# Patient Record
Sex: Male | Born: 1950 | Race: White | Hispanic: No | Marital: Married | Smoking: Former smoker
Health system: Southern US, Community
[De-identification: ages and names within clinical notes are randomized; demographics above are authoritative.]

## PROBLEM LIST (undated history)

## (undated) DIAGNOSIS — G5603 Carpal tunnel syndrome, bilateral upper limbs: Secondary | ICD-10-CM

## (undated) DIAGNOSIS — I10 Essential (primary) hypertension: Secondary | ICD-10-CM

## (undated) DIAGNOSIS — I219 Acute myocardial infarction, unspecified: Secondary | ICD-10-CM

## (undated) DIAGNOSIS — R413 Other amnesia: Secondary | ICD-10-CM

## (undated) DIAGNOSIS — R414 Neurologic neglect syndrome: Secondary | ICD-10-CM

## (undated) DIAGNOSIS — F0390 Unspecified dementia without behavioral disturbance: Secondary | ICD-10-CM

## (undated) DIAGNOSIS — M4802 Spinal stenosis, cervical region: Secondary | ICD-10-CM

## (undated) DIAGNOSIS — G20A1 Parkinson's disease without dyskinesia, without mention of fluctuations: Secondary | ICD-10-CM

## (undated) DIAGNOSIS — E785 Hyperlipidemia, unspecified: Secondary | ICD-10-CM

## (undated) HISTORY — DX: Acute myocardial infarction, unspecified: I21.9

## (undated) HISTORY — DX: Other amnesia: R41.3

## (undated) HISTORY — PX: ABDOMINAL SURGERY: SHX537

## (undated) HISTORY — DX: Hyperlipidemia, unspecified: E78.5

## (undated) HISTORY — PX: CARPAL TUNNEL RELEASE: SHX101

## (undated) HISTORY — DX: Essential (primary) hypertension: I10

---

## 1998-10-19 ENCOUNTER — Encounter: Payer: Self-pay | Admitting: *Deleted

## 1998-10-19 ENCOUNTER — Observation Stay (HOSPITAL_COMMUNITY): Admission: EM | Admit: 1998-10-19 | Discharge: 1998-10-20 | Payer: Self-pay | Admitting: *Deleted

## 2000-11-21 ENCOUNTER — Encounter: Payer: Self-pay | Admitting: Emergency Medicine

## 2000-11-21 ENCOUNTER — Emergency Department (HOSPITAL_COMMUNITY): Admission: EM | Admit: 2000-11-21 | Discharge: 2000-11-21 | Payer: Self-pay

## 2003-10-28 HISTORY — PX: CARDIAC CATHETERIZATION: SHX172

## 2004-07-24 ENCOUNTER — Inpatient Hospital Stay (HOSPITAL_COMMUNITY): Admission: EM | Admit: 2004-07-24 | Discharge: 2004-07-28 | Payer: Self-pay | Admitting: Emergency Medicine

## 2004-08-12 ENCOUNTER — Encounter (HOSPITAL_COMMUNITY): Admission: RE | Admit: 2004-08-12 | Discharge: 2004-11-10 | Payer: Self-pay | Admitting: Cardiology

## 2004-08-13 ENCOUNTER — Observation Stay (HOSPITAL_COMMUNITY): Admission: EM | Admit: 2004-08-13 | Discharge: 2004-08-13 | Payer: Self-pay | Admitting: Emergency Medicine

## 2004-08-26 ENCOUNTER — Ambulatory Visit (HOSPITAL_COMMUNITY): Admission: RE | Admit: 2004-08-26 | Discharge: 2004-08-26 | Payer: Self-pay | Admitting: Cardiology

## 2004-11-11 ENCOUNTER — Encounter (HOSPITAL_COMMUNITY): Admission: RE | Admit: 2004-11-11 | Discharge: 2005-02-09 | Payer: Self-pay | Admitting: Cardiology

## 2005-12-09 ENCOUNTER — Ambulatory Visit: Payer: Self-pay | Admitting: Gastroenterology

## 2006-01-09 ENCOUNTER — Ambulatory Visit: Payer: Self-pay | Admitting: Gastroenterology

## 2006-01-19 ENCOUNTER — Ambulatory Visit (HOSPITAL_COMMUNITY): Admission: RE | Admit: 2006-01-19 | Discharge: 2006-01-19 | Payer: Self-pay | Admitting: Gastroenterology

## 2006-01-19 ENCOUNTER — Ambulatory Visit: Payer: Self-pay | Admitting: Gastroenterology

## 2006-02-03 ENCOUNTER — Ambulatory Visit (HOSPITAL_COMMUNITY): Admission: RE | Admit: 2006-02-03 | Discharge: 2006-02-03 | Payer: Self-pay | Admitting: Gastroenterology

## 2008-12-12 ENCOUNTER — Ambulatory Visit: Payer: Self-pay | Admitting: Specialist

## 2011-09-29 ENCOUNTER — Other Ambulatory Visit: Payer: Self-pay | Admitting: Cardiology

## 2011-11-19 ENCOUNTER — Other Ambulatory Visit: Payer: Self-pay | Admitting: Cardiology

## 2012-05-24 ENCOUNTER — Other Ambulatory Visit: Payer: Self-pay | Admitting: Cardiology

## 2013-02-21 ENCOUNTER — Ambulatory Visit: Payer: Self-pay | Admitting: Family Medicine

## 2013-03-25 ENCOUNTER — Other Ambulatory Visit: Payer: Self-pay | Admitting: Specialist

## 2013-03-25 LAB — BODY FLUID CELL COUNT WITH DIFFERENTIAL
Basophil: 0 %
Eosinophil: 0 %
Lymphocytes: 21 %
Neutrophils: 6 %
Nucleated Cell Count: 101 /mm3
Other Cells BF: 0 %
Other Mononuclear Cells: 73 %

## 2013-03-25 LAB — SYNOVIAL FLUID, CRYSTAL: Crystals, Joint Fluid: NONE SEEN

## 2013-03-29 LAB — BODY FLUID CULTURE

## 2013-05-02 ENCOUNTER — Ambulatory Visit: Payer: Self-pay | Admitting: Orthopedic Surgery

## 2013-11-30 ENCOUNTER — Ambulatory Visit: Payer: Self-pay | Admitting: Orthopedic Surgery

## 2013-11-30 LAB — BASIC METABOLIC PANEL
Anion Gap: 3 — ABNORMAL LOW (ref 7–16)
BUN: 13 mg/dL (ref 7–18)
Calcium, Total: 9.4 mg/dL (ref 8.5–10.1)
Chloride: 103 mmol/L (ref 98–107)
Co2: 30 mmol/L (ref 21–32)
Creatinine: 0.85 mg/dL (ref 0.60–1.30)
EGFR (African American): >60
EGFR (Non-African Amer.): >60
Glucose: 98 mg/dL (ref 65–99)
Osmolality: 272 (ref 275–301)
Potassium: 3.7 mmol/L (ref 3.5–5.1)
Sodium: 136 mmol/L (ref 136–145)

## 2013-11-30 LAB — CBC
HCT: 42.4 % (ref 40.0–52.0)
HGB: 14.5 g/dL (ref 13.0–18.0)
MCH: 31 pg (ref 26.0–34.0)
MCHC: 34.3 g/dL (ref 32.0–36.0)
MCV: 90 fL (ref 80–100)
Platelet: 171 10*3/uL (ref 150–440)
RBC: 4.69 10*6/uL (ref 4.40–5.90)
RDW: 13.2 % (ref 11.5–14.5)
WBC: 4.1 10*3/uL (ref 3.8–10.6)

## 2013-11-30 LAB — PROTIME-INR
INR: 0.9
Prothrombin Time: 11.6 secs (ref 11.5–14.7)

## 2013-11-30 LAB — APTT: Activated PTT: 26.7 secs (ref 23.6–35.9)

## 2013-12-07 ENCOUNTER — Ambulatory Visit: Payer: Self-pay | Admitting: Orthopedic Surgery

## 2014-09-06 ENCOUNTER — Ambulatory Visit (INDEPENDENT_AMBULATORY_CARE_PROVIDER_SITE_OTHER): Payer: BC Managed Care – PPO | Admitting: Family Medicine

## 2014-09-06 ENCOUNTER — Encounter: Payer: Self-pay | Admitting: Family Medicine

## 2014-09-06 VITALS — BP 150/78 | HR 78 | Temp 98.0°F | Resp 16 | Ht 67.0 in | Wt 182.0 lb

## 2014-09-06 DIAGNOSIS — N3000 Acute cystitis without hematuria: Secondary | ICD-10-CM

## 2014-09-06 DIAGNOSIS — R8290 Unspecified abnormal findings in urine: Secondary | ICD-10-CM

## 2014-09-06 DIAGNOSIS — I10 Essential (primary) hypertension: Secondary | ICD-10-CM | POA: Insufficient documentation

## 2014-09-06 DIAGNOSIS — Z23 Encounter for immunization: Secondary | ICD-10-CM

## 2014-09-06 LAB — URINALYSIS, MICROSCOPIC ONLY
Casts: NONE SEEN
Crystals: NONE SEEN
Squamous Epithelial / HPF: NONE SEEN

## 2014-09-06 LAB — URINALYSIS, ROUTINE W REFLEX MICROSCOPIC
Bilirubin Urine: NEGATIVE
Glucose, UA: NEGATIVE mg/dL
Ketones, ur: NEGATIVE mg/dL
Nitrite: NEGATIVE
Protein, ur: NEGATIVE mg/dL
Specific Gravity, Urine: 1.01 (ref 1.005–1.030)
Urobilinogen, UA: 0.2 mg/dL (ref 0.0–1.0)
pH: 5.5 (ref 5.0–8.0)

## 2014-09-06 MED ORDER — CIPROFLOXACIN HCL 500 MG PO TABS: 500.0000 mg | ORAL_TABLET | Freq: Two times a day (BID) | ORAL | Status: DC

## 2014-09-06 NOTE — Addendum Note (Signed)
Addended by: Sheral Flow on: 09/06/2014 04:24 PM   Modules accepted: Orders

## 2014-09-06 NOTE — Progress Notes (Signed)
Patient ID: Jay Rivas, male   DOB: September 14, 1951, 63 y.o.   MRN: [id number]   Subjective:    Patient ID: Jay Rivas, male    DOB: May 25, 1951, 63 y.o.   MRN: [id number]  Patient presents for Possible UTI  Patient here with foul-smelling urine and changing color for the past 3 months on and off. He is not [place] office in quite a few years. He is followed by cardiology Dr. Nevada Crane one he. He denies any urinary pressure but he does have BPH states that he has had his prostate screening by urology. He does not take any medications for this. He has not seen any gross blood. He has no history of kidney stones. No abdominal pain or fever associated. He did try some over-the-counter bladder tablets.   Review Of Systems:  GEN- denies fatigue, fever, weight loss,weakness, recent illness HEENT- denies eye drainage, change in vision, nasal discharge, CVS- denies chest pain, palpitations RESP- denies SOB, cough, wheeze ABD- denies N/V, change in stools, abd pain GU- denies dysuria, hematuria, dribbling, incontinence MSK- denies joint pain, muscle aches, injury Neuro- denies headache, dizziness, syncope, seizure activity       Objective:    BP 150/78 mmHg  Pulse 78  Temp(Src) 98 F (36.7 C) (Oral)  Resp 16  Ht 5\' 7"  (1.702 m)  Wt 182 lb (82.555 kg)  BMI 28.50 kg/m2 GEN- NAD, alert and oriented x3, repeat BP 152/82 HEENT- PERRL, EOMI, non injected sclera, pink conjunctiva, MMM, oropharynx clear CVS- RRR, no murmur RESP-CTAB ABD-NABS,soft,NT,ND , no CVA tenderness         Assessment & Plan:      Problem List Items Addressed This Visit    None    Visit Diagnoses    Acute cystitis without hematuria    -  Primary    possible acute cystitis however with BPH could be component of prostatitis. Send urine for culture, start Cipro, small blood noted microscopically    Relevant Orders       Urine culture    Foul smelling urine        Relevant Orders       Urinalysis, Routine w reflex  microscopic (Completed)       Note: This dictation was prepared with Dragon dictation along with smaller phrase technology. Any transcriptional errors that result from this process are unintentional.

## 2014-09-06 NOTE — Assessment & Plan Note (Signed)
Elevated BP today, seen at cardiologist office recently BP was normal, he will monitor at home Obtain records from his cardiologist and last set of labs, I will not change meds today

## 2014-09-06 NOTE — Patient Instructions (Signed)
Take antibiotics as prescribed We will call with culture results  Release of records from Dr. Terrence Dupont  F/U as needed

## 2014-09-08 LAB — URINE CULTURE: Colony Count: >=100000

## 2015-02-17 NOTE — Op Note (Signed)
PATIENT NAME:  Jay Rivas, LANGILLE MR#:  [id number] DATE OF BIRTH:  1951/06/16  DATE OF PROCEDURE:  12/07/2013  PREOPERATIVE DIAGNOSIS: Right carpal tunnel syndrome.   POSTOPERATIVE DIAGNOSIS: Right carpal tunnel syndrome.   PROCEDURE: Right open carpal tunnel release.   ANESTHESIA: General.   SURGEON: [place] Park, M.D.   ESTIMATED BLOOD LOSS: Minimal.   COMPLICATIONS: None.   TOURNIQUET TIME: 39 minutes.   INDICATIONS FOR PROCEDURE: The patient is a 64 year old male who has had significant pain, numbness in the thumb, index and middle fingers, along with weakness of the right hand. He was confirmed to have carpal tunnel syndrome by nerve conduction study. He failed conservative management, including splinting, nonsteroidal anti-inflammatories and corticosteroid injection. The patient wished to proceed with surgery. He understood the risks included infection, bleeding, wound healing problems, nerve injury, recurrence of carpal tunnel syndrome, wrist stiffness, pillar pain and the need for further surgery. Medical risks he understood include myocardial infarction, stroke, pneumonia, respiratory failure and death. The patient understood that the reason to proceed with surgery was to avoid further deterioration of the condition of the median nerve. He understood that I could not guarantee full return of sensation and strength to the right hand.   PROCEDURE NOTE: The patient was met in the preoperative area. I marked the right wrist with the word yes according to the hospital's right site protocol. This was after verbally confirming with the patient that this was the correct side of surgery. The patient's wife was by the bedside. I answered all of his questions and updated his history and physical. The patient was then brought to the operating room where he was placed supine on the operative table. He underwent general anesthesia. His right arm was then prepped and draped in sterile fashion. A timeout  was performed to verify the patient's name, date of birth, medical record number, correct site of surgery and correct procedure to be performed. It was also used to confirm that the patient had received antibiotics and all appropriate instruments, implants and radiographic studies were available in the room. Once all in attendance were in agreement, the case began.   The patient had his right arm Esmarched. A tourniquet had been applied to his right upper arm. This was inflated to 250 mmHg. The patient then had a towel roll placed under his right wrist. A lead hand was used to keep his fingers into extension. Kaplan's cardinal line along with a line extended from the web space to the middle and ring fingers were drawn on this patient's palm. This allowed for surgical incision planning. His palmar crease was incorporated into the incision made. Once the incision was drawn out, a #15 blade was used to make the skin incision. The subcutaneous tissues were then carefully dissected using a small Metzenbaum scissors and an Adson pick-up. The distal edge of the transverse carpal ligament was then identified along with the palmar fascia. A Freer elevator was placed underneath the transverse carpal ligament running distally to proximally. A micro Beaver blade was then used to carefully incise the transverse carpal ligament, taking care to stay on top of the Soil scientist. A small self-retaining retractor had been placed in the incision to hold open the skin edges for better visualization of the transverse carpal ligament. Once the transverse carpal ligament was incised, both the proximal and distal edges of the ligament were easily visualized and were completely released. The median nerve was inspected and found have a slight indentation from where the transverse  carpal ligament had been compressing it. Otherwise, the nerve was in good condition. Care was taken to watch for the recurrent motor branch of the median nerve  during release of the transverse carpal ligament. The recurrent motor branch was not seen to be exiting through the transverse carpal ligament during the case. The wound was then copiously irrigated. The skin was approximated using 4-0 nylon in an interrupted fashion. A dry sterile dressing was applied. Fluffs were placed between all fingers. A volar splint was then placed on the palmar side of the wrist and the entire bandage was covered with a bias cut bandage. The patient was then awakened and brought to the PACU in stable condition. I was scrubbed and present for the entire case, and all sharp and instrument counts were correct at the conclusion of the case. I spoke with the patient's wife in the postop consultation room to let her know the case had gone without complication and the patient was stable in the recovery room. The patient had the ability to flex and extend all 5 digits. His fingers were well perfused, and he had intact sensation in all 5 digits to light touch. He could flex his FPL, FDS and FDP in all digits.    ____________________________ Timoteo Gaul, MD klk:gb D: 12/07/2013 22:20:33 ET T: 12/07/2013 22:59:44 ET JOB#: [id number]  cc: Timoteo Gaul, MD, <Dictator> Timoteo Gaul MD ELECTRONICALLY SIGNED 12/08/2013 10:44

## 2015-12-19 ENCOUNTER — Encounter: Payer: Self-pay | Admitting: Gastroenterology

## 2016-08-29 ENCOUNTER — Encounter: Payer: Self-pay | Admitting: Family Medicine

## 2016-08-29 ENCOUNTER — Other Ambulatory Visit: Payer: Self-pay | Admitting: Family Medicine

## 2016-08-29 ENCOUNTER — Ambulatory Visit (INDEPENDENT_AMBULATORY_CARE_PROVIDER_SITE_OTHER): Payer: BLUE CROSS/BLUE SHIELD | Admitting: Family Medicine

## 2016-08-29 ENCOUNTER — Ambulatory Visit (HOSPITAL_COMMUNITY)
Admission: RE | Admit: 2016-08-29 | Discharge: 2016-08-29 | Disposition: A | Discharge status: Home / Self Care | Payer: BLUE CROSS/BLUE SHIELD | Source: Ambulatory Visit | Attending: Family Medicine | Admitting: Family Medicine

## 2016-08-29 VITALS — BP 128/74 | HR 72 | Temp 98.0°F | Resp 14 | Ht 67.0 in | Wt 178.0 lb

## 2016-08-29 DIAGNOSIS — N5082 Scrotal pain: Secondary | ICD-10-CM | POA: Diagnosis not present

## 2016-08-29 DIAGNOSIS — N5089 Other specified disorders of the male genital organs: Secondary | ICD-10-CM | POA: Diagnosis not present

## 2016-08-29 DIAGNOSIS — N432 Other hydrocele: Secondary | ICD-10-CM | POA: Diagnosis not present

## 2016-08-29 DIAGNOSIS — K4090 Unilateral inguinal hernia, without obstruction or gangrene, not specified as recurrent: Secondary | ICD-10-CM | POA: Diagnosis not present

## 2016-08-29 DIAGNOSIS — N451 Epididymitis: Secondary | ICD-10-CM

## 2016-08-29 DIAGNOSIS — N390 Urinary tract infection, site not specified: Secondary | ICD-10-CM

## 2016-08-29 DIAGNOSIS — N433 Hydrocele, unspecified: Secondary | ICD-10-CM | POA: Diagnosis not present

## 2016-08-29 LAB — URINALYSIS, ROUTINE W REFLEX MICROSCOPIC
Bilirubin Urine: NEGATIVE
Glucose, UA: NEGATIVE
Nitrite: NEGATIVE
Specific Gravity, Urine: 1.02 (ref 1.001–1.035)
pH: 5.5 (ref 5.0–8.0)

## 2016-08-29 LAB — URINALYSIS, MICROSCOPIC ONLY
Crystals: NONE SEEN [HPF]
Yeast: NONE SEEN [HPF]

## 2016-08-29 MED ORDER — CIPROFLOXACIN HCL 500 MG PO TABS: 500.0000 mg | ORAL_TABLET | Freq: Two times a day (BID) | ORAL | 0 refills | Status: DC

## 2016-08-29 MED ORDER — LEVOFLOXACIN 500 MG PO TABS: 500.0000 mg | ORAL_TABLET | Freq: Every day | ORAL | 0 refills | Status: DC

## 2016-08-29 NOTE — Patient Instructions (Signed)
Take antibiotics as prescribed Ultrasound to be done  F/U pending results

## 2016-08-29 NOTE — Progress Notes (Signed)
   Subjective:    Patient ID: Jay Rivas, male    DOB: 09-Jan-1951, 65 y.o.   MRN: [id number]  Patient presents for Testicular Edema (x1 week- R testicle swelling- reports that he has been taking OTC pain reliver- had illness last week- states that he does have some mild painin inguinal area on R side) and Dysuria (x1 week- reports slight pain with urination) Patient here with testicular swelling that started 4 days ago. He states that he did lift a heavy motor up into her truck but he does not really pain at that time. He has had some discomfort right into the right groin region. The past few days he then started having some burning with urination denies any discharge from the penis. He took over-the-counter pain relievers which is helped the pain in his right scrotal area. Denies any hematuria  He does occasional strain with BM   No previous scrotal swelling   Review Of Systems: per above   GEN- denies fatigue, fever, weight loss,weakness, recent illness HEENT- denies eye drainage, change in vision, nasal discharge, CVS- denies chest pain, palpitations RESP- denies SOB, cough, wheeze ABD- denies N/V, change in stools, abd pain GU-+dysuria, denies hematuria, dribbling, incontinence MSK- denies joint pain, muscle aches, injury Neuro- denies headache, dizziness, syncope, seizure activity       Objective:    BP 128/74 (BP Location: Left Arm, Patient Position: Sitting, Cuff Size: Normal)   Pulse 72   Temp 98 F (36.7 C) (Oral)   Resp 14   Ht 5\' 7"  (1.702 m)   Wt 178 lb (80.7 kg)   SpO2 98%   BMI 27.88 kg/m  GEN- NAD, alert and oriented x3 HEENT- PERRL, EOMI, non injected sclera, pink conjunctiva, MMM, oropharynx clear Neck- Supple, no thyromegaly CVS- RRR, no murmur RESP-CTAB ABD-NABS,soft,NT,ND, no CVA tenderness GU- no penile discharge, large swelling right scrotum, testes descended bilat, swelling into right mons pubis , TTP  Pulses- Radial  2+        Assessment &  Plan:      Problem List Items Addressed This Visit    None    Visit Diagnoses    Urinary tract infection without hematuria, site unspecified    -  Primary   Relevant Orders   Urinalysis, Routine w reflex microscopic (not at [facility] (Completed)   Urine culture   Scrotal swelling       DD include infection/ torison, inguinal hernia with swelling in mons pubis and inguinal canal region. Obtain STAT Ultrasound, plan to start Cipro.    [place] reviewed epidydymitis/orchitis and hydrocele seen.   Take antibiotisc, culture sent, schedule with urology for the large hydrocele    Relevant Orders   US Scrotum (Completed)   Right inguinal hernia       Relevant Orders   US Scrotum (Completed)    Epidydymitis - seen on [place]    Note: This dictation was prepared with Dragon dictation along with smaller phrase technology. Any transcriptional errors that result from this process are unintentional.

## 2016-09-01 LAB — URINE CULTURE

## 2016-09-02 ENCOUNTER — Telehealth: Payer: Self-pay

## 2016-09-02 NOTE — Telephone Encounter (Signed)
PT called to confirm referral was put in. Explained to pt referral was put in 11-3

## 2016-09-03 ENCOUNTER — Telehealth: Payer: Self-pay | Admitting: Family Medicine

## 2016-09-03 NOTE — Telephone Encounter (Signed)
Patient calling to check the status of his urology referral  [phone]

## 2016-09-04 NOTE — Telephone Encounter (Signed)
I did call [facility].  They do have referral and will be contacting patient to make referral.  LMTCB

## 2017-08-28 DIAGNOSIS — I2510 Atherosclerotic heart disease of native coronary artery without angina pectoris: Secondary | ICD-10-CM | POA: Diagnosis not present

## 2017-08-28 DIAGNOSIS — I252 Old myocardial infarction: Secondary | ICD-10-CM | POA: Diagnosis not present

## 2017-08-28 DIAGNOSIS — I10 Essential (primary) hypertension: Secondary | ICD-10-CM | POA: Diagnosis not present

## 2017-08-28 DIAGNOSIS — E785 Hyperlipidemia, unspecified: Secondary | ICD-10-CM | POA: Diagnosis not present

## 2017-08-28 DIAGNOSIS — R7309 Other abnormal glucose: Secondary | ICD-10-CM | POA: Diagnosis not present

## 2017-09-29 ENCOUNTER — Encounter: Payer: Self-pay | Admitting: Family Medicine

## 2017-09-29 ENCOUNTER — Ambulatory Visit (INDEPENDENT_AMBULATORY_CARE_PROVIDER_SITE_OTHER): Payer: Medicare Other | Admitting: Family Medicine

## 2017-09-29 VITALS — BP 130/86 | HR 78 | Temp 97.9°F | Resp 12 | Ht 67.0 in | Wt 178.0 lb

## 2017-09-29 DIAGNOSIS — R4789 Other speech disturbances: Secondary | ICD-10-CM | POA: Diagnosis not present

## 2017-09-29 DIAGNOSIS — R2689 Other abnormalities of gait and mobility: Secondary | ICD-10-CM

## 2017-09-29 DIAGNOSIS — R413 Other amnesia: Secondary | ICD-10-CM

## 2017-09-29 NOTE — Progress Notes (Signed)
Subjective:    Patient ID: Jay Rivas, male    DOB: Mar 14, 1951, 66 y.o.   MRN: [id number]  HPI Patient is a very pleasant 66 year old Caucasian male with a past medical history of coronary artery disease status post myocardial infarction in the remote past as well as hypertension who presents today with his wife for memory problems.  There seems to have been a sudden change approximately 4 months ago.  Around that time, the patient and the patient's wife noticed word finding difficulties while speaking.  He will frequently forget or be unable to complete his sentences.  He will know what he wants to say but have a difficult time saying the word.  He also reports a difficulty naming objects and forgetting the name of common items.  Around the same time, the patient has noticed generalized memory problems.  He frequently forgets items loses his keys, loses wallet.  He is still able to manage his activities of daily living and independent activities of daily living.  He denies any episodes of forgetting to turn off the stove or forgetting to pay a bill..  However there have been also a other neurologic symptom.  He is experience myoclonic jerks in his right upper extremities.  He is lost the dexterity in his right hand.  He has been diagnosed with carpal tunnel syndrome and is even had surgery for this however he has not regained the strength in his right hand and reports numbness and tingling in all of his fingertips on the right hand along with weakness in the right arm.  On neurologic exam today, cranial nerves II through XII are grossly intact with muscle strength 5/5 equal and symmetric in all extremities except for the right arm.  In the right arm, he has lost the fine motor control in his right hand and a sensation in his right hand.  He also has some mild weakness with elbow flexion and extension.  On cerebellar testing, the patient has a difficult time with finger to nose testing.  He also has a  difficult time with heel to toe gait testing.  Mini-Mental status exam was performed today in the office.  Patient incorrectly states that the year is 2009.  He is able to correctly name the month but not the day of the week nor the date.  He is able to score 5 out of 5 in location.  After 3 chances, the patient is able to recall 3 items.  5 minutes later, he is only able to recall 2 out of the 3 items.  He is unable to perform serial sevens and does not even attempt to perform serial sevens.  He is also unable to spell world in reverse.  On clock drawing, the patient is unable to draw all a clock face with the correct time.  Furthermore he has a noticeable essential tremor with a very misshapen clock face due to the tremor.  He is also unable to look at a clock that I have drawn for him and tell me the correct time.  Patient has 1/10 grade education and worked in Architect.  This is a noticeable decline in his baseline mental status. Past Medical History:  Diagnosis Date  . Hypertension   . Myocardial infarction [facility])    Dr. Terrence Dupont   No past surgical history on file. Current Outpatient Medications on File Prior to Visit  Medication Sig Dispense Refill  . aspirin EC 81 MG tablet Take 81 mg by  mouth daily.    Marland Kitchen atorvastatin (LIPITOR) 10 MG tablet   1  . losartan (COZAAR) 25 MG tablet Take 25 mg by mouth daily.    . metoprolol succinate (TOPROL-XL) 25 MG 24 hr tablet   1   No current facility-administered medications on file prior to visit.    No Known Allergies Social History   Socioeconomic History  . Marital status: Married    Spouse name: Not on file  . Number of children: Not on file  . Years of education: Not on file  . Highest education level: Not on file  Social Needs  . Financial resource strain: Not on file  . Food insecurity - worry: Not on file  . Food insecurity - inability: Not on file  . Transportation needs - medical: Not on file  . Transportation needs - non-medical:  Not on file  Occupational History  . Not on file  Tobacco Use  . Smoking status: Former Research scientist (life sciences)  . Smokeless tobacco: Former Systems developer  . Tobacco comment: quit 1990  Substance and Sexual Activity  . Alcohol use: Yes    Alcohol/week: 14.4 oz    Types: 24 Cans of beer per week  . Drug use: No  . Sexual activity: Yes  Other Topics Concern  . Not on file  Social History Narrative  . Not on file   No family history on file.     Review of Systems  All other systems reviewed and are negative.      Objective:   Physical Exam  Constitutional: He is oriented to person, place, and time. He appears well-developed and well-nourished. No distress.  HENT:  Head: Normocephalic and atraumatic.  Right Ear: External ear normal.  Left Ear: External ear normal.  Nose: Nose normal.  Mouth/Throat: Oropharynx is clear and moist. No oropharyngeal exudate.  Eyes: Conjunctivae and EOM are normal. Pupils are equal, round, and reactive to light. Right eye exhibits no discharge. Left eye exhibits no discharge. No scleral icterus.  Neck: Normal range of motion. Neck supple. No JVD present. No thyromegaly present.  Cardiovascular: Normal rate, regular rhythm, normal heart sounds and intact distal pulses. Exam reveals no gallop and no friction rub.  No murmur heard. Pulmonary/Chest: Effort normal and breath sounds normal. No respiratory distress. He has no wheezes. He has no rales. He exhibits no tenderness.  Abdominal: Soft. Bowel sounds are normal. He exhibits no distension and no mass. There is no tenderness. There is no rebound and no guarding.  Lymphadenopathy:    He has no cervical adenopathy.  Neurological: He is alert and oriented to person, place, and time. He has normal reflexes. He displays normal reflexes. No cranial nerve deficit. He exhibits abnormal muscle tone. Coordination abnormal.  Skin: He is not diaphoretic.  Psychiatric: He has a normal mood and affect. His behavior is normal. Judgment  and thought content normal.  Vitals reviewed.         Assessment & Plan:  Memory loss - Plan: Vitamin B12, TSH, COMPLETE METABOLIC PANEL WITH GFR, CBC with Differential/Platelet, MR Brain W Wo Contrast  Word finding difficulty - Plan: MR Brain W Wo Contrast  Impairment of balance - Plan: MR Brain W Wo Contrast  Patient has noticed a noticeable decline in his mental status over the last 4 months.  Given the word finding difficulties and speech difficulties, I am concerned about possible ischemic stroke in the left hemisphere.  I am also concerned about possible ischemic stroke or underlying cerebral  pathology in the left hemisphere due to the loss of dexterity in his right hand, the weakness in his right arm, along with myoclonic jerks in his right arm.  Therefore I am not comfortable simply calling this dementia/Alzheimer's dementia.  I believe a more thorough diagnostic workup needs to take place to rule out underlying vascular issues in the brain particularly given his previous history of myocardial infarction.  Therefore I will schedule the patient for an MRI of the brain.  I will also check a vitamin B12 level, TSH, CMP, and a CBC.  I will await the results of his lab studies along with MRI prior to determining the next course of treatment between medication such as Aricept and/or Namenda versus focusing on secondary prevention of future CVA.

## 2017-09-30 LAB — CBC WITH DIFFERENTIAL/PLATELET
Basophils Absolute: 30 cells/uL (ref 0–200)
Basophils Relative: 0.6 %
Eosinophils Absolute: 280 cells/uL (ref 15–500)
Eosinophils Relative: 5.6 %
HCT: 38.1 % — ABNORMAL LOW (ref 38.5–50.0)
Hemoglobin: 13.6 g/dL (ref 13.2–17.1)
Lymphs Abs: 795 cells/uL — ABNORMAL LOW (ref 850–3900)
MCH: 31.1 pg (ref 27.0–33.0)
MCHC: 35.7 g/dL (ref 32.0–36.0)
MCV: 87.2 fL (ref 80.0–100.0)
MPV: 9.4 fL (ref 7.5–12.5)
Monocytes Relative: 7.6 %
Neutro Abs: 3515 cells/uL (ref 1500–7800)
Neutrophils Relative %: 70.3 %
Platelets: 189 10*3/uL (ref 140–400)
RBC: 4.37 10*6/uL (ref 4.20–5.80)
RDW: 12.8 % (ref 11.0–15.0)
Total Lymphocyte: 15.9 %
WBC mixed population: 380 cells/uL (ref 200–950)
WBC: 5 10*3/uL (ref 3.8–10.8)

## 2017-09-30 LAB — COMPLETE METABOLIC PANEL WITH GFR
AG Ratio: 2 (calc) (ref 1.0–2.5)
ALT: 24 U/L (ref 9–46)
AST: 23 U/L (ref 10–35)
Albumin: 4.6 g/dL (ref 3.6–5.1)
Alkaline phosphatase (APISO): 61 U/L (ref 40–115)
BUN: 11 mg/dL (ref 7–25)
CO2: 28 mmol/L (ref 20–32)
Calcium: 9.8 mg/dL (ref 8.6–10.3)
Chloride: 101 mmol/L (ref 98–110)
Creat: 0.92 mg/dL (ref 0.70–1.25)
GFR, Est African American: 101 mL/min/{1.73_m2} (ref >=60)
GFR, Est Non African American: 87 mL/min/{1.73_m2} (ref >=60)
Globulin: 2.3 g/dL (calc) (ref 1.9–3.7)
Glucose, Bld: 93 mg/dL (ref 65–99)
Potassium: 3.9 mmol/L (ref 3.5–5.3)
Sodium: 140 mmol/L (ref 135–146)
Total Bilirubin: 0.8 mg/dL (ref 0.2–1.2)
Total Protein: 6.9 g/dL (ref 6.1–8.1)

## 2017-09-30 LAB — VITAMIN B12: Vitamin B-12: 342 pg/mL (ref 200–1100)

## 2017-09-30 LAB — TSH: TSH: 1.92 mIU/L (ref 0.40–4.50)

## 2017-10-08 ENCOUNTER — Ambulatory Visit
Admission: RE | Admit: 2017-10-08 | Discharge: 2017-10-08 | Disposition: A | Discharge status: Home / Self Care | Payer: Medicare Other | Source: Ambulatory Visit | Attending: Family Medicine | Admitting: Family Medicine

## 2017-10-08 DIAGNOSIS — R2689 Other abnormalities of gait and mobility: Secondary | ICD-10-CM

## 2017-10-08 DIAGNOSIS — R413 Other amnesia: Secondary | ICD-10-CM

## 2017-10-08 DIAGNOSIS — R410 Disorientation, unspecified: Secondary | ICD-10-CM | POA: Diagnosis not present

## 2017-10-08 DIAGNOSIS — R4789 Other speech disturbances: Secondary | ICD-10-CM

## 2017-10-08 MED ORDER — GADOBENATE DIMEGLUMINE 529 MG/ML IV SOLN
16.0000 mL | Freq: Once | INTRAVENOUS | Status: AC | PRN
  Administered 2017-10-08: 16 mL via INTRAVENOUS

## 2017-10-14 DIAGNOSIS — M65331 Trigger finger, right middle finger: Secondary | ICD-10-CM | POA: Diagnosis not present

## 2017-10-14 DIAGNOSIS — M542 Cervicalgia: Secondary | ICD-10-CM | POA: Diagnosis not present

## 2017-10-14 DIAGNOSIS — M65351 Trigger finger, right little finger: Secondary | ICD-10-CM | POA: Diagnosis not present

## 2017-10-14 DIAGNOSIS — G5603 Carpal tunnel syndrome, bilateral upper limbs: Secondary | ICD-10-CM | POA: Diagnosis not present

## 2017-10-14 DIAGNOSIS — M65341 Trigger finger, right ring finger: Secondary | ICD-10-CM | POA: Insufficient documentation

## 2017-10-22 ENCOUNTER — Encounter: Payer: Self-pay | Admitting: Family Medicine

## 2017-10-22 ENCOUNTER — Ambulatory Visit (INDEPENDENT_AMBULATORY_CARE_PROVIDER_SITE_OTHER): Payer: Medicare Other | Admitting: Family Medicine

## 2017-10-22 VITALS — BP 144/90 | HR 70 | Temp 98.1°F | Resp 14 | Ht 67.0 in | Wt 174.0 lb

## 2017-10-22 DIAGNOSIS — F0390 Unspecified dementia without behavioral disturbance: Secondary | ICD-10-CM | POA: Diagnosis not present

## 2017-10-22 MED ORDER — DONEPEZIL HCL 5 MG PO TABS: 5.0000 mg | ORAL_TABLET | Freq: Every day | ORAL | 5 refills | Status: DC

## 2017-10-22 NOTE — Progress Notes (Signed)
Subjective:    Patient ID: Jay Rivas, male    DOB: 1950/11/14, 66 y.o.   MRN: [id number]  HPI  09/29/17 Patient is a very pleasant 66 year old Caucasian male with a past medical history of coronary artery disease status post myocardial infarction in the remote past as well as hypertension who presents today with his wife for memory problems.  There seems to have been a sudden change approximately 4 months ago.  Around that time, the patient and the patient's wife noticed word finding difficulties while speaking.  He will frequently forget or be unable to complete his sentences.  He will know what he wants to say but have a difficult time saying the word.  He also reports a difficulty naming objects and forgetting the name of common items.  Around the same time, the patient has noticed generalized memory problems.  He frequently forgets items loses his keys, loses wallet.  He is still able to manage his activities of daily living and independent activities of daily living.  He denies any episodes of forgetting to turn off the stove or forgetting to pay a bill..  However there have been also a other neurologic symptom.  He is experience myoclonic jerks in his right upper extremities.  He is lost the dexterity in his right hand.  He has been diagnosed with carpal tunnel syndrome and is even had surgery for this however he has not regained the strength in his right hand and reports numbness and tingling in all of his fingertips on the right hand along with weakness in the right arm.  On neurologic exam today, cranial nerves II through XII are grossly intact with muscle strength 5/5 equal and symmetric in all extremities except for the right arm.  In the right arm, he has lost the fine motor control in his right hand and a sensation in his right hand.  He also has some mild weakness with elbow flexion and extension.  On cerebellar testing, the patient has a difficult time with finger to nose testing.  He also  has a difficult time with heel to toe gait testing.  Mini-Mental status exam was performed today in the office.  Patient incorrectly states that the year is 2009.  He is able to correctly name the month but not the day of the week nor the date.  He is able to score 5 out of 5 in location.  After 3 chances, the patient is able to recall 3 items.  5 minutes later, he is only able to recall 2 out of the 3 items.  He is unable to perform serial sevens and does not even attempt to perform serial sevens.  He is also unable to spell world in reverse.  On clock drawing, the patient is unable to draw all a clock face with the correct time.  Furthermore he has a noticeable essential tremor with a very misshapen clock face due to the tremor.  He is also unable to look at a clock that I have drawn for him and tell me the correct time.  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  This is a noticeable decline in his baseline mental status.  At that time, my plan was: Patient has noticed a noticeable decline in his mental status over the last 4 months.  Given the word finding difficulties and speech difficulties, I am concerned about possible ischemic stroke in the left hemisphere.  I am also concerned about possible ischemic  stroke or underlying cerebral pathology in the left hemisphere due to the loss of dexterity in his right hand, the weakness in his right arm, along with myoclonic jerks in his right arm.  Therefore I am not comfortable simply calling this dementia/Alzheimer's dementia.  I believe a more thorough diagnostic workup needs to take place to rule out underlying vascular issues in the brain particularly given his previous history of myocardial infarction.  Therefore I will schedule the patient for an MRI of the brain.  I will also check a vitamin B12 level, TSH, CMP, and a CBC.  I will await the results of his lab studies along with MRI prior to determining the next course of treatment between  medication such as Aricept and/or Namenda versus focusing on secondary prevention of future CVA.  10/22/17 Lab work showed a normal TSH. It showed a normal vitamin B12 level. MRI revealed moderate generalized atrophy of the brain but no evidence of stroke, hemorrhage, or brain tumor. Workup to date has been concerning for dementia, most likely Alzheimer's type. They're here today to discuss further Past Medical History:  Diagnosis Date  . Hypertension   . Myocardial infarction [facility])    Dr. Terrence Dupont   No past surgical history on file. Current Outpatient Medications on File Prior to Visit  Medication Sig Dispense Refill  . aspirin EC 81 MG tablet Take 81 mg by mouth daily.    Marland Kitchen atorvastatin (LIPITOR) 10 MG tablet   1  . losartan (COZAAR) 25 MG tablet Take 25 mg by mouth daily.    . metoprolol succinate (TOPROL-XL) 25 MG 24 hr tablet   1   No current facility-administered medications on file prior to visit.    No Known Allergies Social History   Socioeconomic History  . Marital status: Married    Spouse name: Not on file  . Number of children: Not on file  . Years of education: Not on file  . Highest education level: Not on file  Social Needs  . Financial resource strain: Not on file  . Food insecurity - worry: Not on file  . Food insecurity - inability: Not on file  . Transportation needs - medical: Not on file  . Transportation needs - non-medical: Not on file  Occupational History  . Not on file  Tobacco Use  . Smoking status: Former Research scientist (life sciences)  . Smokeless tobacco: Former Systems developer  . Tobacco comment: quit 1990  Substance and Sexual Activity  . Alcohol use: Yes    Alcohol/week: 14.4 oz    Types: 24 Cans of beer per week  . Drug use: No  . Sexual activity: Yes  Other Topics Concern  . Not on file  Social History Narrative  . Not on file   No family history on file.     Review of Systems  All other systems reviewed and are negative.      Objective:   Physical Exam    Constitutional: He is oriented to person, place, and time. He appears well-developed and well-nourished. No distress.  HENT:  Head: Normocephalic and atraumatic.  Right Ear: External ear normal.  Left Ear: External ear normal.  Nose: Nose normal.  Mouth/Throat: Oropharynx is clear and moist. No oropharyngeal exudate.  Eyes: Conjunctivae and EOM are normal. Pupils are equal, round, and reactive to light. Right eye exhibits no discharge. Left eye exhibits no discharge. No scleral icterus.  Neck: Normal range of motion. Neck supple. No JVD present. No thyromegaly present.  Cardiovascular: Normal  rate, regular rhythm, normal heart sounds and intact distal pulses. Exam reveals no gallop and no friction rub.  No murmur heard. Pulmonary/Chest: Effort normal and breath sounds normal. No respiratory distress. He has no wheezes. He has no rales. He exhibits no tenderness.  Abdominal: Soft. Bowel sounds are normal. He exhibits no distension and no mass. There is no tenderness. There is no rebound and no guarding.  Lymphadenopathy:    He has no cervical adenopathy.  Neurological: He is alert and oriented to person, place, and time. He has normal reflexes. No cranial nerve deficit. He exhibits abnormal muscle tone. Coordination abnormal.  Skin: He is not diaphoretic.  Psychiatric: He has a normal mood and affect. His behavior is normal. Judgment and thought content normal.  Vitals reviewed.         Assessment & Plan:  Dementia without behavioral disturbance, unspecified dementia type - Plan: donepezil (ARICEPT) 5 MG tablet, Ambulatory referral to Neurology  I have recommended Aricept 5 mg a day and recheck in one month. I suspect that he has early signs of Alzheimer's dementia. Patient's family would like to follow-up with neurology for a second opinion which I will happily arrange. I would recommend increasing the dose of Aricept as tolerated and memory loss worsens, adding Namenda to prevent  further deterioration as long as possible. Spent more than 20 minutes with the family today discussing this. I will gladly consult neurology for a second opinion per their request

## 2017-10-30 DIAGNOSIS — G5623 Lesion of ulnar nerve, bilateral upper limbs: Secondary | ICD-10-CM | POA: Diagnosis not present

## 2017-10-30 DIAGNOSIS — M65331 Trigger finger, right middle finger: Secondary | ICD-10-CM | POA: Diagnosis not present

## 2017-10-30 DIAGNOSIS — M5412 Radiculopathy, cervical region: Secondary | ICD-10-CM | POA: Diagnosis not present

## 2017-10-30 DIAGNOSIS — G8929 Other chronic pain: Secondary | ICD-10-CM | POA: Diagnosis not present

## 2017-10-30 DIAGNOSIS — M65351 Trigger finger, right little finger: Secondary | ICD-10-CM | POA: Diagnosis not present

## 2017-10-30 DIAGNOSIS — G5603 Carpal tunnel syndrome, bilateral upper limbs: Secondary | ICD-10-CM | POA: Diagnosis not present

## 2017-10-30 DIAGNOSIS — M65341 Trigger finger, right ring finger: Secondary | ICD-10-CM | POA: Diagnosis not present

## 2017-11-18 DIAGNOSIS — R7309 Other abnormal glucose: Secondary | ICD-10-CM | POA: Diagnosis not present

## 2017-11-18 DIAGNOSIS — I10 Essential (primary) hypertension: Secondary | ICD-10-CM | POA: Diagnosis not present

## 2017-11-18 DIAGNOSIS — I252 Old myocardial infarction: Secondary | ICD-10-CM | POA: Diagnosis not present

## 2017-11-18 DIAGNOSIS — I2510 Atherosclerotic heart disease of native coronary artery without angina pectoris: Secondary | ICD-10-CM | POA: Diagnosis not present

## 2017-11-18 DIAGNOSIS — E785 Hyperlipidemia, unspecified: Secondary | ICD-10-CM | POA: Diagnosis not present

## 2017-11-20 ENCOUNTER — Encounter: Payer: Self-pay | Admitting: Family Medicine

## 2017-11-20 ENCOUNTER — Ambulatory Visit (INDEPENDENT_AMBULATORY_CARE_PROVIDER_SITE_OTHER): Payer: Medicare Other | Admitting: Family Medicine

## 2017-11-20 VITALS — BP 124/68 | HR 60 | Temp 98.3°F | Resp 14 | Wt 178.0 lb

## 2017-11-20 DIAGNOSIS — G5603 Carpal tunnel syndrome, bilateral upper limbs: Secondary | ICD-10-CM | POA: Diagnosis not present

## 2017-11-20 DIAGNOSIS — R4789 Other speech disturbances: Secondary | ICD-10-CM | POA: Diagnosis not present

## 2017-11-20 DIAGNOSIS — F0390 Unspecified dementia without behavioral disturbance: Secondary | ICD-10-CM | POA: Diagnosis not present

## 2017-11-20 DIAGNOSIS — M542 Cervicalgia: Secondary | ICD-10-CM | POA: Diagnosis not present

## 2017-11-20 MED ORDER — DONEPEZIL HCL 10 MG PO TABS: 10.0000 mg | ORAL_TABLET | Freq: Every day | ORAL | 5 refills | Status: DC

## 2017-11-20 NOTE — Progress Notes (Signed)
Subjective:    Patient ID: Jay Rivas, male    DOB: 1950/11/14, 67 y.o.   MRN: [id number]  HPI  09/29/17 Patient is a very pleasant 67 year old Caucasian male with a past medical history of coronary artery disease status post myocardial infarction in the remote past as well as hypertension who presents today with his wife for memory problems.  There seems to have been a sudden change approximately 4 months ago.  Around that time, the patient and the patient's wife noticed word finding difficulties while speaking.  He will frequently forget or be unable to complete his sentences.  He will know what he wants to say but have a difficult time saying the word.  He also reports a difficulty naming objects and forgetting the name of common items.  Around the same time, the patient has noticed generalized memory problems.  He frequently forgets items loses his keys, loses wallet.  He is still able to manage his activities of daily living and independent activities of daily living.  He denies any episodes of forgetting to turn off the stove or forgetting to pay a bill..  However there have been also a other neurologic symptom.  He is experience myoclonic jerks in his right upper extremities.  He is lost the dexterity in his right hand.  He has been diagnosed with carpal tunnel syndrome and is even had surgery for this however he has not regained the strength in his right hand and reports numbness and tingling in all of his fingertips on the right hand along with weakness in the right arm.  On neurologic exam today, cranial nerves II through XII are grossly intact with muscle strength 5/5 equal and symmetric in all extremities except for the right arm.  In the right arm, he has lost the fine motor control in his right hand and a sensation in his right hand.  He also has some mild weakness with elbow flexion and extension.  On cerebellar testing, the patient has a difficult time with finger to nose testing.  He also  has a difficult time with heel to toe gait testing.  Mini-Mental status exam was performed today in the office.  Patient incorrectly states that the year is 2009.  He is able to correctly name the month but not the day of the week nor the date.  He is able to score 5 out of 5 in location.  After 3 chances, the patient is able to recall 3 items.  5 minutes later, he is only able to recall 2 out of the 3 items.  He is unable to perform serial sevens and does not even attempt to perform serial sevens.  He is also unable to spell world in reverse.  On clock drawing, the patient is unable to draw all a clock face with the correct time.  Furthermore he has a noticeable essential tremor with a very misshapen clock face due to the tremor.  He is also unable to look at a clock that I have drawn for him and tell me the correct time.  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  This is a noticeable decline in his baseline mental status.  At that time, my plan was: Patient has noticed a noticeable decline in his mental status over the last 4 months.  Given the word finding difficulties and speech difficulties, I am concerned about possible ischemic stroke in the left hemisphere.  I am also concerned about possible ischemic  stroke or underlying cerebral pathology in the left hemisphere due to the loss of dexterity in his right hand, the weakness in his right arm, along with myoclonic jerks in his right arm.  Therefore I am not comfortable simply calling this dementia/Alzheimer's dementia.  I believe a more thorough diagnostic workup needs to take place to rule out underlying vascular issues in the brain particularly given his previous history of myocardial infarction.  Therefore I will schedule the patient for an MRI of the brain.  I will also check a vitamin B12 level, TSH, CMP, and a CBC.  I will await the results of his lab studies along with MRI prior to determining the next course of treatment between  medication such as Aricept and/or Namenda versus focusing on secondary prevention of future CVA.  10/22/17 Lab work showed a normal TSH. It showed a normal vitamin B12 level. MRI revealed moderate generalized atrophy of the brain but no evidence of stroke, hemorrhage, or brain tumor. Workup to date has been concerning for dementia, most likely Alzheimer's type. They're here today to discuss further.  At that time, my plan was: I have recommended Aricept 5 mg a day and recheck in one month. I suspect that he has early signs of Alzheimer's dementia. Patient's family would like to follow-up with neurology for a second opinion which I will happily arrange. I would recommend increasing the dose of Aricept as tolerated and memory loss worsens, adding Namenda to prevent further deterioration as long as possible. Spent more than 20 minutes with the family today discussing this. I will gladly consult neurology for a second opinion per their request  11/20/17 Patient is currently on Aricept 5 mg a day. Since starting the medication, he has had more vivid dreams. However he denies any insomnia. He denies any hallucination. He denies any nausea or vomiting or diarrhea. He denies any trouble sleeping. He seems to be tolerating the medication well. Obviously it is too soon to detect any change in behavior that he is comfortable taking the medication. They have an appointment scheduled to see a neurologist next week. They're here today to discuss uptitrating Aricept   Past Medical History:  Diagnosis Date  . Hypertension   . Myocardial infarction [facility])    Dr. Terrence Dupont   No past surgical history on file. Current Outpatient Medications on File Prior to Visit  Medication Sig Dispense Refill  . aspirin EC 81 MG tablet Take 81 mg by mouth daily.    Marland Kitchen atorvastatin (LIPITOR) 10 MG tablet   1  . donepezil (ARICEPT) 5 MG tablet Take 1 tablet (5 mg total) by mouth at bedtime. 30 tablet 5  . losartan (COZAAR) 25 MG tablet  Take 25 mg by mouth daily.    . metoprolol succinate (TOPROL-XL) 25 MG 24 hr tablet   1   No current facility-administered medications on file prior to visit.    No Known Allergies Social History   Socioeconomic History  . Marital status: Married    Spouse name: Not on file  . Number of children: Not on file  . Years of education: Not on file  . Highest education level: Not on file  Social Needs  . Financial resource strain: Not on file  . Food insecurity - worry: Not on file  . Food insecurity - inability: Not on file  . Transportation needs - medical: Not on file  . Transportation needs - non-medical: Not on file  Occupational History  . Not on file  Tobacco Use  . Smoking status: Former Research scientist (life sciences)  . Smokeless tobacco: Former Systems developer  . Tobacco comment: quit 1990  Substance and Sexual Activity  . Alcohol use: Yes    Alcohol/week: 14.4 oz    Types: 24 Cans of beer per week  . Drug use: No  . Sexual activity: Yes  Other Topics Concern  . Not on file  Social History Narrative  . Not on file   No family history on file.     Review of Systems  All other systems reviewed and are negative.      Objective:   Physical Exam  Constitutional: He is oriented to person, place, and time. He appears well-developed and well-nourished. No distress.  HENT:  Head: Normocephalic and atraumatic.  Right Ear: External ear normal.  Left Ear: External ear normal.  Nose: Nose normal.  Mouth/Throat: Oropharynx is clear and moist. No oropharyngeal exudate.  Eyes: Conjunctivae and EOM are normal. Pupils are equal, round, and reactive to light. Right eye exhibits no discharge. Left eye exhibits no discharge. No scleral icterus.  Neck: Normal range of motion. Neck supple. No JVD present. No thyromegaly present.  Cardiovascular: Normal rate, regular rhythm, normal heart sounds and intact distal pulses. Exam reveals no gallop and no friction rub.  No murmur heard. Pulmonary/Chest: Effort normal  and breath sounds normal. No respiratory distress. He has no wheezes. He has no rales. He exhibits no tenderness.  Abdominal: Soft. Bowel sounds are normal. He exhibits no distension and no mass. There is no tenderness. There is no rebound and no guarding.  Lymphadenopathy:    He has no cervical adenopathy.  Neurological: He is alert and oriented to person, place, and time. He has normal reflexes. No cranial nerve deficit.  Skin: He is not diaphoretic.  Psychiatric: He has a normal mood and affect. His behavior is normal. Judgment and thought content normal.  Vitals reviewed.         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 MRI shows no evidence of previous strokes to support vascular dementia. Physical exam does not support Parkinson's disease. Therefore I believe the patient has early signs of Alzheimer's disease. Honestly, it is difficult to see any abnormalities just simply talking to the patient. However both the patient and the wife states that they can tell a change over the last few years particularly this last year. Biggest concerns continue to be word finding difficulties and short term memory problems.  Will add Aricept 10 mg a day and reassess in 6 months.  I will not add Namenda as long as the patient is stable. If the family and patient noticed deterioration, consider adding Namenda at that point

## 2017-11-24 ENCOUNTER — Encounter: Payer: Self-pay | Admitting: Neurology

## 2017-11-24 ENCOUNTER — Ambulatory Visit (INDEPENDENT_AMBULATORY_CARE_PROVIDER_SITE_OTHER): Payer: Medicare Other | Admitting: Neurology

## 2017-11-24 DIAGNOSIS — R413 Other amnesia: Secondary | ICD-10-CM | POA: Diagnosis not present

## 2017-11-24 HISTORY — DX: Other amnesia: R41.3

## 2017-11-24 NOTE — Progress Notes (Signed)
Reason for visit: Memory disturbance  Referring physician: Dr. Doreene Nest is a 67 y.o. male  History of present illness:  Mr. Macchia is a 67 year old right-handed white male with a history of onset of memory disturbance about 6 months ago that has gradually progressed since that time.  He has noted some problems with word finding, difficulty with spelling, he has had some generalized short-term memory issues with difficulty remembering recent events.  He also indicates that he has had right carpal tunnel syndrome surgery in the past, he has had recurring numbness in the right hand and forearm.  He has some achy sensation in the hand and forearm as well.  He was seen by Dr. Fredna Dow, and he has been referred to a surgeon for a possibility of a cervical radiculopathy.  He has had EMG and nerve conduction study done recently.  The patient denies any neck pain, he denies issues controlling the bowels or the bladder.  He has not had any balance issues or falls.  The patient will occasionally have some twitches or jerks involving the right upper extremity.  He has undergone MRI of the brain that does show some mild cortical atrophy, no evidence of significant cerebrovascular disease has been noted.  The patient has been placed on Aricept, he is tolerating the medication well.  He has had blood work that included a thyroid profile and a vitamin B12 level.  He is sent to this office for further evaluation.  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  He is able to keep up with medications and appointments, he does not do the finances.  Past Medical History:  Diagnosis Date  . Hypertension   . Myocardial infarction [facility])    Dr. Terrence Dupont    No past surgical history on file.  No family history on file.  Social history:  reports that he has quit smoking. He has quit using smokeless tobacco. He reports that he drinks about 14.4 oz of alcohol  per week. He reports that he does not use drugs.  Medications:  Prior to Admission medications   Medication Sig Start Date End Date Taking? Authorizing Provider  aspirin EC 81 MG tablet Take 81 mg by mouth daily.   Yes [provider]  atorvastatin (LIPITOR) 10 MG tablet  09/01/14  Yes [provider]  donepezil (ARICEPT) 10 MG tablet Take 1 tablet (10 mg total) by mouth at bedtime. 11/20/17  Yes Susy Frizzle, MD  losartan (COZAAR) 50 MG tablet Take 50 mg by mouth daily.   Yes [provider]      Allergies  Allergen Reactions  . Penicillin G Anaphylaxis    ROS:  Out of a complete 14 system review of symptoms, the patient complains only of the following symptoms, and all other reviewed systems are negative.  Ringing in the ears Easy bruising, easy bleeding Joint pain, muscle cramps, aching muscles Allergies Memory loss, confusion, slurred speech Not enough sleep Sleepiness  Blood pressure (!) 142/85, pulse (!) 59, height 5\' 7"  (1.702 m), weight 175 lb 8 oz (79.6 kg).  Physical Exam  General: The patient is alert and cooperative at the time of the examination.  Eyes: Pupils are equal, round, and reactive to light. Discs are flat bilaterally.  Neck: The neck is supple, no carotid bruits are noted.  Respiratory: The respiratory examination is clear.  Cardiovascular: The cardiovascular examination reveals a regular rate  and rhythm, no obvious murmurs or rubs are noted.  Skin: Extremities are without significant edema.  Neurologic Exam  Mental status: The patient is alert and oriented x 3 at the time of the examination. The Mini-Mental status examination done today shows a total score of 18/30..  Cranial nerves: Facial symmetry is present. There is good sensation of the face to pinprick and soft touch bilaterally. The strength of the facial muscles and the muscles to head turning and shoulder shrug are normal bilaterally. Speech is well  enunciated, no aphasia or dysarthria is noted. Extraocular movements are full. Visual fields are full. The tongue is midline, and the patient has symmetric elevation of the soft palate. No obvious hearing deficits are noted.  Motor: The motor testing reveals 5 over 5 strength of all 4 extremities. Good symmetric motor tone is noted throughout.  Sensory: Sensory testing is intact to pinprick, soft touch, vibration sensation, and position sense on all 4 extremities. No evidence of extinction is noted.  Coordination: Cerebellar testing reveals good finger-nose-finger and heel-to-shin bilaterally.  Occasional twitchiness of the right hand and arm is noted.  Gait and station: Gait is normal.  With walking however, the patient appears to hold the right arm in flexion slightly, decreased arm swing is seen on the right.  Tandem gait is normal. Romberg is negative. No drift is seen.  Reflexes: Deep tendon reflexes are symmetric and normal bilaterally. Toes are downgoing bilaterally.   Assessment/Plan:  1.  Progressive memory disturbance  2.  Right arm numbness and discomfort  The clinical examination does reveal some mild subtle changes in function of the right upper extremity.  The patient does hold the right hand and arm in an unusual position with walking, decreased arm swing is seen.  The possibility of a developing syndrome such as Lewy body dementia or corticobasilar degeneration needs to be considered.  The patient is on Aricept at this time, we will follow-up in about 6 months, we will look for any evolution of new motor findings.   Jill Alexanders MD 11/24/2017 12:03 PM  [facility] [address] [place], [place] [address]  Phone [phone] Fax [phone]

## 2017-12-16 DIAGNOSIS — M5412 Radiculopathy, cervical region: Secondary | ICD-10-CM | POA: Diagnosis not present

## 2017-12-19 ENCOUNTER — Other Ambulatory Visit: Payer: Self-pay | Admitting: Family Medicine

## 2017-12-19 DIAGNOSIS — M5412 Radiculopathy, cervical region: Secondary | ICD-10-CM

## 2017-12-25 ENCOUNTER — Ambulatory Visit
Admission: RE | Admit: 2017-12-25 | Discharge: 2017-12-25 | Disposition: A | Discharge status: Home / Self Care | Payer: Medicare Other | Source: Ambulatory Visit | Attending: Family Medicine | Admitting: Family Medicine

## 2017-12-25 DIAGNOSIS — M4802 Spinal stenosis, cervical region: Secondary | ICD-10-CM | POA: Diagnosis not present

## 2017-12-25 DIAGNOSIS — M5412 Radiculopathy, cervical region: Secondary | ICD-10-CM

## 2017-12-30 DIAGNOSIS — M4802 Spinal stenosis, cervical region: Secondary | ICD-10-CM | POA: Diagnosis not present

## 2018-01-01 DIAGNOSIS — M65331 Trigger finger, right middle finger: Secondary | ICD-10-CM | POA: Diagnosis not present

## 2018-01-01 DIAGNOSIS — M65341 Trigger finger, right ring finger: Secondary | ICD-10-CM | POA: Diagnosis not present

## 2018-01-01 DIAGNOSIS — G5603 Carpal tunnel syndrome, bilateral upper limbs: Secondary | ICD-10-CM | POA: Diagnosis not present

## 2018-01-01 DIAGNOSIS — M65351 Trigger finger, right little finger: Secondary | ICD-10-CM | POA: Diagnosis not present

## 2018-01-06 ENCOUNTER — Ambulatory Visit: Payer: Medicare Other | Attending: Family Medicine | Admitting: Physical Therapy

## 2018-01-06 ENCOUNTER — Encounter: Payer: Self-pay | Admitting: Physical Therapy

## 2018-01-06 ENCOUNTER — Other Ambulatory Visit: Payer: Self-pay

## 2018-01-06 DIAGNOSIS — R293 Abnormal posture: Secondary | ICD-10-CM | POA: Insufficient documentation

## 2018-01-06 DIAGNOSIS — M79601 Pain in right arm: Secondary | ICD-10-CM | POA: Diagnosis not present

## 2018-01-06 DIAGNOSIS — M542 Cervicalgia: Secondary | ICD-10-CM | POA: Diagnosis not present

## 2018-01-07 ENCOUNTER — Encounter: Payer: Self-pay | Admitting: Physical Therapy

## 2018-01-07 NOTE — Therapy (Signed)
[place], [place], [address] Phone: [phone]   Fax:  [phone]  Physical Therapy Evaluation  Patient Details  Name: Jay Rivas MRN: [id number] Date of Birth: 11/07/1950 Referring Provider: Stanton Kidney NP    Encounter Date: 01/06/2018  PT End of Session - 01/06/18 1619    Visit Number  1    Number of Visits  12    Date for PT Re-Evaluation  02/17/18    Authorization Type  Medicare triad     PT Start Time  0300    PT Stop Time  0346    PT Time Calculation (min)  46 min    Activity Tolerance  Patient tolerated treatment well    Behavior During Therapy  [facility] for tasks assessed/performed       Past Medical History:  Diagnosis Date  . Hypertension   . Memory disorder 11/24/2017  . Myocardial infarction [facility])    Dr. Terrence Dupont    History reviewed. No pertinent surgical history.  There were no vitals filed for this visit.   Subjective Assessment - 01/06/18 1504    Subjective  About 3 months ago the patient began having mid arm pain n the right. The pain increases with activity. He had carpel tunnel pain prior to the onset of the arm pain. 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 He aso feels like her is having some jerking of the arm.     Limitations  Standing;Walking    Diagnostic tests  MRI: Severeforaminal stenosis     Currently in Pain?  Yes    Pain Score  4     Pain Location  Arm    Pain Orientation  Mid    Pain Descriptors / Indicators  Aching;Numbness    Pain Type  Chronic pain    Pain Radiating Towards  into the middle of the arm     Pain Onset  More than a month ago    Pain Frequency  Constant         [facility] PT Assessment - 01/07/18 0001      Assessment   Medical Diagnosis  Right arm pain     Referring Provider  Stanton Kidney NP     Onset Date/Surgical Date  -- > 3 months prior     Hand Dominance  Right    Next MD Visit  Nothing Scheduled     Prior  Therapy  None       Precautions   Precautions  None      Restrictions   Weight Bearing Restrictions  No      Balance Screen   Has the patient fallen in the past 6 months  No    Has the patient had a decrease in activity level because of a fear of falling?   No    Is the patient reluctant to leave their home because of a fear of falling?   No      Home Film/video editor residence      Prior Function   Level of Independence  Independent    Vocation  Part time employment    Vocation Requirements  Buys and sells antiques    Leisure  Has palyed golf but isnt any more       Cognition   Overall Cognitive Status  Within Functional Limits for tasks assessed    Attention  Focused    Focused Attention  Appears  intact    Memory  Appears intact    Awareness  Appears intact    Problem Solving  Appears intact      Observation/Other Assessments   Observations  triggrer finger of ring finger       Sensation   Light Touch  Appears Intact    Additional Comments  numbness and pain into his bicpes area and at times into his hand       AROM   Cervical Flexion  28    Cervical Extension  23    Cervical - Right Rotation  40    Cervical - Left Rotation  45      Strength   Overall Strength Comments  gross shoulder strength 5/5     Right/Left hand  Right;Left    Right Hand Grip (lbs)  55    Left Hand Grip (lbs)  60      Special Tests   Other special tests  spurlings test (-) bilateral              Objective measurements completed on examination: See above findings.      [facility] Adult PT Treatment/Exercise - 01/07/18 0001      Neck Exercises: Standing   Other Standing Exercises  Standing scapular retractions red 2x10       Neck Exercises: Stretches   Upper Trapezius Stretch  3 reps;20 seconds    Levator Stretch  3 reps;20 seconds             PT Education - 01/06/18 1618    Education provided  Yes    Education Details  HEP, symptom mangment,  anatomy of the condition     Person(s) Educated  Patient    Methods  Explanation;Tactile cues;Verbal cues;Demonstration;Handout    Comprehension  Verbalized understanding;Verbal cues required;Returned demonstration;Tactile cues required       PT Short Term Goals - 01/06/18 1624      PT SHORT TERM GOAL #1   Title  Patient will increase bilateral cervical rotation to 60 degrees     Time  4    Period  Weeks    Status  New    Target Date  02/03/18      PT SHORT TERM GOAL #2   Title  Patient will report pain < 2/10 with no radicualr pain into his right arm     Time  4    Period  Weeks    Status  New    Target Date  02/03/18      PT SHORT TERM GOAL #3   Title  Patient will be indepndnet with inital HEP for postur and stretching     Time  4    Period  Weeks    Status  New    Target Date  02/03/18        PT Long Term Goals - 01/07/18 1122      PT LONG TERM GOAL #1   Title  Patient will report no radicular pain or numbness into his right arm in order to perform ADL's     Time  8    Period  Weeks    Status  New    Target Date  03/04/18      PT LONG TERM GOAL #2   Title  Patient will lift 25 lbs without  reported increased radicular pain in order to lift furniture     Time  8    Period  Weeks    Status  New    Target Date  03/04/18      PT LONG TERM GOAL #3   Title  Patient will demostrate a 23% limitation on FOTO     Time  8    Period  Weeks    Status  New    Target Date  03/04/18      PT LONG TERM GOAL #4   Title  Patient will increase bilateral cervical rotation to 70 degrees in order to increase safety driving.     Time  8    Period  Weeks    Status  New    Target Date  03/04/18             Plan - 01/06/18 1621    Clinical Impression Statement  Patient is a 67 year old male with numbness and pain into his right arm. 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 He has limited cervical rotation and extension. He reported  improved symptoms with cervical distraction. He also reports carpal tunnel syndrome at baseline. He deals in antique furniture. He has increased ymptoms when he lifts furniture. he would benefit from skilled therapy to reduce numbnress and pain.      Clinical Presentation  Evolving    Clinical Decision Making  Low    PT Frequency  2x / week    PT Duration  6 weeks    PT Treatment/Interventions  ADLs/Self Care Home Management;Cryotherapy;Electrical Stimulation;Moist Heat;Traction;Therapeutic activities;Therapeutic exercise;Neuromuscular re-education;Patient/family education;Manual techniques;Taping;Dry needling    Consulted and Agree with Plan of Care  Patient       Patient will benefit from skilled therapeutic intervention in order to improve the following deficits and impairments:  Pain, Postural dysfunction, Increased muscle spasms, Decreased activity tolerance, Decreased endurance, Decreased range of motion, Decreased strength  Visit Diagnosis: Pain in right arm  Cervicalgia  Abnormal posture     Problem List Patient Active Problem List   Diagnosis Date Noted  . Memory disorder 11/24/2017  . Hypertension 09/06/2014    Carney Living PT DPT  01/07/2018, 11:28 AM  [facility] [address] [place], [place], [address] Phone: [phone]   Fax:  [phone]  Name: EWAN GRAU MRN: [id number] Date of Birth: 28-May-1951

## 2018-01-12 ENCOUNTER — Ambulatory Visit: Payer: Medicare Other | Admitting: Physical Therapy

## 2018-01-12 DIAGNOSIS — R293 Abnormal posture: Secondary | ICD-10-CM

## 2018-01-12 DIAGNOSIS — M79601 Pain in right arm: Secondary | ICD-10-CM

## 2018-01-12 DIAGNOSIS — M542 Cervicalgia: Secondary | ICD-10-CM

## 2018-01-12 NOTE — Therapy (Signed)
[place], [place], [address] Phone: [phone]   Fax:  [phone]  Physical Therapy Treatment  Patient Details  Name: Jay Rivas MRN: [id number] Date of Birth: 1951-09-05 Referring Provider: Stanton Kidney NP    Encounter Date: 01/12/2018  PT End of Session - 01/12/18 0911    Visit Number  2    Number of Visits  12    Date for PT Re-Evaluation  02/17/18    Authorization Type  Medicare triad     PT Start Time  0850    PT Stop Time  0930    PT Time Calculation (min)  40 min    Activity Tolerance  Patient tolerated treatment well    Behavior During Therapy  [facility] for tasks assessed/performed       Past Medical History:  Diagnosis Date  . Hypertension   . Memory disorder 11/24/2017  . Myocardial infarction [facility])    Dr. Terrence Dupont    No past surgical history on file.  There were no vitals filed for this visit.  Subjective Assessment - 01/12/18 0853    Subjective  Pt reports that numbness in his arm has remained the same. Pt reports no pain coming in today. He has been working a lot in his yard. He has been doing his exercises.     Limitations  Standing;Walking    Diagnostic tests  MRI: Severeforaminal stenosis     Currently in Pain?  No/denies                      [facility] Adult PT Treatment/Exercise - 01/12/18 0001      Neck Exercises: Machines for Strengthening   UBE (Upper Arm Bike)  2 mins retro      Neck Exercises: Standing   Other Standing Exercises  Standing scapular retractions green 2x10     Other Standing Exercises  Standing shoulder extension; green; 2x10      Neck Exercises: Supine   Other Supine Exercise  Bilateral ER; red; 2x10 bilateral horizontal abduction;red; 2x10      Manual Therapy   Manual Therapy  Soft tissue mobilization;Manual Traction    Soft tissue mobilization  upper traps; cervical paraspinals    Manual Traction  cervical spine      Neck  Exercises: Stretches   Upper Trapezius Stretch  3 reps;20 seconds    Levator Stretch  3 reps;20 seconds             PT Education - 01/12/18 0910    Education provided  Yes    Education Details  review exercise technique, update HEP     Person(s) Educated  Patient    Methods  Explanation;Demonstration;Tactile cues;Verbal cues    Comprehension  Verbalized understanding;Returned demonstration;Need further instruction       PT Short Term Goals - 01/12/18 6269      PT SHORT TERM GOAL #1   Title  Patient will increase bilateral cervical rotation to 60 degrees     Time  4    Period  Weeks    Status  On-going      PT SHORT TERM GOAL #2   Title  Patient will report pain < 2/10 with no radicualr pain into his right arm     Baseline  no change in the pain     Time  4    Period  Weeks    Status  On-going      PT SHORT  TERM GOAL #3   Title  Patient will be indepndnet with inital HEP for postur and stretching     Time  4    Period  Weeks    Status  New        PT Long Term Goals - 01/07/18 1122      PT LONG TERM GOAL #1   Title  Patient will report no radicular pain or numbness into his right arm in order to perform ADL's     Time  8    Period  Weeks    Status  New    Target Date  03/04/18      PT LONG TERM GOAL #2   Title  Patient will lift 25 lbs without  reported increased radicular pain in order to lift furniture     Time  8    Period  Weeks    Status  New    Target Date  03/04/18      PT LONG TERM GOAL #3   Title  Patient will demostrate a 23% limitation on FOTO     Time  8    Period  Weeks    Status  New    Target Date  03/04/18      PT LONG TERM GOAL #4   Title  Patient will increase bilateral cervical rotation to 70 degrees in order to increase safety driving.     Time  8    Period  Weeks    Status  New    Target Date  03/04/18            Plan - 01/12/18 0919    Clinical Impression Statement  Therapy added supine scapular strengthening. Pt  had no change with manual cervical traction. No tenderness to palpation of the lateral epicodyle and forearm. Pt continues to experience numbness in his mid arm.     Clinical Presentation  Evolving    Clinical Decision Making  Low    Rehab Potential  Good    PT Frequency  2x / week    PT Duration  6 weeks    PT Treatment/Interventions  ADLs/Self Care Home Management;Cryotherapy;Electrical Stimulation;Moist Heat;Traction;Therapeutic activities;Therapeutic exercise;Neuromuscular re-education;Patient/family education;Manual techniques;Taping;Dry needling    PT Next Visit Plan  continue wth manual therapy and strengthening; consider looking at TOS if symptoms dont change    PT Home Exercise Plan  scap retraction; shoulder extension; supine ER; supine horizontal abduction.     Consulted and Agree with Plan of Care  Patient       Patient will benefit from skilled therapeutic intervention in order to improve the following deficits and impairments:  Pain, Postural dysfunction, Increased muscle spasms, Decreased activity tolerance, Decreased endurance, Decreased range of motion, Decreased strength  Visit Diagnosis: Pain in right arm  Cervicalgia  Abnormal posture     Problem List Patient Active Problem List   Diagnosis Date Noted  . Memory disorder 11/24/2017  . Hypertension 09/06/2014    Carney Living 01/12/2018, 1:19 PM  [facility] [address] [place], [place], [address] Phone: [phone]   Fax:  [phone]  Name: Jay Rivas MRN: [id number] Date of Birth: Mar 31, 1951

## 2018-01-21 ENCOUNTER — Encounter: Payer: Self-pay | Admitting: Physical Therapy

## 2018-01-21 ENCOUNTER — Ambulatory Visit: Payer: Medicare Other | Admitting: Physical Therapy

## 2018-01-21 DIAGNOSIS — M542 Cervicalgia: Secondary | ICD-10-CM

## 2018-01-21 DIAGNOSIS — M79601 Pain in right arm: Secondary | ICD-10-CM | POA: Diagnosis not present

## 2018-01-21 DIAGNOSIS — R293 Abnormal posture: Secondary | ICD-10-CM | POA: Diagnosis not present

## 2018-01-21 NOTE — Therapy (Signed)
[place], [place], [address] Phone: [phone]   Fax:  [phone]  Physical Therapy Treatment  Patient Details  Name: Jay Rivas MRN: [id number] Date of Birth: 11-29-1950 Referring Provider: Stanton Kidney NP    Encounter Date: 01/21/2018  PT End of Session - 01/21/18 1722    Visit Number  3    Number of Visits  12    Date for PT Re-Evaluation  02/17/18    Authorization Type  Medicare triad     PT Start Time  2353    PT Stop Time  1058    PT Time Calculation (min)  43 min    Activity Tolerance  Patient tolerated treatment well    Behavior During Therapy  [facility] for tasks assessed/performed       Past Medical History:  Diagnosis Date  . Hypertension   . Memory disorder 11/24/2017  . Myocardial infarction [facility])    Dr. Terrence Dupont    History reviewed. No pertinent surgical history.  There were no vitals filed for this visit.  Subjective Assessment - 01/21/18 1017    Subjective  Pt reports that his pain has not been that bad. Pt reports that he is still having some numbness in his arm but not as much. Pt felt good after last therapy visit.     Limitations  Standing;Walking    Diagnostic tests  MRI: Severeforaminal stenosis     Currently in Pain?  Yes    Pain Score  1     Pain Location  Arm    Pain Descriptors / Indicators  Aching;Numbness    Pain Type  Chronic pain    Pain Onset  More than a month ago    Pain Frequency  Constant                No data recorded       [facility] Adult PT Treatment/Exercise - 01/21/18 0001      Neck Exercises: Machines for Strengthening   UBE (Upper Arm Bike)  3 mins retro              PT Education - 01/21/18 1721    Education provided  Yes    Education Details  reivewed ther-ex     [employer]) Educated  Patient    Methods  Explanation;Verbal cues;Demonstration;Tactile cues    Comprehension  Verbalized understanding;Returned  demonstration;Verbal cues required;Tactile cues required       PT Short Term Goals - 01/12/18 0923      PT SHORT TERM GOAL #1   Title  Patient will increase bilateral cervical rotation to 60 degrees     Time  4    Period  Weeks    Status  On-going      PT SHORT TERM GOAL #2   Title  Patient will report pain < 2/10 with no radicualr pain into his right arm     Baseline  no change in the pain     Time  4    Period  Weeks    Status  On-going      PT SHORT TERM GOAL #3   Title  Patient will be indepndnet with inital HEP for postur and stretching     Time  4    Period  Weeks    Status  New        PT Long Term Goals - 01/07/18 1122      PT LONG TERM GOAL #1  Title  Patient will report no radicular pain or numbness into his right arm in order to perform ADL's     Time  8    Period  Weeks    Status  New    Target Date  03/04/18      PT LONG TERM GOAL #2   Title  Patient will lift 25 lbs without  reported increased radicular pain in order to lift furniture     Time  8    Period  Weeks    Status  New    Target Date  03/04/18      PT LONG TERM GOAL #3   Title  Patient will demostrate a 23% limitation on FOTO     Time  8    Period  Weeks    Status  New    Target Date  03/04/18      PT LONG TERM GOAL #4   Title  Patient will increase bilateral cervical rotation to 70 degrees in order to increase safety driving.     Time  8    Period  Weeks    Status  New    Target Date  03/04/18            Plan - 01/21/18 1722    Clinical Impression Statement  Patient is making progress. His numbness is intemittent even with work. He tolerated exercises well. he continues to have minor spasming of the upper trap. He was encouraged to continue his exercises at home.     Clinical Presentation  Evolving    Clinical Decision Making  Low    Rehab Potential  Good    PT Frequency  2x / week    PT Duration  6 weeks    PT Treatment/Interventions  ADLs/Self Care Home  Management;Cryotherapy;Electrical Stimulation;Moist Heat;Traction;Therapeutic activities;Therapeutic exercise;Neuromuscular re-education;Patient/family education;Manual techniques;Taping;Dry needling    PT Next Visit Plan  continue wth manual therapy and strengthening; consider lloking at TOS if symptoms dont change    PT Home Exercise Plan  scap retraction; shoulder extension; supine ER; supine horizontal abduction.     Consulted and Agree with Plan of Care  Patient       Patient will benefit from skilled therapeutic intervention in order to improve the following deficits and impairments:  Pain, Postural dysfunction, Increased muscle spasms, Decreased activity tolerance, Decreased endurance, Decreased range of motion, Decreased strength  Visit Diagnosis: Pain in right arm  Cervicalgia  Abnormal posture     Problem List Patient Active Problem List   Diagnosis Date Noted  . Memory disorder 11/24/2017  . Hypertension 09/06/2014    Carney Living 01/21/2018, 5:32 PM  [facility] [address] [place], [place], [address] Phone: [phone]   Fax:  [phone]  Name: CLINT BIELLO MRN: [id number] Date of Birth: 1950/11/19

## 2018-01-26 ENCOUNTER — Ambulatory Visit: Payer: Medicare Other | Attending: Family Medicine | Admitting: Physical Therapy

## 2018-01-26 ENCOUNTER — Encounter: Payer: Self-pay | Admitting: Physical Therapy

## 2018-01-26 DIAGNOSIS — M79601 Pain in right arm: Secondary | ICD-10-CM

## 2018-01-26 DIAGNOSIS — R293 Abnormal posture: Secondary | ICD-10-CM | POA: Insufficient documentation

## 2018-01-26 DIAGNOSIS — M542 Cervicalgia: Secondary | ICD-10-CM | POA: Diagnosis not present

## 2018-01-26 NOTE — Therapy (Signed)
[place], [place], [address] Phone: [phone]   Fax:  [phone]  Physical Therapy Treatment  Patient Details  Name: Jay Rivas MRN: [id number] Date of Birth: [birth year] Referring Provider: Stanton Kidney NP    Encounter Date: 01/26/2018  PT End of Session - 01/26/18 0818    Visit Number  4    Number of Visits  12    Date for PT Re-Evaluation  02/17/18    Authorization Type  Medicare triad     PT Start Time  0800    PT Stop Time  0841    PT Time Calculation (min)  41 min    Activity Tolerance  Patient tolerated treatment well    Behavior During Therapy  [facility] for tasks assessed/performed       Past Medical History:  Diagnosis Date  . Hypertension   . Memory disorder 11/24/2017  . Myocardial infarction [facility])    Dr. Terrence Dupont    History reviewed. No pertinent surgical history.  There were no vitals filed for this visit.  Subjective Assessment - 01/26/18 0806    Subjective  Patient report that his arm is doing better and that he is having no pain today. Pt was active this weekend and did not have an increase in symptoms.    Limitations  Standing;Walking    Diagnostic tests  MRI: Severeforaminal stenosis     Currently in Pain?  No/denies                       [facility] Adult PT Treatment/Exercise - 01/26/18 0001      Neck Exercises: Machines for Strengthening   UBE (Upper Arm Bike)  3 mins retro       Neck Exercises: Standing   Other Standing Exercises  Standing scapular retractions green 2x10     Other Standing Exercises  Standing shoulder extension; green; 2x10; Standing Y 2x0 1lb flexion 2x10 1lb       Neck Exercises: Supine   Other Supine Exercise  Bilateral ER; red; 2x10 bilateral horizontal abduction;red; 2x10; D2 flexion 2x10;       Manual Therapy   Manual Therapy  Soft tissue mobilization;Manual Traction    Soft tissue mobilization  upper traps; cervical  paraspinals    Manual Traction  cervical spine             PT Education - 01/26/18 0818    Education provided  Yes    Education Details  reviewed technique with ther-ex     Person(s) Educated  Patient    Methods  Explanation;Demonstration;Tactile cues;Verbal cues;Handout    Comprehension  Verbalized understanding;Returned demonstration;Verbal cues required;Tactile cues required;Need further instruction       PT Short Term Goals - 01/26/18 7824      PT SHORT TERM GOAL #1   Title  Patient will increase bilateral cervical rotation to 60 degrees     Time  4    Period  Weeks    Status  On-going      PT SHORT TERM GOAL #2   Title  Patient will report pain < 2/10 with no radicualr pain into his right arm     Baseline  no pain assess carryover     Time  4    Period  Weeks    Status  On-going      PT SHORT TERM GOAL #3   Title  Patient will be indepndnet with inital HEP for  postur and stretching     Time  4    Period  Weeks    Status  On-going        PT Long Term Goals - 01/07/18 1122      PT LONG TERM GOAL #1   Title  Patient will report no radicular pain or numbness into his right arm in order to perform ADL's     Time  8    Period  Weeks    Status  New    Target Date  03/04/18      PT LONG TERM GOAL #2   Title  Patient will lift 25 lbs without  reported increased radicular pain in order to lift furniture     Time  8    Period  Weeks    Status  New    Target Date  03/04/18      PT LONG TERM GOAL #3   Title  Patient will demostrate a 23% limitation on FOTO     Time  8    Period  Weeks    Status  New    Target Date  03/04/18      PT LONG TERM GOAL #4   Title  Patient will increase bilateral cervical rotation to 70 degrees in order to increase safety driving.     Time  8    Period  Weeks    Status  New    Target Date  03/04/18            Plan - 01/26/18 0820    Clinical Impression Statement  Patient continues to be able to perfrom exercises  without increased symptoms. He has been doing his normal work without pain in his neck or shoulder. Therapy will continue to progress activity as tolerated.     Clinical Presentation  Evolving    Clinical Decision Making  Low    Rehab Potential  Good    PT Frequency  2x / week    PT Duration  6 weeks    PT Treatment/Interventions  ADLs/Self Care Home Management;Cryotherapy;Electrical Stimulation;Moist Heat;Traction;Therapeutic activities;Therapeutic exercise;Neuromuscular re-education;Patient/family education;Manual techniques;Taping;Dry needling    PT Next Visit Plan  continue wth manual therapy and strengthening; consider lloking at TOS if symptoms dont change    PT Home Exercise Plan  scap retraction; shoulder extension; supine ER; supine horizontal abduction.     Consulted and Agree with Plan of Care  Patient       Patient will benefit from skilled therapeutic intervention in order to improve the following deficits and impairments:  Pain, Postural dysfunction, Increased muscle spasms, Decreased activity tolerance, Decreased endurance, Decreased range of motion, Decreased strength, Decreased coordination  Visit Diagnosis: Pain in right arm  Cervicalgia  Abnormal posture     Problem List Patient Active Problem List   Diagnosis Date Noted  . Memory disorder 11/24/2017  . Hypertension 09/06/2014    Carney Living  PT DPT  01/26/2018, 11:54 AM   Cooper Render SPT  01/26/2018   During this treatment session, the therapist was present, participating in and directing the treatment.  [place], [place], [address] Phone: [phone]   Fax:  [phone]  Name: BRANDUN PINN MRN: [id number] Date of Birth: [birth year]

## 2018-01-28 ENCOUNTER — Encounter: Payer: Self-pay | Admitting: Physical Therapy

## 2018-01-28 ENCOUNTER — Ambulatory Visit: Payer: Medicare Other | Admitting: Physical Therapy

## 2018-01-28 DIAGNOSIS — M542 Cervicalgia: Secondary | ICD-10-CM | POA: Diagnosis not present

## 2018-01-28 DIAGNOSIS — M79601 Pain in right arm: Secondary | ICD-10-CM

## 2018-01-28 DIAGNOSIS — R293 Abnormal posture: Secondary | ICD-10-CM | POA: Diagnosis not present

## 2018-01-28 NOTE — Therapy (Addendum)
[place], [place], [address] Phone: [phone]   Fax:  [phone]  Physical Therapy Treatment  Patient Details  Name: Jay Rivas MRN: [id number] Date of Birth: 04/24/1951 Referring Provider: Stanton Kidney NP    Encounter Date: 01/28/2018  PT End of Session - 01/28/18 0801    Visit Number  5    Number of Visits  12    Date for PT Re-Evaluation  02/17/18    Authorization Type  Medicare triad     PT Start Time  0758    PT Stop Time  0845    PT Time Calculation (min)  47 min    Activity Tolerance  Patient tolerated treatment well    Behavior During Therapy  [facility] for tasks assessed/performed       Past Medical History:  Diagnosis Date  . Hypertension   . Memory disorder 11/24/2017  . Myocardial infarction [facility])    Dr. Terrence Dupont    History reviewed. No pertinent surgical history.  There were no vitals filed for this visit.  Subjective Assessment - 01/28/18 0759    Subjective  Patient reports no pain in his right arm today. Patient states that he experienced no soreness after last therapy visit.     Limitations  Standing;Walking    Diagnostic tests  MRI: Severeforaminal stenosis     Currently in Pain?  No/denies    Pain Score  0-No pain                       [facility] Adult PT Treatment/Exercise - 01/28/18 0001      Neck Exercises: Machines for Strengthening   UBE (Upper Arm Bike)  4 mins retro       Neck Exercises: Standing   Other Standing Exercises  Standing scapular retractions blue 2x10     Other Standing Exercises  Standing shoulder extension; blue; 2x10; Standing Y 2x0 2lb flexion 2x10 2lb       Neck Exercises: Supine   Other Supine Exercise  Bilateral ER; green; 3x10 bilateral horizontal abduction;green; 3x10; D2 flexion 3x10; Flexion with horizontal pull 3x10      Manual Therapy   Manual Therapy  Soft tissue mobilization;Manual Traction    Soft tissue mobilization   upper traps; cervical paraspinals    Manual Traction  cervical spine             PT Education - 01/28/18 0800    Education provided  Yes    Education Details  exercise technique    Person(s) Educated  Patient    Methods  Explanation;Demonstration;Tactile cues;Verbal cues    Comprehension  Verbalized understanding;Returned demonstration;Need further instruction       PT Short Term Goals - 01/26/18 5277      PT SHORT TERM GOAL #1   Title  Patient will increase bilateral cervical rotation to 60 degrees     Time  4    Period  Weeks    Status  On-going      PT SHORT TERM GOAL #2   Title  Patient will report pain < 2/10 with no radicualr pain into his right arm     Baseline  no pain assess carryover     Time  4    Period  Weeks    Status  On-going      PT SHORT TERM GOAL #3   Title  Patient will be indepndnet with inital HEP for postur and stretching  Time  4    Period  Weeks    Status  On-going        PT Long Term Goals - 01/07/18 1122      PT LONG TERM GOAL #1   Title  Patient will report no radicular pain or numbness into his right arm in order to perform ADL's     Time  8    Period  Weeks    Status  New    Target Date  03/04/18      PT LONG TERM GOAL #2   Title  Patient will lift 25 lbs without  reported increased radicular pain in order to lift furniture     Time  8    Period  Weeks    Status  New    Target Date  03/04/18      PT LONG TERM GOAL #3   Title  Patient will demostrate a 23% limitation on FOTO     Time  8    Period  Weeks    Status  New    Target Date  03/04/18      PT LONG TERM GOAL #4   Title  Patient will increase bilateral cervical rotation to 70 degrees in order to increase safety driving.     Time  8    Period  Weeks    Status  New    Target Date  03/04/18            Plan - 01/28/18 0953    Clinical Impression Statement  Therapy progressed upper body strengthening exercises but adding increase weight and increasing  therabands. Patient tolerated and performed all exercises well. Patient continues to have muscle spasming in the L upper trap but it is decreasing. Therapy will continue to progress strengthening and decrease muscle spasming.     Clinical Presentation  Evolving    Clinical Decision Making  Low    Rehab Potential  Good    PT Frequency  2x / week    PT Duration  6 weeks    PT Next Visit Plan  continue wth manual therapy and strengthening; consider lloking at TOS if symptoms dont change    PT Home Exercise Plan  scap retraction; shoulder extension; supine ER; supine horizontal abduction.     Consulted and Agree with Plan of Care  Patient       Patient will benefit from skilled therapeutic intervention in order to improve the following deficits and impairments:  Pain, Postural dysfunction, Increased muscle spasms, Decreased activity tolerance, Decreased endurance, Decreased range of motion, Decreased strength, Decreased coordination  Visit Diagnosis: Pain in right arm  Cervicalgia  Abnormal posture     Problem List Patient Active Problem List   Diagnosis Date Noted  . Memory disorder 11/24/2017  . Hypertension 09/06/2014  Carolyne Littles PT DPT  01/28/2018   During this treatment session, the therapist was present, participating in and directing the treatment.  Cooper Render DPT  01/28/2018, 9:58 AM     [facility] [address] [place], [place], [address] Phone: [phone]   Fax:  [phone]  Name: Jay Rivas MRN: [id number] Date of Birth: 12-08-1950

## 2018-02-02 ENCOUNTER — Encounter: Payer: Self-pay | Admitting: Physical Therapy

## 2018-02-02 ENCOUNTER — Ambulatory Visit: Payer: Medicare Other | Admitting: Physical Therapy

## 2018-02-02 DIAGNOSIS — M79601 Pain in right arm: Secondary | ICD-10-CM | POA: Diagnosis not present

## 2018-02-02 DIAGNOSIS — R293 Abnormal posture: Secondary | ICD-10-CM | POA: Diagnosis not present

## 2018-02-02 DIAGNOSIS — M542 Cervicalgia: Secondary | ICD-10-CM

## 2018-02-02 NOTE — Therapy (Signed)
[place], [place], [address] Phone: [phone]   Fax:  [phone]  Physical Therapy Treatment  Patient Details  Name: Jay Rivas MRN: [id number] Date of Birth: [birth year] Referring Provider: Stanton Kidney NP    Encounter Date: 02/02/2018  PT End of Session - 02/02/18 1000    Visit Number  6    Number of Visits  12    Date for PT Re-Evaluation  02/17/18    Authorization Type  Medicare triad     PT Start Time  0800    PT Stop Time  0838    PT Time Calculation (min)  38 min    Activity Tolerance  Patient tolerated treatment well    Behavior During Therapy  [facility] for tasks assessed/performed       Past Medical History:  Diagnosis Date  . Hypertension   . Memory disorder 11/24/2017  . Myocardial infarction [facility])    Dr. Terrence Dupont    History reviewed. No pertinent surgical history.  There were no vitals filed for this visit.  Subjective Assessment - 02/02/18 0955    Subjective  Patient continues to report no pain in his shoulder and arm but he is having pain in his hand. He feels like he has increased pain when he is doing th bands.     Limitations  Standing;Walking    Diagnostic tests  MRI: Severeforaminal stenosis     Currently in Pain?  No/denies                       [facility] Adult PT Treatment/Exercise - 02/02/18 0001      Neck Exercises: Standing   Other Standing Exercises  Standing scapular retractions no band     Other Standing Exercises  standing wall slide to retraction 21x10; ball v wall 2x10;       Manual Therapy   Manual Therapy  Soft tissue mobilization;Manual Traction    Soft tissue mobilization  upper traps; cervical paraspinals    Manual Traction  cervical spine      Neck Exercises: Stretches   Upper Trapezius Stretch  3 reps;20 seconds    Levator Stretch  3 reps;20 seconds             PT Education - 02/02/18 0959    Education provided  Yes    Education Details  reviewed exercise technique     Person(s) Educated  Patient    Methods  Explanation    Comprehension  Verbalized understanding;Returned demonstration;Verbal cues required;Tactile cues required       PT Short Term Goals - 01/26/18 2585      PT SHORT TERM GOAL #1   Title  Patient will increase bilateral cervical rotation to 60 degrees     Time  4    Period  Weeks    Status  On-going      PT SHORT TERM GOAL #2   Title  Patient will report pain < 2/10 with no radicualr pain into his right arm     Baseline  no pain assess carryover     Time  4    Period  Weeks    Status  On-going      PT SHORT TERM GOAL #3   Title  Patient will be indepndnet with inital HEP for postur and stretching     Time  4    Period  Weeks    Status  On-going  PT Long Term Goals - 01/07/18 1122      PT LONG TERM GOAL #1   Title  Patient will report no radicular pain or numbness into his right arm in order to perform ADL's     Time  8    Period  Weeks    Status  New    Target Date  03/04/18      PT LONG TERM GOAL #2   Title  Patient will lift 25 lbs without  reported increased radicular pain in order to lift furniture     Time  8    Period  Weeks    Status  New    Target Date  03/04/18      PT LONG TERM GOAL #3   Title  Patient will demostrate a 23% limitation on FOTO     Time  8    Period  Weeks    Status  New    Target Date  03/04/18      PT LONG TERM GOAL #4   Title  Patient will increase bilateral cervical rotation to 70 degrees in order to increase safety driving.     Time  8    Period  Weeks    Status  New    Target Date  03/04/18            Plan - 02/02/18 1000    Clinical Impression Statement  At this point the patients shoulder and neck have improved significantly. He is no longer having pain down into his arm. he is limited by his hand. The exercises appear to be exacerbating his hand and wrist. He will be seeing a surgeon soon.  Therapy will see  him for 1 more visit.     Clinical Presentation  Evolving    Clinical Decision Making  Low    Rehab Potential  Good    PT Frequency  2x / week    PT Duration  6 weeks    PT Treatment/Interventions  ADLs/Self Care Home Management;Cryotherapy;Electrical Stimulation;Moist Heat;Traction;Therapeutic activities;Therapeutic exercise;Neuromuscular re-education;Patient/family education;Manual techniques;Taping;Dry needling    PT Next Visit Plan  continue wth manual therapy and strengthening; consider lloking at TOS if symptoms dont change; potential D/C to HEP     PT Home Exercise Plan  scap retraction; shoulder extension; supine ER; supine horizontal abduction.     Consulted and Agree with Plan of Care  Patient       Patient will benefit from skilled therapeutic intervention in order to improve the following deficits and impairments:  Pain, Postural dysfunction, Increased muscle spasms, Decreased activity tolerance, Decreased endurance, Decreased range of motion, Decreased strength, Decreased coordination  Visit Diagnosis: Pain in right arm  Cervicalgia  Abnormal posture     Problem List Patient Active Problem List   Diagnosis Date Noted  . Memory disorder 11/24/2017  . Hypertension 09/06/2014    Carney Living PT DPT  02/02/2018, 10:06 AM  Cooper Render SPT  02/02/2018 10:07  During this treatment session, the therapist was present, participating in and directing the treatment. [place], [place], [address] Phone: [phone]   Fax:  [phone]  Name: JERIN FRANZEL MRN: [id number] Date of Birth: July 16, 1951

## 2018-02-04 ENCOUNTER — Encounter: Payer: Medicare Other | Admitting: Physical Therapy

## 2018-02-09 ENCOUNTER — Encounter: Payer: Medicare Other | Admitting: Physical Therapy

## 2018-02-11 ENCOUNTER — Encounter: Payer: Self-pay | Admitting: Physical Therapy

## 2018-02-11 ENCOUNTER — Ambulatory Visit: Payer: Medicare Other | Admitting: Physical Therapy

## 2018-02-11 DIAGNOSIS — M79601 Pain in right arm: Secondary | ICD-10-CM

## 2018-02-11 DIAGNOSIS — M542 Cervicalgia: Secondary | ICD-10-CM

## 2018-02-11 DIAGNOSIS — R293 Abnormal posture: Secondary | ICD-10-CM

## 2018-02-11 NOTE — Therapy (Addendum)
[place], [place], [address] Phone: [phone]   Fax:  [phone]  Physical Therapy Treatment/ Discharge  Patient Details  Name: Jay Rivas MRN: [id number] Date of Birth: 09/29/1951 Referring Provider: Stanton Kidney NP    Encounter Date: 02/11/2018  PT End of Session - 02/11/18 0805    Visit Number  7    Number of Visits  12    Date for PT Re-Evaluation  02/17/18    Authorization Type  Medicare triad     PT Start Time  0800    PT Stop Time  0844    PT Time Calculation (min)  44 min    Activity Tolerance  Patient tolerated treatment well    Behavior During Therapy  [facility] for tasks assessed/performed       Past Medical History:  Diagnosis Date  . Hypertension   . Memory disorder 11/24/2017  . Myocardial infarction [facility])    Dr. Terrence Dupont    History reviewed. No pertinent surgical history.  There were no vitals filed for this visit.  Subjective Assessment - 02/11/18 0759    Subjective  Patient reports that he is having no pain in his neck or shoulder today. Patient reports that his HEP has become difficulty due to increased numbness in his hand. Patient says that he has been doing some yard work and that has not increased the pain in his neck.     Limitations  Standing;Walking    Diagnostic tests  MRI: Severeforaminal stenosis     Currently in Pain?  No/denies         [facility] PT Assessment - 02/11/18 0001      AROM   Cervical Flexion  30    Cervical Extension  41    Cervical - Right Rotation  66    Cervical - Left Rotation  50                   [facility] Adult PT Treatment/Exercise - 02/11/18 0001      Neck Exercises: Machines for Strengthening   UBE (Upper Arm Bike)  28mns retro       Neck Exercises: Standing   Other Standing Exercises  Standing scapular retractions no band     Other Standing Exercises   Standing Y 2x0 2lb flexion 2x10 2lb       Neck Exercises: Supine    Other Supine Exercise  Bilateral ER; green; 3x10 bilateral horizontal abduction;green; 3x10; D2 flexion 2x10;      Neck Exercises: Stretches   Upper Trapezius Stretch  --    Levator Stretch  --             PT Education - 02/11/18 0826    Education provided  Yes    Education Details  reviewed HEP     Person(s) Educated  Patient    Methods  Explanation;Demonstration;Tactile cues;Verbal cues    Comprehension  Verbalized understanding;Returned demonstration;Verbal cues required;Tactile cues required       PT Short Term Goals - 02/11/18 0832      PT SHORT TERM GOAL #1   Title  Patient will increase bilateral cervical rotation to 60 degrees     Baseline  Right: 66 Left: 50    Time  4    Period  Weeks    Status  Partially Met    Target Date  02/03/18      PT SHORT TERM GOAL #2   Title  Patient will  report pain < 2/10 with no radicualr pain into his right arm     Baseline  0/10     Time  4    Period  Weeks    Status  Achieved    Target Date  02/03/18      PT SHORT TERM GOAL #3   Title  Patient will be indepndnet with inital HEP for postur and stretching     Baseline  Patient succesful with initial HEP.     Time  4    Period  Weeks    Status  Achieved    Target Date  02/03/18        PT Long Term Goals - 02/11/18 0836      PT LONG TERM GOAL #1   Title  Patient will report no radicular pain or numbness into his right arm in order to perform ADL's     Baseline  Patient reports that he has a had a significant decrease in occurance but it does happen occasionally.    Time  8    Period  Weeks    Status  Partially Met    Target Date  03/04/18      PT LONG TERM GOAL #2   Title  Patient will lift 25 lbs without  reported increased radicular pain in order to lift furniture     Baseline  patient reports no increase in symptoms     Time  8    Period  Weeks    Status  Achieved    Target Date  03/04/18      PT LONG TERM GOAL #4   Baseline  Patient still limited in  cervical rotation; Right:66/Left:50    Time  8    Period  Weeks    Status  Not Met    Target Date  03/04/18            Plan - 02/11/18 5364    Clinical Impression Statement  Patient continues to show no increase in symptoms in his neck and shoulder. He is still having symptoms in his hand and wrist that are unrelated to his previous neck pain. Patient states that he plans to go forward with surgery for his wrist. Therapy discussed discharge with patient and reviewed independent HEP. Patient feels comfortable to discharge and continue exercises on his own.     Clinical Presentation  Evolving    Clinical Decision Making  Low    Rehab Potential  Good    PT Frequency  2x / week    PT Duration  6 weeks    PT Treatment/Interventions  ADLs/Self Care Home Management;Cryotherapy;Electrical Stimulation;Moist Heat;Traction;Therapeutic activities;Therapeutic exercise;Neuromuscular re-education;Patient/family education;Manual techniques;Taping;Dry needling    PT Next Visit Plan  continue wth manual therapy and strengthening; consider lloking at TOS if symptoms dont change; potential D/C to HEP     PT Home Exercise Plan  scap retraction; shoulder extension; supine ER; supine horizontal abduction.     Consulted and Agree with Plan of Care  Patient       Patient will benefit from skilled therapeutic intervention in order to improve the following deficits and impairments:  Pain, Postural dysfunction, Increased muscle spasms, Decreased activity tolerance, Decreased endurance, Decreased range of motion, Decreased strength, Decreased coordination  Visit Diagnosis: Pain in right arm  Cervicalgia  Abnormal posture  PHYSICAL THERAPY DISCHARGE SUMMARY  Visits from Start of Care: 7  Current functional level related to goals / functional outcomes: Improved neck pain and motion  Remaining deficits: Pain in the hand likley unrelated to th neck   Education / Equipment: HEP   Plan: Patient  agrees to discharge.  Patient goals were met. Patient is being discharged due to meeting the stated rehab goals.  ?????       Problem List Patient Active Problem List   Diagnosis Date Noted  . Memory disorder 11/24/2017  . Hypertension 09/06/2014    Carolyne Littles PT DPT  02/11/2018  Cooper Render  SPT  02/11/2018, 11:14 AM  [facility] [address] [place], [place], [address] Phone: [phone]   Fax:  [phone]  Name: JEANPAUL BIEHL MRN: [id number] Date of Birth: December 08, 1950

## 2018-02-12 DIAGNOSIS — I2510 Atherosclerotic heart disease of native coronary artery without angina pectoris: Secondary | ICD-10-CM | POA: Diagnosis not present

## 2018-02-12 DIAGNOSIS — I10 Essential (primary) hypertension: Secondary | ICD-10-CM | POA: Diagnosis not present

## 2018-02-12 DIAGNOSIS — E785 Hyperlipidemia, unspecified: Secondary | ICD-10-CM | POA: Diagnosis not present

## 2018-02-15 DIAGNOSIS — R7309 Other abnormal glucose: Secondary | ICD-10-CM | POA: Diagnosis not present

## 2018-02-15 DIAGNOSIS — E785 Hyperlipidemia, unspecified: Secondary | ICD-10-CM | POA: Diagnosis not present

## 2018-02-15 DIAGNOSIS — I10 Essential (primary) hypertension: Secondary | ICD-10-CM | POA: Diagnosis not present

## 2018-02-15 DIAGNOSIS — I252 Old myocardial infarction: Secondary | ICD-10-CM | POA: Diagnosis not present

## 2018-02-15 DIAGNOSIS — I2510 Atherosclerotic heart disease of native coronary artery without angina pectoris: Secondary | ICD-10-CM | POA: Diagnosis not present

## 2018-02-16 ENCOUNTER — Ambulatory Visit: Payer: Medicare Other | Admitting: Physical Therapy

## 2018-02-18 ENCOUNTER — Ambulatory Visit: Payer: Medicare Other | Admitting: Physical Therapy

## 2018-02-24 DIAGNOSIS — G5603 Carpal tunnel syndrome, bilateral upper limbs: Secondary | ICD-10-CM | POA: Diagnosis not present

## 2018-02-24 DIAGNOSIS — R269 Unspecified abnormalities of gait and mobility: Secondary | ICD-10-CM | POA: Insufficient documentation

## 2018-03-03 ENCOUNTER — Encounter (HOSPITAL_BASED_OUTPATIENT_CLINIC_OR_DEPARTMENT_OTHER): Payer: Self-pay | Admitting: *Deleted

## 2018-03-03 ENCOUNTER — Other Ambulatory Visit: Payer: Self-pay

## 2018-03-04 ENCOUNTER — Encounter (HOSPITAL_BASED_OUTPATIENT_CLINIC_OR_DEPARTMENT_OTHER)
Admission: RE | Admit: 2018-03-04 | Discharge: 2018-03-04 | Disposition: A | Discharge status: Home / Self Care | Payer: Medicare Other | Source: Ambulatory Visit | Attending: Orthopedic Surgery | Admitting: Orthopedic Surgery

## 2018-03-04 DIAGNOSIS — Z955 Presence of coronary angioplasty implant and graft: Secondary | ICD-10-CM | POA: Insufficient documentation

## 2018-03-04 DIAGNOSIS — Z01818 Encounter for other preprocedural examination: Secondary | ICD-10-CM | POA: Insufficient documentation

## 2018-03-04 DIAGNOSIS — I10 Essential (primary) hypertension: Secondary | ICD-10-CM | POA: Diagnosis not present

## 2018-03-04 DIAGNOSIS — I2510 Atherosclerotic heart disease of native coronary artery without angina pectoris: Secondary | ICD-10-CM | POA: Insufficient documentation

## 2018-03-08 ENCOUNTER — Other Ambulatory Visit: Payer: Self-pay | Admitting: Orthopedic Surgery

## 2018-03-08 NOTE — Anesthesia Preprocedure Evaluation (Addendum)
Anesthesia Evaluation  Patient identified by MRN, date of birth, ID band Patient awake    Reviewed: Allergy & Precautions, NPO status , Patient's Chart, lab work & pertinent test results  Airway Mallampati: II  TM Distance: >3 FB Neck ROM: Full    Dental no notable dental hx.    Pulmonary neg pulmonary ROS, former smoker,    Pulmonary exam normal breath sounds clear to auscultation       Cardiovascular Exercise Tolerance: Good hypertension, negative cardio ROS Normal cardiovascular exam Rhythm:Regular Rate:Normal     Neuro/Psych negative neurological ROS  negative psych ROS   GI/Hepatic negative GI ROS, Neg liver ROS,   Endo/Other  negative endocrine ROS  Renal/GU negative Renal ROS  negative genitourinary   Musculoskeletal negative musculoskeletal ROS (+)   Abdominal   Peds negative pediatric ROS (+)  Hematology negative hematology ROS (+)   Anesthesia Other Findings   Reproductive/Obstetrics negative OB ROS                             Anesthesia Physical Anesthesia Plan  ASA: II  Anesthesia Plan: Regional   Post-op Pain Management:    Induction: Intravenous  PONV Risk Score and Plan: Treatment may vary due to age or medical condition, Ondansetron, Dexamethasone and Scopolamine patch - Pre-op  Airway Management Planned: Mask, Natural Airway and Nasal Cannula  Additional Equipment:   Intra-op Plan:   Post-operative Plan:   Informed Consent: I have reviewed the patients History and Physical, chart, labs and discussed the procedure including the risks, benefits and alternatives for the proposed anesthesia with the patient or authorized representative who has indicated his/her understanding and acceptance.     Plan Discussed with: CRNA  Anesthesia Plan Comments:         Anesthesia Quick Evaluation

## 2018-03-09 ENCOUNTER — Ambulatory Visit (HOSPITAL_BASED_OUTPATIENT_CLINIC_OR_DEPARTMENT_OTHER): Payer: Medicare Other | Admitting: Anesthesiology

## 2018-03-09 ENCOUNTER — Other Ambulatory Visit: Payer: Self-pay

## 2018-03-09 ENCOUNTER — Encounter (HOSPITAL_BASED_OUTPATIENT_CLINIC_OR_DEPARTMENT_OTHER)
Admission: RE | Disposition: A | Discharge status: Home / Self Care | Payer: Self-pay | Source: Ambulatory Visit | Attending: Orthopedic Surgery

## 2018-03-09 ENCOUNTER — Encounter (HOSPITAL_BASED_OUTPATIENT_CLINIC_OR_DEPARTMENT_OTHER): Payer: Self-pay | Admitting: Certified Registered"

## 2018-03-09 ENCOUNTER — Ambulatory Visit (HOSPITAL_BASED_OUTPATIENT_CLINIC_OR_DEPARTMENT_OTHER)
Admission: RE | Admit: 2018-03-09 | Discharge: 2018-03-09 | Disposition: A | Discharge status: Home / Self Care | Payer: Medicare Other | Source: Ambulatory Visit | Attending: Orthopedic Surgery | Admitting: Orthopedic Surgery

## 2018-03-09 DIAGNOSIS — F0390 Unspecified dementia without behavioral disturbance: Secondary | ICD-10-CM | POA: Diagnosis not present

## 2018-03-09 DIAGNOSIS — M65351 Trigger finger, right little finger: Secondary | ICD-10-CM | POA: Diagnosis not present

## 2018-03-09 DIAGNOSIS — Z87891 Personal history of nicotine dependence: Secondary | ICD-10-CM | POA: Insufficient documentation

## 2018-03-09 DIAGNOSIS — I252 Old myocardial infarction: Secondary | ICD-10-CM | POA: Diagnosis not present

## 2018-03-09 DIAGNOSIS — M65331 Trigger finger, right middle finger: Secondary | ICD-10-CM | POA: Insufficient documentation

## 2018-03-09 DIAGNOSIS — G5601 Carpal tunnel syndrome, right upper limb: Secondary | ICD-10-CM | POA: Diagnosis not present

## 2018-03-09 DIAGNOSIS — M4802 Spinal stenosis, cervical region: Secondary | ICD-10-CM | POA: Diagnosis not present

## 2018-03-09 DIAGNOSIS — Z79899 Other long term (current) drug therapy: Secondary | ICD-10-CM | POA: Diagnosis not present

## 2018-03-09 DIAGNOSIS — I10 Essential (primary) hypertension: Secondary | ICD-10-CM | POA: Insufficient documentation

## 2018-03-09 DIAGNOSIS — M65341 Trigger finger, right ring finger: Secondary | ICD-10-CM | POA: Diagnosis not present

## 2018-03-09 DIAGNOSIS — Z880 Allergy status to penicillin: Secondary | ICD-10-CM | POA: Insufficient documentation

## 2018-03-09 DIAGNOSIS — G5621 Lesion of ulnar nerve, right upper limb: Secondary | ICD-10-CM | POA: Insufficient documentation

## 2018-03-09 HISTORY — DX: Unspecified dementia, unspecified severity, without behavioral disturbance, psychotic disturbance, mood disturbance, and anxiety: F03.90

## 2018-03-09 HISTORY — PX: TRIGGER FINGER RELEASE: SHX641

## 2018-03-09 HISTORY — DX: Spinal stenosis, cervical region: M48.02

## 2018-03-09 HISTORY — PX: ULNAR TUNNEL RELEASE: SHX820

## 2018-03-09 HISTORY — PX: CARPAL TUNNEL RELEASE: SHX101

## 2018-03-09 SURGERY — CARPAL TUNNEL RELEASE
Anesthesia: Regional | Site: Wrist | Laterality: Right

## 2018-03-09 MED ORDER — HYDROCODONE-ACETAMINOPHEN 7.5-325 MG PO TABS: 1.0000 | ORAL_TABLET | Freq: Once | ORAL | Status: DC | PRN

## 2018-03-09 MED ORDER — MIDAZOLAM HCL 2 MG/2ML IJ SOLN
INTRAMUSCULAR | Status: AC
  Filled 2018-03-09: qty 2

## 2018-03-09 MED ORDER — SCOPOLAMINE 1 MG/3DAYS TD PT72: 1.0000 | MEDICATED_PATCH | Freq: Once | TRANSDERMAL | Status: DC | PRN

## 2018-03-09 MED ORDER — HYDROMORPHONE HCL 1 MG/ML IJ SOLN: 0.2500 mg | INTRAMUSCULAR | Status: DC | PRN

## 2018-03-09 MED ORDER — VANCOMYCIN HCL IN DEXTROSE 1-5 GM/200ML-% IV SOLN
1000.0000 mg | INTRAVENOUS | Status: AC
  Administered 2018-03-09: 1000 mg via INTRAVENOUS

## 2018-03-09 MED ORDER — LACTATED RINGERS IV SOLN
INTRAVENOUS | Status: DC
  Administered 2018-03-09: 08:00:00 via INTRAVENOUS

## 2018-03-09 MED ORDER — HYDROCODONE-ACETAMINOPHEN 5-325 MG PO TABS: 1.0000 | ORAL_TABLET | Freq: Four times a day (QID) | ORAL | 0 refills | Status: DC | PRN

## 2018-03-09 MED ORDER — PROPOFOL 500 MG/50ML IV EMUL
INTRAVENOUS | Status: DC | PRN
  Administered 2018-03-09: 75 ug/kg/min via INTRAVENOUS

## 2018-03-09 MED ORDER — LIDOCAINE HCL (PF) 1 % IJ SOLN
INTRAMUSCULAR | Status: AC
  Filled 2018-03-09: qty 5

## 2018-03-09 MED ORDER — FENTANYL CITRATE (PF) 100 MCG/2ML IJ SOLN
50.0000 ug | INTRAMUSCULAR | Status: DC | PRN
  Administered 2018-03-09: 50 ug via INTRAVENOUS

## 2018-03-09 MED ORDER — FENTANYL CITRATE (PF) 100 MCG/2ML IJ SOLN
INTRAMUSCULAR | Status: AC
  Filled 2018-03-09: qty 2

## 2018-03-09 MED ORDER — ACETAMINOPHEN 10 MG/ML IV SOLN: 1000.0000 mg | Freq: Once | INTRAVENOUS | Status: DC | PRN

## 2018-03-09 MED ORDER — MEPERIDINE HCL 25 MG/ML IJ SOLN: 6.2500 mg | INTRAMUSCULAR | Status: DC | PRN

## 2018-03-09 MED ORDER — LIDOCAINE HCL (CARDIAC) PF 100 MG/5ML IV SOSY
PREFILLED_SYRINGE | INTRAVENOUS | Status: DC | PRN
  Administered 2018-03-09: 30 mg via INTRAVENOUS

## 2018-03-09 MED ORDER — ONDANSETRON HCL 4 MG/2ML IJ SOLN
INTRAMUSCULAR | Status: DC | PRN
  Administered 2018-03-09: 4 mg via INTRAVENOUS

## 2018-03-09 MED ORDER — ROPIVACAINE HCL 5 MG/ML IJ SOLN
INTRAMUSCULAR | Status: DC | PRN
  Administered 2018-03-09: 30 mg via PERINEURAL

## 2018-03-09 MED ORDER — MIDAZOLAM HCL 2 MG/2ML IJ SOLN: 1.0000 mg | INTRAMUSCULAR | Status: DC | PRN

## 2018-03-09 MED ORDER — CHLORHEXIDINE GLUCONATE 4 % EX LIQD: 60.0000 mL | Freq: Once | CUTANEOUS | Status: DC

## 2018-03-09 MED ORDER — VANCOMYCIN HCL IN DEXTROSE 1-5 GM/200ML-% IV SOLN
INTRAVENOUS | Status: AC
  Filled 2018-03-09: qty 200

## 2018-03-09 MED ORDER — PROMETHAZINE HCL 25 MG/ML IJ SOLN: 6.2500 mg | INTRAMUSCULAR | Status: DC | PRN

## 2018-03-09 MED ORDER — BUPIVACAINE HCL (PF) 0.25 % IJ SOLN
INTRAMUSCULAR | Status: AC
  Filled 2018-03-09: qty 150

## 2018-03-09 SURGICAL SUPPLY — 52 items
BANDAGE COBAN STERILE 2 (GAUZE/BANDAGES/DRESSINGS) ×3 IMPLANT
BLADE MINI RND TIP GREEN BEAV (BLADE) ×2 IMPLANT
BLADE SURG 15 STRL LF DISP TIS (BLADE) ×3 IMPLANT
BLADE SURG 15 STRL SS (BLADE) ×5
BNDG CMPR 9X4 STRL LF SNTH (GAUZE/BANDAGES/DRESSINGS) ×3
BNDG COHESIVE 3X5 TAN STRL LF (GAUZE/BANDAGES/DRESSINGS) ×12 IMPLANT
BNDG ESMARK 4X9 LF (GAUZE/BANDAGES/DRESSINGS) ×5 IMPLANT
BNDG GAUZE ELAST 4 BULKY (GAUZE/BANDAGES/DRESSINGS) ×7 IMPLANT
CHLORAPREP W/TINT 26ML (MISCELLANEOUS) ×5 IMPLANT
CORD BIPOLAR FORCEPS 12FT (ELECTRODE) ×5 IMPLANT
COVER BACK TABLE 60X90IN (DRAPES) ×5 IMPLANT
COVER MAYO STAND STRL (DRAPES) ×5 IMPLANT
CUFF TOURN SGL LL 18 NRW (TOURNIQUET CUFF) ×8 IMPLANT
CUFF TOURNIQUET SINGLE 18IN (TOURNIQUET CUFF) ×3 IMPLANT
DECANTER SPIKE VIAL GLASS SM (MISCELLANEOUS) IMPLANT
DRAPE EXTREMITY T 121X128X90 (DRAPE) ×5 IMPLANT
DRAPE SURG 17X23 STRL (DRAPES) ×5 IMPLANT
DRSG PAD ABDOMINAL 8X10 ST (GAUZE/BANDAGES/DRESSINGS) ×7 IMPLANT
GAUZE SPONGE 4X4 12PLY STRL (GAUZE/BANDAGES/DRESSINGS) ×5 IMPLANT
GAUZE SPONGE 4X4 16PLY XRAY LF (GAUZE/BANDAGES/DRESSINGS) IMPLANT
GAUZE XEROFORM 1X8 LF (GAUZE/BANDAGES/DRESSINGS) ×5 IMPLANT
GLOVE BIO SURGEON STRL SZ 6.5 (GLOVE) ×2 IMPLANT
GLOVE BIO SURGEONS STRL SZ 6.5 (GLOVE) ×2
GLOVE BIOGEL PI IND STRL 8.5 (GLOVE) ×3 IMPLANT
GLOVE BIOGEL PI INDICATOR 8.5 (GLOVE) ×2
GLOVE SURG ORTHO 8.0 STRL STRW (GLOVE) ×5 IMPLANT
GOWN STRL REUS W/ TWL LRG LVL3 (GOWN DISPOSABLE) ×3 IMPLANT
GOWN STRL REUS W/TWL LRG LVL3 (GOWN DISPOSABLE) ×5
GOWN STRL REUS W/TWL XL LVL3 (GOWN DISPOSABLE) ×5 IMPLANT
NDL PRECISIONGLIDE 27X1.5 (NEEDLE) ×3 IMPLANT
NDL SUT 6 .5 CRC .975X.05 MAYO (NEEDLE) IMPLANT
NEEDLE MAYO TAPER (NEEDLE)
NEEDLE PRECISIONGLIDE 27X1.5 (NEEDLE) ×5 IMPLANT
NS IRRIG 1000ML POUR BTL (IV SOLUTION) ×5 IMPLANT
PACK BASIN DAY SURGERY FS (CUSTOM PROCEDURE TRAY) ×5 IMPLANT
PAD CAST 3X4 CTTN HI CHSV (CAST SUPPLIES) ×3 IMPLANT
PAD CAST 4YDX4 CTTN HI CHSV (CAST SUPPLIES) ×3 IMPLANT
PADDING CAST ABS 4INX4YD NS (CAST SUPPLIES) ×2
PADDING CAST ABS COTTON 4X4 ST (CAST SUPPLIES) ×3 IMPLANT
PADDING CAST COTTON 3X4 STRL (CAST SUPPLIES) ×5
PADDING CAST COTTON 4X4 STRL (CAST SUPPLIES)
SPLINT PLASTER CAST XFAST 3X15 (CAST SUPPLIES) IMPLANT
SPLINT PLASTER XTRA FASTSET 3X (CAST SUPPLIES)
STOCKINETTE 4X48 STRL (DRAPES) ×5 IMPLANT
SUT ETHILON 4 0 PS 2 18 (SUTURE) ×9 IMPLANT
SUT VIC AB 2-0 SH 27 (SUTURE) ×5
SUT VIC AB 2-0 SH 27XBRD (SUTURE) ×3 IMPLANT
SUT VICRYL 4-0 PS2 18IN ABS (SUTURE) IMPLANT
SYR BULB 3OZ (MISCELLANEOUS) ×5 IMPLANT
SYR CONTROL 10ML LL (SYRINGE) ×3 IMPLANT
TOWEL OR 17X24 6PK STRL BLUE (TOWEL DISPOSABLE) ×10 IMPLANT
UNDERPAD 30X30 (UNDERPADS AND DIAPERS) ×5 IMPLANT

## 2018-03-09 NOTE — Transfer of Care (Signed)
Immediate Anesthesia Transfer of Care Note  Patient: Jay Rivas  Procedure(s) Performed: RIGHT CARPAL TUNNEL RELEASE (Right Wrist) RELEASE TRIGGER FINGER/A-1 PULLEY RIGHT MIDDLE, RING AND SMALL FINGERS (Right Hand) CUBITAL TUNNEL DECOMPRESSION RIGHT ELBOW, POSSIBLE TRANSPOSITION right ulna NERVE (Right Elbow)  Patient Location: PACU  Anesthesia Type:MAC combined with regional for post-op pain  Level of Consciousness: awake, alert , oriented and patient cooperative  Airway & Oxygen Therapy: Patient Spontanous Breathing and Patient connected to face mask oxygen  Post-op Assessment: Report given to RN and Post -op Vital signs reviewed and stable  Post vital signs: Reviewed and stable  Last Vitals:  Vitals Value Taken Time  BP    Temp    Pulse 54 03/09/2018 10:12 AM  Resp 10 03/09/2018 10:12 AM  SpO2 100 % 03/09/2018 10:12 AM  Vitals shown include unvalidated device data.  Last Pain:  Vitals:   03/09/18 0754  TempSrc: Oral  PainSc: 2       Patients Stated Pain Goal: 0 (29/56/21 3086)  Complications: No apparent anesthesia complications

## 2018-03-09 NOTE — Progress Notes (Signed)
Assisted Dr. Houser with right, ultrasound guided, supraclavicular block. Side rails up, monitors on throughout procedure. See vital signs in flow sheet. Tolerated Procedure well. 

## 2018-03-09 NOTE — Discharge Instructions (Addendum)

## 2018-03-09 NOTE — Anesthesia Postprocedure Evaluation (Signed)
Anesthesia Post Note  Patient: Jay Rivas  Procedure(s) Performed: RIGHT CARPAL TUNNEL RELEASE (Right Wrist) RELEASE TRIGGER FINGER/A-1 PULLEY RIGHT MIDDLE, RING AND SMALL FINGERS (Right Hand) CUBITAL TUNNEL DECOMPRESSION RIGHT ELBOW, POSSIBLE TRANSPOSITION right ulna NERVE (Right Elbow)     Patient location during evaluation: PACU Anesthesia Type: Regional Level of consciousness: awake and alert Pain management: pain level controlled Vital Signs Assessment: post-procedure vital signs reviewed and stable Respiratory status: spontaneous breathing, nonlabored ventilation, respiratory function stable and patient connected to nasal cannula oxygen Cardiovascular status: stable and blood pressure returned to baseline Postop Assessment: no apparent nausea or vomiting Anesthetic complications: no    Last Vitals:  Vitals:   03/09/18 1030 03/09/18 1052  BP:  (!) 148/76  Pulse: (!) 48 (!) 54  Resp: 13 16  Temp:  36.7 C  SpO2: 96% 97%    Last Pain:  Vitals:   03/09/18 1052  TempSrc:   PainSc: 0-No pain                 Barnet Glasgow

## 2018-03-09 NOTE — Anesthesia Procedure Notes (Signed)
Procedure Name: MAC Date/Time: 03/09/2018 8:40 AM Performed by: Signe Colt, CRNA Pre-anesthesia Checklist: Patient identified, Emergency Drugs available, Suction available, Patient being monitored and Timeout performed Patient Re-evaluated:Patient Re-evaluated prior to induction Oxygen Delivery Method: Simple face mask

## 2018-03-09 NOTE — Op Note (Signed)
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  Postoperative diagnosis same  Operation 1 decompression carpal canal redo right wrist  2 decompression ulnar nerve right elbow  3.  Release A1 pulley right middle finger  4. release A1 pulley right ring finger  5 release A1 pulley right small finger  Surgeon Daryll Brod  Assistant none  Anesthesia supraclavicular block with IV sedation  Place of surgery [facility]: [facility]  History the patient is a 67 year old male with a history of numbness and tingling into his right hand and catching of his right middle right ring and right small fingers.  Nerve conductions revealed both carpal tunnel recurrence and cubital tunnel syndrome right arm.  This is not responded to conservative treatment.  Is undergone a carpal tunnel release approximately 5 years ago with continued symptoms.  He is desirous of proceeding to have re-release of the carpal canal with decompression of the ulnar nerve release of the A1 pulleys of the right middle ring and small fingers.  He is aware that there is no guarantee to the surgery the possibility of infection recurrence injury to arteries nerves tendons and complete relief of symptoms and dystrophy.  The preoperative area the patient is seen extremity marked by both patient and surgeon antibiotic given along with a supraclavicular block.  Procedure: The patient brought to the operating room where he was prepped and draped in supine position with the right arm free.  A IV sedation was given.  This was done under the direction the anesthesia department.  Was given a prep with ChloraPrep and a 3-minute dry time was lot and a timeout taken to confirm patient procedure.  The limb was exsanguinated with an Esmarch bandage currently placed on the arm was inflated to 250 mmHg.  The trigger fingers were attended  to first.  The small finger was attended to an oblique incision made directly over the A1 pulley carried down through subcutaneous tissue.  Bleeders were electrocauterized as necessary with bipolar.  The A1 pulley was identified.  Retractors placed protecting the neurovascular bundles radially and ulnarly.  An incision was then made on the radial aspect of the A1 pulley.  A small incision made centrally and A2.  Tenosynovial tissue proximally was released.  An accessory muscle to the superficialis was noted.  This was left intact.  2 tendons were separated.  The finger placed through full flexion and no further triggering was noted.  Oblique incision was then made over the A1 pulley of the ring finger right hand.  This was carried down through subcutaneous tissue.  Bleeders were electrocauterized with bipolar bipolar.  The dissection carried down to the A1 pulley.  This was identified.  Retractors were placed protecting neurovascular bundles radially and ulnarly.  An incision was then made on the radial aspect of A1 and the small incision made in A2.  The tenosynovial tissue proximally was separated and bluntly dissected.  The right finger placed through full range of motion no further triggering was noted.  The wound was irrigated.  A separate incision was then made obliquely over the A1 pulley of the middle finger.  This was carried down through subcutaneous tissue again.  Bleeders were again electrocauterized as necessary.  The neurovascular bundles were protected radially and ulnarly.  An incision made on the radial aspect of the A1 pulley small incision made centrally and A2.  The 2 tendons were then  separated breaking adhesions and 18 is in the office tenosynovectomy performed proximally.  The finger placed through full passive range of motion no further triggering was noted.  Wound was irrigated.  These 3 wounds were then closed in the separately with interrupted 4 nylon sutures.  A longitudinal incision  was then made in the palm.  This was carried down through subcutaneous tissue.  Bleeders were again electrocauterized with bipolar.  Significant scarring was immediately present in the palmar fascia.  This was dissected bluntly distally to identify the medial branches of the median nerve distally.  The scar was then elevated proximally and dissection carried with both blunt and sharp dissection using scissors and a Beaver blade to transect the palmar fascia and flexor retinaculum taking care to protect the median nerve radially and the ulnar nerve ulnarly.  Dissection was carried through the entire carpal retinaculum keeping the median nerve of visible at all times.  The dissection was carried proximally retractors placed proximally and a forearm fascia was then released proximally to approximately 3 cm proximal to the wrist crease under direct vision.  The entire nerve was identified.  Significant scarring was present in the epineurium.  An epineural lysis was then performed.  This freed the nerve which was bifid in nature.  The branch pattern in the muscle distally.  All 3 branches were were present common digital nerves distally along with the proper digital nerve to the index finger.  The wound was copiously irrigated with saline.  The skin was then closed with interrupted 4-0 nylon sutures.  The elbow was attended to next.  A short incision approximately 2-1/2 cm in length was made over the medial epicondyle of the right elbow.  This carried down through subcutaneous tissue.  With blunt and sharp dissection it was carried down to Osborne's fascia which was released posteriorly.  The ulnar nerve was identified.  The flexor carpi ulnaris was extremely tight binding the nerve distally.  The skin and subcutaneous tissue was dissected free from the flexor carpi ulnaris muscle belly.  Retractors were placed including a knee retracted to this allowed separation of the superficial fascia.  The muscle belly was then  split.  AK my guide for carpal tunnel release test was then placed between the ulnar nerve and the deep fascia of the flexor carpi ulnaris and using angled ENTs scissors the fascia was released distally for approximately 7 to 8 cm.  The nerve was intact over its entire course.  Attention was then prior directed proximally.  The subcutaneous tissue was then dissected free from the brachial fascia proximally.  The retractor was placed.  The K MI guide was then placed between the nerve and the brachial fascia.  The fascia was then released proximally using the angled ENT scissors upright proximally for approximately 8 cm proximally.  The nerve was intact over its entire course and area compression was noted at the entrance into the flexor carpi ulnaris with a neovascularization and hourglass deformity.  No subluxation to the nerve was present with full flexion of the elbow.  The wound was copiously irrigated with saline.  The Osborne's fascia was then sutured to the posterior skin flap with 2-0 Vicryl sutures.  The subcutaneous tissue was closed with interrupted 2-0 Vicryl and the skin with interrupted 4-0 nylon sutures.  A sterile compressive long-arm dressing was applied.  And deflation of the tourniquet all fingers immediately pink.  He was taken to the recovery room for observation in satisfactory condition.  He will be discharged home to return Kennith Gain agrees with her in 1 week on Norco.

## 2018-03-09 NOTE — Brief Op Note (Signed)
03/09/2018  10:16 AM  PATIENT:  Jay Rivas  67 y.o. male  PRE-OPERATIVE DIAGNOSIS:  RIGHT CARPAL TUNNEL SYNDROME, TRIGGER RIGHT MIDDLE, RING AND SMALL FINGERS, RIGHT CUBITAL TUNNEL  POST-OPERATIVE DIAGNOSIS:  RIGHT CARPAL TUNNEL SYNDROME, TRIGGER RIGHT MIDDLE, RING AND SMALL FINGERS, RIGHT CUBITAL TUNNEL  PROCEDURE:  Procedure(s): RIGHT CARPAL TUNNEL RELEASE (Right) RELEASE TRIGGER FINGER/A-1 PULLEY RIGHT MIDDLE, RING AND SMALL FINGERS (Right) CUBITAL TUNNEL DECOMPRESSION RIGHT ELBOW, POSSIBLE TRANSPOSITION right ulna NERVE (Right)  SURGEON:  Surgeon(s) and Role:    Daryll Brod, MD - Primary  PHYSICIAN ASSISTANT:   ASSISTANTS: none   ANESTHESIA:   regional and IV sedation  EBL:  62ml  BLOOD ADMINISTERED:none  DRAINS: none   LOCAL MEDICATIONS USED:  NONE  SPECIMEN:  No Specimen  DISPOSITION OF SPECIMEN:  N/A  COUNTS:  YES  TOURNIQUET:   Total Tourniquet Time Documented: Upper Arm (Right) - 69 minutes Total: Upper Arm (Right) - 69 minutes   DICTATION: .Viviann Spare Dictation  PLAN OF CARE: Discharge to home after PACU  PATIENT DISPOSITION:  PACU - hemodynamically stable.

## 2018-03-09 NOTE — H&P (Signed)
Jay Rivas is an 67 y.o. male.   Chief Complaint:numbness right hand HPI: Jay Rivas has a 67 year old right-hand-dominant male who comes in at his own request for consultation regarding numbness tingling and pain in his right greater than left arm. He states all of his fingers are numb. He had a right carpal tunnel release done at [facility] in 2015. This gave him temporary relief. He has no new history of injury. He complains of numbness tingling in the dorsum of his forearm and all of his fingertips. He has a throbbing type pain with a VAS score of 6 7/10. Cold makes this worse. Shopping would also makes it worse. He is awakened 1-2 out of 7 nights. There is no history of injury to his hand or neck. He is not taking anything for this. He also has mild symptoms on his left side.He has had nerve conduction performed along with an injection to his right side which has given him excellent relief of symptoms. He continues to have triggering of his fingers.  He has no history of diabetes thyroid problems or gout. Does have a history of arthritis. Family history is positive diabetes negative for thyroid problems arthritis and gout. He has been tested for diabetes. He apparently has had a recent MRI of his head done with a feeling that he may have had a slight stroke. His nerve conductions have been done by Dr. Thereasa Parkin wrist reveals a motor delay of 5.5 on the right 11.8 on the left no sensory response on the left side of motor sensory delay of 4.3 on his right he has decreased velocity of the ulnar nerves bilaterally. His ultrasound shows enlargement of the right median nerve at 30 mm at the palm on his left 22 mm. He shows enlargement on the right side to the ulnar nerve to 13 mm. Left side is more normal. Have reviewed the nerve conductions with him.         Past Medical History:  Diagnosis Date  . Dementia   . Hypertension   . Memory disorder 11/24/2017  . Myocardial infarction [facility])    Dr.  Terrence Dupont  . Spinal stenosis of cervical region     Past Surgical History:  Procedure Laterality Date  . ABDOMINAL SURGERY    . CARDIAC CATHETERIZATION  2005   stent  . CARPAL TUNNEL RELEASE Right     History reviewed. No pertinent family history. Social History:  reports that he has quit smoking. He has quit using smokeless tobacco. He reports that he drinks about 14.4 oz of alcohol per week. He reports that he does not use drugs.  Allergies:  Allergies  Allergen Reactions  . Penicillin G Anaphylaxis    No medications prior to admission.    No results found for this or any previous visit (from the past 48 hour(s)).  No results found.   Pertinent items are noted in HPI.  There were no vitals taken for this visit.  General appearance: alert, cooperative and appears stated age Head: Normocephalic, without obvious abnormality Neck: no JVD Resp: clear to auscultation bilaterally Cardio: regular rate and rhythm, S1, S2 normal, no murmur, click, rub or gallop GI: soft, non-tender; bowel sounds normal; no masses,  no organomegaly Extremities: extremities normal, atraumatic, no cyanosis or edema, Homans sign is negative, no sign of DVT and no edema, redness or tenderness in the calves or thighs Pulses: 2+ and symmetric Skin: Skin color, texture, turgor normal. No rashes or lesions Neurologic: Grossly normal  Incision/Wound: na  Assessment/Plan Assessment:  1. Bilateral carpal tunnel syndrome  2. Trigger ring finger of right hand  3. Trigger middle finger of right hand  4. Trigger little finger of right hand    Plan: Discussed possibility of surgical decompression of the nerve decompression possible transposition of the ulnar nerve at his elbow and release of the trigger fingers middle ring and small. Pre-peri-and postoperative course are discussed along with risks and complications. He is aware there is no guarantee to the surgery the possibility of infection recurrence  injury to arteries nerves tendons complete relief symptoms dystrophy he is scheduled for carpal tunnel release right hand cubital tunnel decompression right elbow possible transposition with release A1 pulley right middle ring and small fingers.      Jay Rivas,[place] R 03/09/2018, 3:59 AM

## 2018-03-09 NOTE — Anesthesia Procedure Notes (Signed)
Anesthesia Regional Block: Supraclavicular block   Pre-Anesthetic Checklist: ,, timeout performed, Correct Patient, Correct Site, Correct Laterality, Correct Procedure, Correct Position, site marked, Risks and benefits discussed,  Surgical consent,  Pre-op evaluation,  At surgeon's request and post-op pain management  Laterality: Right  Prep: chloraprep       Needles:  Injection technique: Single-shot  Needle Type: Echogenic Stimulator Needle     Needle Length: 5cm  Needle Gauge: 21     Additional Needles:   Procedures:,,,, ultrasound used (permanent image in chart),,,,  Narrative:  Start time: 03/09/2018 8:09 AM End time: 03/09/2018 8:17 AM Injection made incrementally with aspirations every 5 mL.  Performed by: Personally  Anesthesiologist: Barnet Glasgow, MD

## 2018-03-10 ENCOUNTER — Encounter (HOSPITAL_BASED_OUTPATIENT_CLINIC_OR_DEPARTMENT_OTHER): Payer: Self-pay | Admitting: Orthopedic Surgery

## 2018-03-26 ENCOUNTER — Encounter (HOSPITAL_BASED_OUTPATIENT_CLINIC_OR_DEPARTMENT_OTHER): Payer: Self-pay | Admitting: Orthopedic Surgery

## 2018-04-21 IMAGING — MR MR HEAD WO/W CM
12 series · 48 of 48 positions shown · IV contrast (multihance)
Comparison: None.

CLINICAL DATA: Confusion with speech disturbance over the last 4
months.

EXAM:
MRI HEAD WITHOUT AND WITH CONTRAST
TECHNIQUE: Multiplanar, multiecho pulse sequences of the brain and surrounding
structures were obtained without and with intravenous contrast.
CONTRAST:  16mL MULTIHANCE GADOBENATE DIMEGLUMINE 529 MG/ML IV SOLN

[Series 2: T1 · sagittal · 5.0mm · 0.45mm/px · 1 of 21 slices shown]
[im 1/21]
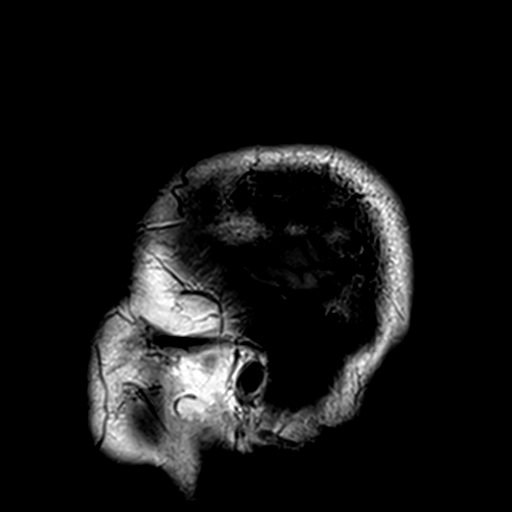

[Series 3: DWI · axial · 3.0mm · 1.80mm/px · z∈[-55,+88]mm · 7 of 100 slices shown (1 of 4)]
[im 1/100]
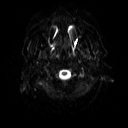
[im 17/100]
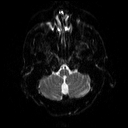
[im 34/100]
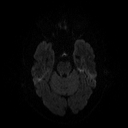
[im 50/100]
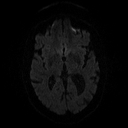
[im 67/100]
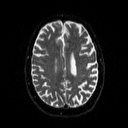
[im 83/100]
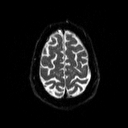
[im 100/100]
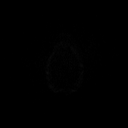

[Series 4: DWI · axial · 3.0mm · 1.80mm/px · z∈[-55,+88]mm · 3 of 48 slices shown (2 of 4)]
[im 1/48]
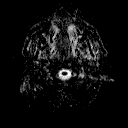
[im 24/48]
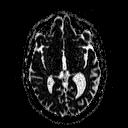
[im 48/48]
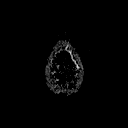

[Series 5: DWI · coronal · 5.0mm · 1.80mm/px · 5 of 72 slices shown (3 of 4)]
[im 1/72]
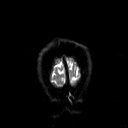
[im 18/72]
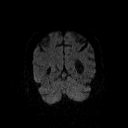
[im 36/72]
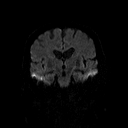
[im 54/72]
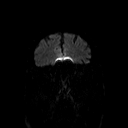
[im 72/72]
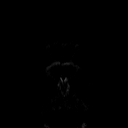

[Series 6: DWI · coronal · 5.0mm · 1.80mm/px · 2 of 36 slices shown (4 of 4)]
[im 1/36]
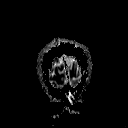
[im 36/36]
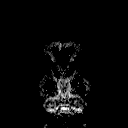

[Series 7: T2 · axial · 5.0mm · 0.51mm/px · z∈[-51,+86]mm · 2 of 22 slices shown (1 of 2)]
[im 1/22]
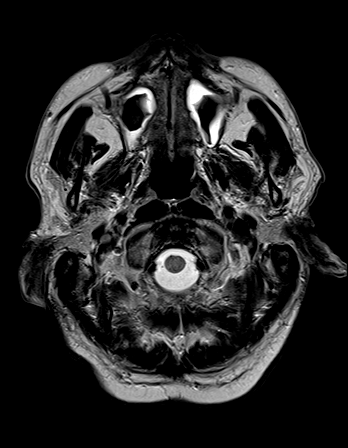
[im 22/22]
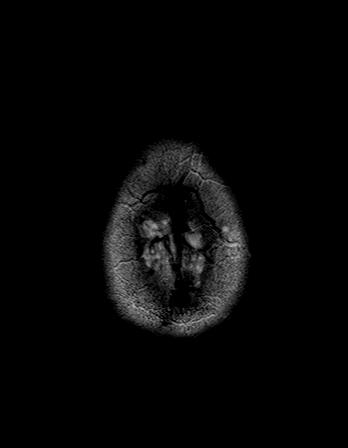

[Series 8: FLAIR · axial · 3.0mm · 0.45mm/px · z∈[-53,+86]mm · 2 of 32 slices shown]
[im 1/32]
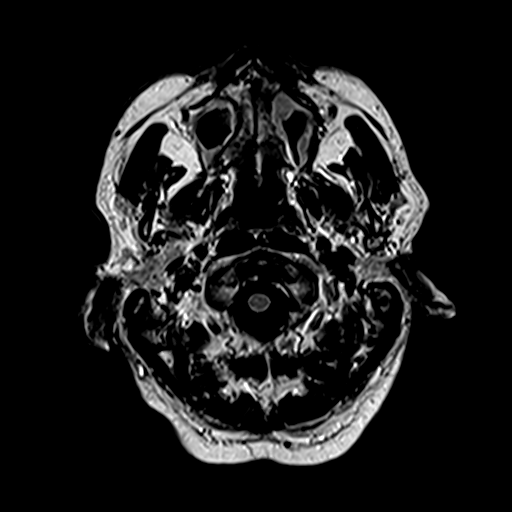
[im 32/32]
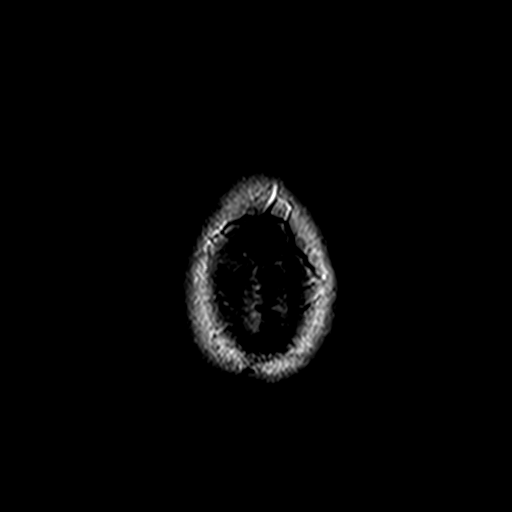

[Series 10: swi_images · axial · 5.0mm · 0.90mm/px · z∈[-54,+87]mm · 2 of 30 slices shown]
[im 1/30]
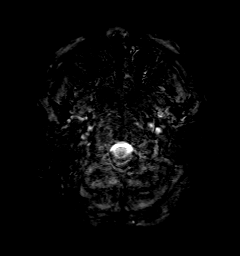
[im 30/30]
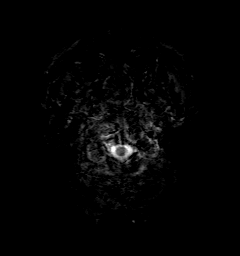

[Series 11: t1_mpr_tra · axial · 1.0mm · 0.75mm/px · z∈[-51,+88]mm · 10 of 144 slices shown (1 of 2)]
[im 1/144]
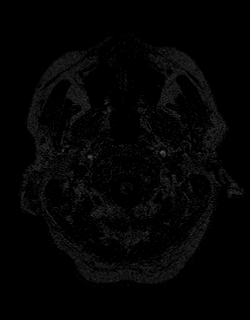
[im 16/144]
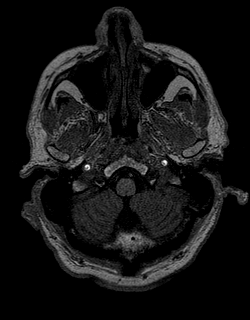
[im 32/144]
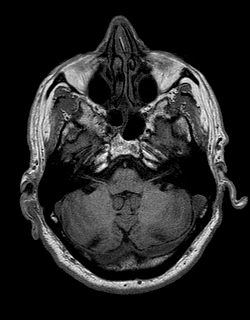
[im 48/144]
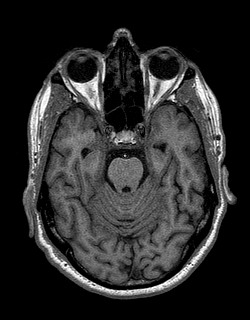
[im 64/144]
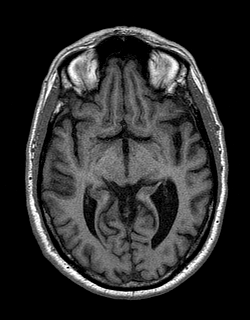
[im 80/144]
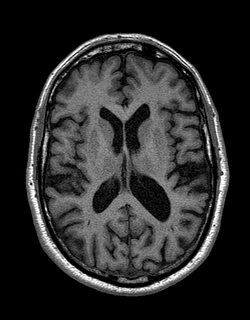
[im 96/144]
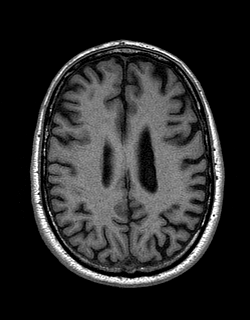
[im 112/144]
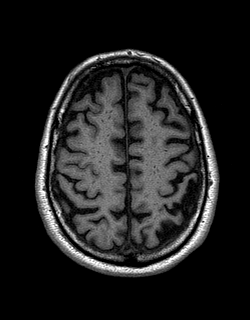
[im 128/144]
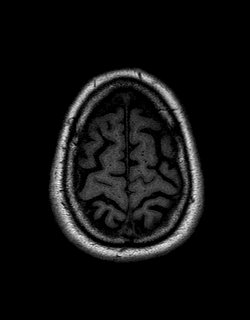
[im 144/144]
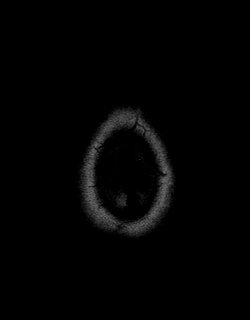

[Series 12: T2 · coronal · 5.0mm · 0.45mm/px · 2 of 27 slices shown (2 of 2)]
[im 1/27]
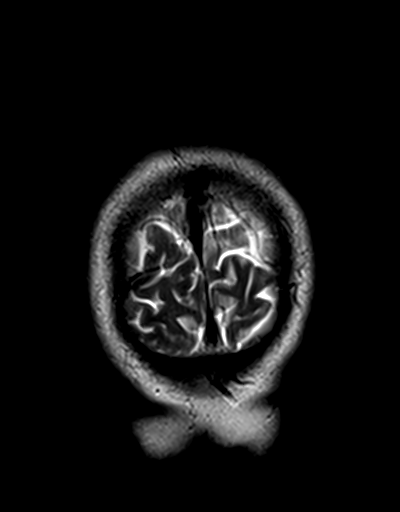
[im 27/27]
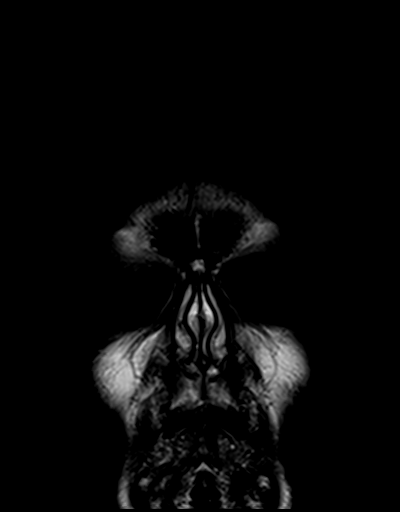

[Series 13: t1_mpr_tra · axial · 1.0mm · 0.75mm/px · z∈[-51,+88]mm · 10 of 144 slices shown (2 of 2)]
[im 1/144]
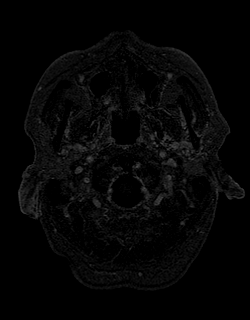
[im 16/144]
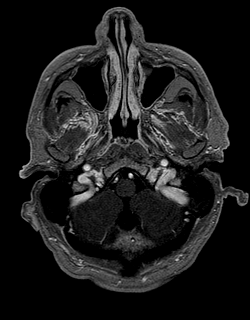
[im 32/144]
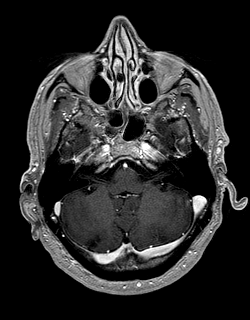
[im 48/144]
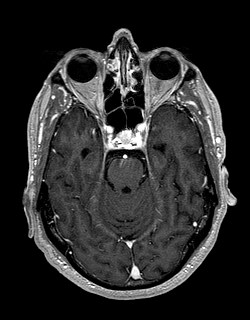
[im 64/144]
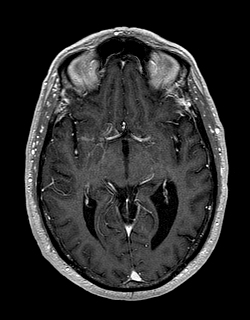
[im 80/144]
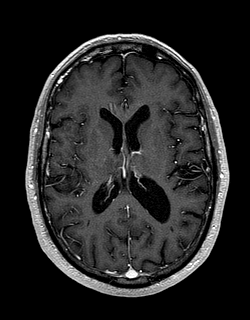
[im 96/144]
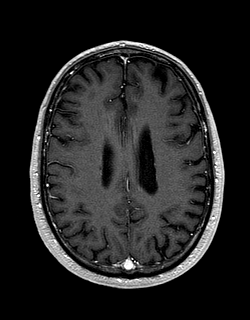
[im 112/144]
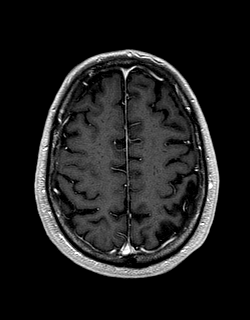
[im 128/144]
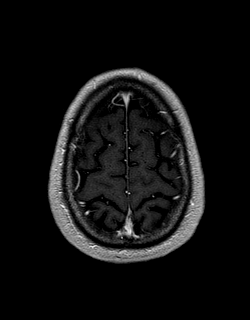
[im 144/144]
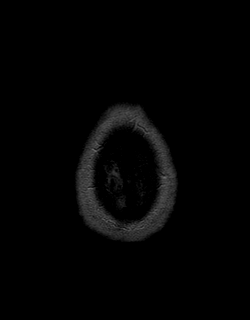

[Series 14: post cor · coronal · 5.0mm · 0.45mm/px · 2 of 27 slices shown]
[im 1/27]
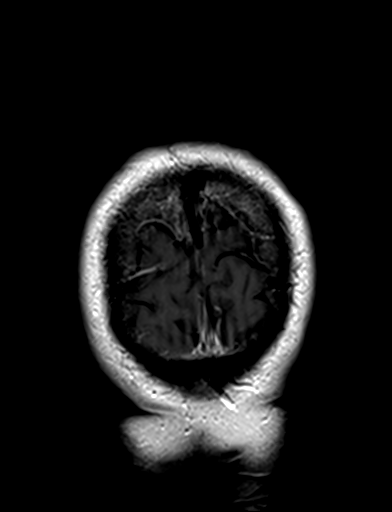
[im 27/27]
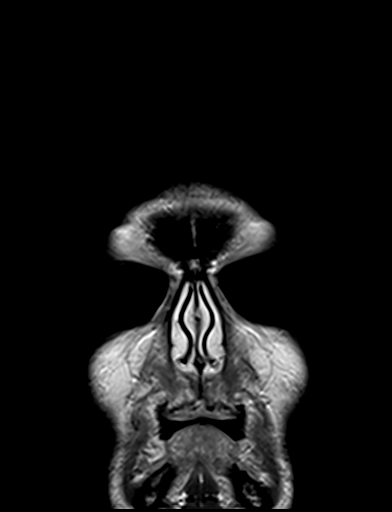

[48 of 48 positions shown; findings below may reference images not displayed]

FINDINGS: Brain: Moderate generalized brain atrophy. Diffusion imaging does
not show any acute or subacute infarction. No focal abnormality
affects the brainstem or cerebellum. Cerebral hemispheres show a few
punctate foci of T2 and FLAIR signal within the white matter. No
evidence of widespread small-vessel disease. No cortical or large
vessel territory insult. No mass lesion, hemorrhage, hydrocephalus
or extra-axial collection.

Vascular: Major vessels at the base of the brain show flow.

Skull and upper cervical spine: Negative

Sinuses/Orbits: Mucosal inflammatory changes of the paranasal
sinuses. No layering fluid.

Other: None
IMPRESSION: Moderate generalized brain atrophy without evidence of acute or
reversible finding. The patient does not show a pattern of
significant small-vessel disease.

## 2018-04-27 DIAGNOSIS — M79641 Pain in right hand: Secondary | ICD-10-CM | POA: Diagnosis not present

## 2018-04-27 DIAGNOSIS — G5603 Carpal tunnel syndrome, bilateral upper limbs: Secondary | ICD-10-CM | POA: Diagnosis not present

## 2018-04-27 DIAGNOSIS — G5621 Lesion of ulnar nerve, right upper limb: Secondary | ICD-10-CM | POA: Diagnosis not present

## 2018-05-03 DIAGNOSIS — G5621 Lesion of ulnar nerve, right upper limb: Secondary | ICD-10-CM | POA: Diagnosis not present

## 2018-05-03 DIAGNOSIS — G5603 Carpal tunnel syndrome, bilateral upper limbs: Secondary | ICD-10-CM | POA: Diagnosis not present

## 2018-05-03 DIAGNOSIS — M79641 Pain in right hand: Secondary | ICD-10-CM | POA: Diagnosis not present

## 2018-05-10 DIAGNOSIS — M25631 Stiffness of right wrist, not elsewhere classified: Secondary | ICD-10-CM | POA: Diagnosis not present

## 2018-05-10 DIAGNOSIS — R531 Weakness: Secondary | ICD-10-CM | POA: Diagnosis not present

## 2018-05-10 DIAGNOSIS — M79641 Pain in right hand: Secondary | ICD-10-CM | POA: Diagnosis not present

## 2018-05-14 DIAGNOSIS — I10 Essential (primary) hypertension: Secondary | ICD-10-CM | POA: Diagnosis not present

## 2018-05-14 DIAGNOSIS — I2510 Atherosclerotic heart disease of native coronary artery without angina pectoris: Secondary | ICD-10-CM | POA: Diagnosis not present

## 2018-05-14 DIAGNOSIS — R7309 Other abnormal glucose: Secondary | ICD-10-CM | POA: Diagnosis not present

## 2018-05-14 DIAGNOSIS — I252 Old myocardial infarction: Secondary | ICD-10-CM | POA: Diagnosis not present

## 2018-05-20 DIAGNOSIS — M25631 Stiffness of right wrist, not elsewhere classified: Secondary | ICD-10-CM | POA: Diagnosis not present

## 2018-05-20 DIAGNOSIS — M79641 Pain in right hand: Secondary | ICD-10-CM | POA: Diagnosis not present

## 2018-05-20 DIAGNOSIS — R531 Weakness: Secondary | ICD-10-CM | POA: Diagnosis not present

## 2018-05-21 ENCOUNTER — Encounter: Payer: Self-pay | Admitting: Family Medicine

## 2018-05-21 ENCOUNTER — Ambulatory Visit (INDEPENDENT_AMBULATORY_CARE_PROVIDER_SITE_OTHER): Payer: Medicare Other | Admitting: Family Medicine

## 2018-05-21 VITALS — BP 134/72 | HR 60 | Temp 97.8°F | Resp 14 | Ht 68.0 in | Wt 168.0 lb

## 2018-05-21 DIAGNOSIS — F0390 Unspecified dementia without behavioral disturbance: Secondary | ICD-10-CM

## 2018-05-21 DIAGNOSIS — K4090 Unilateral inguinal hernia, without obstruction or gangrene, not specified as recurrent: Secondary | ICD-10-CM | POA: Diagnosis not present

## 2018-05-21 DIAGNOSIS — R413 Other amnesia: Secondary | ICD-10-CM

## 2018-05-21 DIAGNOSIS — R4789 Other speech disturbances: Secondary | ICD-10-CM

## 2018-05-21 NOTE — Progress Notes (Signed)
Subjective:    Patient ID: Jay Rivas, male    DOB: 1950/11/14, 67 y.o.   MRN: [id number]  HPI  09/29/17 Patient is a very pleasant 67 year old Caucasian male with a past medical history of coronary artery disease status post myocardial infarction in the remote past as well as hypertension who presents today with his wife for memory problems.  There seems to have been a sudden change approximately 4 months ago.  Around that time, the patient and the patient's wife noticed word finding difficulties while speaking.  He will frequently forget or be unable to complete his sentences.  He will know what he wants to say but have a difficult time saying the word.  He also reports a difficulty naming objects and forgetting the name of common items.  Around the same time, the patient has noticed generalized memory problems.  He frequently forgets items loses his keys, loses wallet.  He is still able to manage his activities of daily living and independent activities of daily living.  He denies any episodes of forgetting to turn off the stove or forgetting to pay a bill..  However there have been also a other neurologic symptom.  He is experience myoclonic jerks in his right upper extremities.  He is lost the dexterity in his right hand.  He has been diagnosed with carpal tunnel syndrome and is even had surgery for this however he has not regained the strength in his right hand and reports numbness and tingling in all of his fingertips on the right hand along with weakness in the right arm.  On neurologic exam today, cranial nerves II through XII are grossly intact with muscle strength 5/5 equal and symmetric in all extremities except for the right arm.  In the right arm, he has lost the fine motor control in his right hand and a sensation in his right hand.  He also has some mild weakness with elbow flexion and extension.  On cerebellar testing, the patient has a difficult time with finger to nose testing.  He also  has a difficult time with heel to toe gait testing.  Mini-Mental status exam was performed today in the office.  Patient incorrectly states that the year is 2009.  He is able to correctly name the month but not the day of the week nor the date.  He is able to score 5 out of 5 in location.  After 3 chances, the patient is able to recall 3 items.  5 minutes later, he is only able to recall 2 out of the 3 items.  He is unable to perform serial sevens and does not even attempt to perform serial sevens.  He is also unable to spell world in reverse.  On clock drawing, the patient is unable to draw all a clock face with the correct time.  Furthermore he has a noticeable essential tremor with a very misshapen clock face due to the tremor.  He is also unable to look at a clock that I have drawn for him and tell me the correct time.  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  This is a noticeable decline in his baseline mental status.  At that time, my plan was: Patient has noticed a noticeable decline in his mental status over the last 4 months.  Given the word finding difficulties and speech difficulties, I am concerned about possible ischemic stroke in the left hemisphere.  I am also concerned about possible ischemic  stroke or underlying cerebral pathology in the left hemisphere due to the loss of dexterity in his right hand, the weakness in his right arm, along with myoclonic jerks in his right arm.  Therefore I am not comfortable simply calling this dementia/Alzheimer's dementia.  I believe a more thorough diagnostic workup needs to take place to rule out underlying vascular issues in the brain particularly given his previous history of myocardial infarction.  Therefore I will schedule the patient for an MRI of the brain.  I will also check a vitamin B12 level, TSH, CMP, and a CBC.  I will await the results of his lab studies along with MRI prior to determining the next course of treatment between  medication such as Aricept and/or Namenda versus focusing on secondary prevention of future CVA.  10/22/17 Lab work showed a normal TSH. It showed a normal vitamin B12 level. MRI revealed moderate generalized atrophy of the brain but no evidence of stroke, hemorrhage, or brain tumor. Workup to date has been concerning for dementia, most likely Alzheimer's type. They're here today to discuss further.  At that time, my plan was: I have recommended Aricept 5 mg a day and recheck in one month. I suspect that he has early signs of Alzheimer's dementia. Patient's family would like to follow-up with neurology for a second opinion which I will happily arrange. I would recommend increasing the dose of Aricept as tolerated and memory loss worsens, adding Namenda to prevent further deterioration as long as possible. Spent more than 20 minutes with the family today discussing this. I will gladly consult neurology for a second opinion per their request  11/20/17 Patient is currently on Aricept 5 mg a day. Since starting the medication, he has had more vivid dreams. However he denies any insomnia. He denies any hallucination. He denies any nausea or vomiting or diarrhea. He denies any trouble sleeping. He seems to be tolerating the medication well. Obviously it is too soon to detect any change in behavior that he is comfortable taking the medication. They have an appointment scheduled to see a neurologist next week. They're here today to discuss uptitrating Aricept.  AT that time, my plan was: Behavior shows no examples of hallucinations or features concerning for lewy body dementia. MRI shows no evidence of previous strokes to support vascular dementia. Physical exam does not support Parkinson's disease. Therefore I believe the patient has early signs of Alzheimer's disease. Honestly, it is difficult to see any abnormalities just simply talking to the patient. However both the patient and the wife states that they can  tell a change over the last few years particularly this last year. Biggest concerns continue to be word finding difficulties and short term memory problems.  Will add Aricept 10 mg a day and reassess in 6 months.  I will not add Namenda as long as the patient is stable. If the family and patient noticed deterioration, consider adding Namenda at that point  05/21/18  Patient is here today for follow-up.  Subjectively he feels that he is doing better since his last visit.  He has not witnessed any deterioration in his memory raising the question of the diagnosis's accuracy.  However as I question him further, it is obvious that the patient is having some short-term memory problems.  He is also having a difficult time answering questions.  He is here today unaccompanied.  First I asked him how far he completed in school.  He first states fifth grade.  He  then states ninth grade.  Later he states 10th grade.  He seems uncertain of exactly how far he completed school.  He is able to tell me the correct date.  However he takes a fair amount of time to remember the name of the month as though he was having a difficult time thinking of the name.  He also has a difficult time telling me the year.  He stumbles saying 28.Marland Kitchen., 19..., 19... And then corrects himself and says 2019.  He is able to correctly tell me the location quickly.  It takes him 3 attempts to remember the 3 objects I wanted him to recall however after a few minutes he is able to recall all 3.  He is unable to spell world forwards.  Therefore we are unable to try spelling and reverse.  He has an extreme difficult time with serial sevens.  First I asked him to subtract 7 from 100.  He is able to tell me 93.  I then asked him to subtract the same number and he states 90.  When I asked him what number he was subtracting from 93, the patient states 3.  He is unable to remember the job that I signed him to do and is also unable to correctly subtracted in his  head.  Past Medical History:  Diagnosis Date  . Dementia   . Hypertension   . Memory disorder 11/24/2017  . Myocardial infarction [facility])    Dr. Terrence Dupont  . Spinal stenosis of cervical region    Past Surgical History:  Procedure Laterality Date  . ABDOMINAL SURGERY    . CARDIAC CATHETERIZATION  2005   stent  . CARPAL TUNNEL RELEASE Right   . CARPAL TUNNEL RELEASE Right 03/09/2018   Procedure: RIGHT CARPAL TUNNEL RELEASE;  Surgeon: Daryll Brod, MD;  Location: [place];  Service: Orthopedics;  Laterality: Right;  . TRIGGER FINGER RELEASE Right 03/09/2018   Procedure: RELEASE TRIGGER FINGER/A-1 PULLEY RIGHT MIDDLE, RING AND SMALL FINGERS;  Surgeon: Daryll Brod, MD;  Location: [place];  Service: Orthopedics;  Laterality: Right;  . ULNAR TUNNEL RELEASE Right 03/09/2018   Procedure: CUBITAL TUNNEL DECOMPRESSION RIGHT ELBOW;  Surgeon: Daryll Brod, MD;  Location: [place];  Service: Orthopedics;  Laterality: Right;   Current Outpatient Medications on File Prior to Visit  Medication Sig Dispense Refill  . aspirin EC 81 MG tablet Take 81 mg by mouth daily.    . Coconut Oil 1000 MG CAPS Take by mouth.    . Coenzyme Q10 (COQ-10) 200 MG CAPS Take by mouth.    . donepezil (ARICEPT) 10 MG tablet Take 1 tablet (10 mg total) by mouth at bedtime. 30 tablet 5  . losartan (COZAAR) 50 MG tablet Take 50 mg by mouth daily.    . Omega-3 Fatty Acids (FISH OIL) 1000 MG CAPS Take by mouth.     No current facility-administered medications on file prior to visit.    Allergies  Allergen Reactions  . Penicillin G Anaphylaxis   Social History   Socioeconomic History  . Marital status: Married    Spouse name: Not on file  . Number of children: Not on file  . Years of education: Not on file  . Highest education level: Not on file  Occupational History  . Not on file  Social Needs  . Financial resource strain: Not on file  . Food insecurity:    Worry: Not  on file  Inability: Not on file  . Transportation needs:    Medical: Not on file    Non-medical: Not on file  Tobacco Use  . Smoking status: Former Research scientist (life sciences)  . Smokeless tobacco: Former Systems developer  . Tobacco comment: quit 1990  Substance and Sexual Activity  . Alcohol use: Yes    Alcohol/week: 14.4 oz    Types: 24 Cans of beer per week    Comment: social beer only  . Drug use: No  . Sexual activity: Yes  Lifestyle  . Physical activity:    Days per week: Not on file    Minutes per session: Not on file  . Stress: Not on file  Relationships  . Social connections:    Talks on phone: Not on file    Gets together: Not on file    Attends religious service: Not on file    Active member of club or organization: Not on file    Attends meetings of clubs or organizations: Not on file    Relationship status: Not on file  . Intimate partner violence:    Fear of current or ex partner: Not on file    Emotionally abused: Not on file    Physically abused: Not on file    Forced sexual activity: Not on file  Other Topics Concern  . Not on file  Social History Narrative  . Not on file   No family history on file.     Review of Systems  All other systems reviewed and are negative.      Objective:   Physical Exam  Constitutional: He is oriented to person, place, and time. He appears well-developed and well-nourished. No distress.  HENT:  Head: Normocephalic and atraumatic.  Right Ear: External ear normal.  Left Ear: External ear normal.  Nose: Nose normal.  Mouth/Throat: Oropharynx is clear and moist. No oropharyngeal exudate.  Eyes: Pupils are equal, round, and reactive to light. Conjunctivae and EOM are normal. Right eye exhibits no discharge. Left eye exhibits no discharge. No scleral icterus.  Neck: Normal range of motion. Neck supple. No JVD present. No thyromegaly present.  Cardiovascular: Normal rate, regular rhythm, normal heart sounds and intact distal pulses. Exam reveals no  gallop and no friction rub.  No murmur heard. Pulmonary/Chest: Effort normal and breath sounds normal. No respiratory distress. He has no wheezes. He has no rales. He exhibits no tenderness.  Abdominal: Soft. Bowel sounds are normal. He exhibits no distension and no mass. There is no tenderness. There is no rebound and no guarding.  Lymphadenopathy:    He has no cervical adenopathy.  Neurological: He is alert and oriented to person, place, and time. He has normal reflexes. No cranial nerve deficit.  Skin: He is not diaphoretic.  Psychiatric: He has a normal mood and affect. His behavior is normal. Judgment and thought content normal.  Vitals reviewed.  Patient appears to have a direct inguinal hernia in the left inguinal canal.  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  Therefore I will maintain the patient on Aricept 10 mg a day however at the present time because of the stability I do not see the need to add Namenda yet.  I would reassess the patient  in 6 months at a complete physical exam.  We also discussed his direct inguinal hernia and offered the patient a referral to a general surgeon however he politely declines it at the present time

## 2018-05-24 ENCOUNTER — Encounter: Payer: Self-pay | Admitting: Neurology

## 2018-05-24 ENCOUNTER — Ambulatory Visit (INDEPENDENT_AMBULATORY_CARE_PROVIDER_SITE_OTHER): Payer: Medicare Other | Admitting: Neurology

## 2018-05-24 VITALS — BP 148/83 | HR 52 | Ht 68.0 in | Wt 167.0 lb

## 2018-05-24 DIAGNOSIS — R413 Other amnesia: Secondary | ICD-10-CM

## 2018-05-24 MED ORDER — DONEPEZIL HCL 10 MG PO TABS: 10.0000 mg | ORAL_TABLET | Freq: Every day | ORAL | 3 refills | Status: DC

## 2018-05-24 NOTE — Progress Notes (Signed)
Reason for visit: Memory disorder  Jay Rivas is an 67 y.o. male  History of present illness:  Jay Rivas is a 67 year old right-handed white male with a history of a progressive memory disorder.  The patient remains on Aricept, he had vivid dreams when he took the medication at nighttime, but he switched the medication to the morning and he is able to tolerate the drug.  The patient has not had any diarrhea or weight loss.  He is still operating motor vehicle, he indicates that occasionally he may put the vehicle in gear instead of park when he tries to park.  He has not had any motor vehicle accidents, he denies any significant problems with directions with driving.  He is able to remain quite mobile, he has not had any falls.  He still has difficulty with handwriting involving the right arm, he does not spell well, he has a slight tremor.  The patient has not had any changes in his activities of daily living since last seen.  He comes to this office for an evaluation.  He has undergone MRI of the cervical spine that shows multilevel neuroforaminal stenosis.  The patient has had surgery for right carpal tunnel syndrome, he blames all of his handwriting problems on this.  Past Medical History:  Diagnosis Date  . Dementia   . Hypertension   . Memory disorder 11/24/2017  . Myocardial infarction [facility])    Dr. Terrence Dupont  . Spinal stenosis of cervical region     Past Surgical History:  Procedure Laterality Date  . ABDOMINAL SURGERY    . CARDIAC CATHETERIZATION  2005   stent  . CARPAL TUNNEL RELEASE Right   . CARPAL TUNNEL RELEASE Right 03/09/2018   Procedure: RIGHT CARPAL TUNNEL RELEASE;  Surgeon: Daryll Brod, MD;  Location: [place];  Service: Orthopedics;  Laterality: Right;  . TRIGGER FINGER RELEASE Right 03/09/2018   Procedure: RELEASE TRIGGER FINGER/A-1 PULLEY RIGHT MIDDLE, RING AND SMALL FINGERS;  Surgeon: Daryll Brod, MD;  Location: [place];   Service: Orthopedics;  Laterality: Right;  . ULNAR TUNNEL RELEASE Right 03/09/2018   Procedure: CUBITAL TUNNEL DECOMPRESSION RIGHT ELBOW;  Surgeon: Daryll Brod, MD;  Location: [place];  Service: Orthopedics;  Laterality: Right;    History reviewed. No pertinent family history.  Social history:  reports that he has quit smoking. He has quit using smokeless tobacco. He reports that he drinks about 14.4 oz of alcohol per week. He reports that he does not use drugs.    Allergies  Allergen Reactions  . Penicillin G Anaphylaxis    Medications:  Prior to Admission medications   Medication Sig Start Date End Date Taking? Authorizing Provider  aspirin EC 81 MG tablet Take 81 mg by mouth daily.   Yes [provider]  Coconut Oil 1000 MG CAPS Take by mouth.   Yes [provider]  Coenzyme Q10 (COQ-10) 200 MG CAPS Take by mouth.   Yes [provider]  donepezil (ARICEPT) 10 MG tablet Take 1 tablet (10 mg total) by mouth at bedtime. 05/24/18  Yes Kathrynn Ducking, MD  losartan (COZAAR) 50 MG tablet Take 50 mg by mouth daily.   Yes [provider]  Omega-3 Fatty Acids (FISH OIL) 1000 MG CAPS Take by mouth.   Yes [provider]    ROS:  Out of a complete 14 system review of symptoms, the patient complains only of the following symptoms, and  all other reviewed systems are negative.  Memory loss  Blood pressure (!) 148/83, pulse (!) 52, height 5\' 8"  (1.727 m), weight 167 lb (75.8 kg).  Physical Exam  General: The patient is alert and cooperative at the time of the examination.  Skin: No significant peripheral edema is noted.   Neurologic Exam  Mental status: The patient is alert and oriented x 3 at the time of the examination. The Mini-Mental status examination done today shows a total score of 22/30.   Cranial nerves: Facial symmetry is present. Speech is normal, no aphasia or dysarthria is noted. Extraocular movements are  full. Visual fields are full.  Motor: The patient has good strength in all 4 extremities.  Sensory examination: Soft touch sensation is symmetric on the face, arms, and legs.  Coordination: The patient has good finger-nose-finger and heel-to-shin bilaterally.  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  A fine intention tremor is seen with finger-nose-finger bilaterally.  Gait and station: The patient has a relatively normal gait, but the right arm does not swing normally.  Reflexes: Deep tendon reflexes are symmetric in the legs, there may be a slight increase in reflexes in the right arm as compared to the left.   Assessment/Plan:  1.  Progressive memory disturbance  2.  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  The patient has requested a second opinion regarding his underlying condition, I will set up a visit with Dr. Hillery Hunter at [facility].  The patient otherwise will follow-up in 6 months.  A prescription for the Aricept was sent in.  Jill Alexanders MD 05/24/2018 8:45 AM  [facility] [address] [place], [place] [address]  Phone [phone] Fax [phone]

## 2018-05-25 DIAGNOSIS — M79641 Pain in right hand: Secondary | ICD-10-CM | POA: Diagnosis not present

## 2018-05-25 DIAGNOSIS — R531 Weakness: Secondary | ICD-10-CM | POA: Diagnosis not present

## 2018-05-31 DIAGNOSIS — L728 Other follicular cysts of the skin and subcutaneous tissue: Secondary | ICD-10-CM | POA: Diagnosis not present

## 2018-05-31 DIAGNOSIS — D2272 Melanocytic nevi of left lower limb, including hip: Secondary | ICD-10-CM | POA: Diagnosis not present

## 2018-05-31 DIAGNOSIS — L570 Actinic keratosis: Secondary | ICD-10-CM | POA: Diagnosis not present

## 2018-05-31 DIAGNOSIS — D2262 Melanocytic nevi of left upper limb, including shoulder: Secondary | ICD-10-CM | POA: Diagnosis not present

## 2018-05-31 DIAGNOSIS — D225 Melanocytic nevi of trunk: Secondary | ICD-10-CM | POA: Diagnosis not present

## 2018-05-31 DIAGNOSIS — D2261 Melanocytic nevi of right upper limb, including shoulder: Secondary | ICD-10-CM | POA: Diagnosis not present

## 2018-05-31 DIAGNOSIS — D2271 Melanocytic nevi of right lower limb, including hip: Secondary | ICD-10-CM | POA: Diagnosis not present

## 2018-05-31 DIAGNOSIS — X32XXXA Exposure to sunlight, initial encounter: Secondary | ICD-10-CM | POA: Diagnosis not present

## 2018-06-03 DIAGNOSIS — G5621 Lesion of ulnar nerve, right upper limb: Secondary | ICD-10-CM | POA: Diagnosis not present

## 2018-06-03 DIAGNOSIS — R531 Weakness: Secondary | ICD-10-CM | POA: Diagnosis not present

## 2018-06-03 DIAGNOSIS — M25631 Stiffness of right wrist, not elsewhere classified: Secondary | ICD-10-CM | POA: Diagnosis not present

## 2018-06-03 DIAGNOSIS — G5603 Carpal tunnel syndrome, bilateral upper limbs: Secondary | ICD-10-CM | POA: Diagnosis not present

## 2018-06-03 DIAGNOSIS — M79641 Pain in right hand: Secondary | ICD-10-CM | POA: Diagnosis not present

## 2018-06-11 DIAGNOSIS — R531 Weakness: Secondary | ICD-10-CM | POA: Diagnosis not present

## 2018-06-11 DIAGNOSIS — M79641 Pain in right hand: Secondary | ICD-10-CM | POA: Diagnosis not present

## 2018-06-11 DIAGNOSIS — G5621 Lesion of ulnar nerve, right upper limb: Secondary | ICD-10-CM | POA: Diagnosis not present

## 2018-06-11 DIAGNOSIS — G5603 Carpal tunnel syndrome, bilateral upper limbs: Secondary | ICD-10-CM | POA: Diagnosis not present

## 2018-06-11 DIAGNOSIS — M25631 Stiffness of right wrist, not elsewhere classified: Secondary | ICD-10-CM | POA: Diagnosis not present

## 2018-06-17 DIAGNOSIS — M25631 Stiffness of right wrist, not elsewhere classified: Secondary | ICD-10-CM | POA: Diagnosis not present

## 2018-06-17 DIAGNOSIS — R531 Weakness: Secondary | ICD-10-CM | POA: Diagnosis not present

## 2018-06-17 DIAGNOSIS — M79641 Pain in right hand: Secondary | ICD-10-CM | POA: Diagnosis not present

## 2018-06-17 DIAGNOSIS — G5621 Lesion of ulnar nerve, right upper limb: Secondary | ICD-10-CM | POA: Diagnosis not present

## 2018-06-17 DIAGNOSIS — G5603 Carpal tunnel syndrome, bilateral upper limbs: Secondary | ICD-10-CM | POA: Diagnosis not present

## 2018-06-22 DIAGNOSIS — G5603 Carpal tunnel syndrome, bilateral upper limbs: Secondary | ICD-10-CM | POA: Diagnosis not present

## 2018-06-22 DIAGNOSIS — G5621 Lesion of ulnar nerve, right upper limb: Secondary | ICD-10-CM | POA: Diagnosis not present

## 2018-06-22 DIAGNOSIS — R531 Weakness: Secondary | ICD-10-CM | POA: Diagnosis not present

## 2018-06-22 DIAGNOSIS — M79641 Pain in right hand: Secondary | ICD-10-CM | POA: Diagnosis not present

## 2018-07-01 DIAGNOSIS — M79641 Pain in right hand: Secondary | ICD-10-CM | POA: Diagnosis not present

## 2018-07-01 DIAGNOSIS — G5621 Lesion of ulnar nerve, right upper limb: Secondary | ICD-10-CM | POA: Diagnosis not present

## 2018-07-01 DIAGNOSIS — M25631 Stiffness of right wrist, not elsewhere classified: Secondary | ICD-10-CM | POA: Diagnosis not present

## 2018-07-01 DIAGNOSIS — R531 Weakness: Secondary | ICD-10-CM | POA: Diagnosis not present

## 2018-07-07 DIAGNOSIS — M79641 Pain in right hand: Secondary | ICD-10-CM | POA: Diagnosis not present

## 2018-07-07 DIAGNOSIS — R531 Weakness: Secondary | ICD-10-CM | POA: Diagnosis not present

## 2018-07-07 DIAGNOSIS — G5603 Carpal tunnel syndrome, bilateral upper limbs: Secondary | ICD-10-CM | POA: Diagnosis not present

## 2018-07-07 DIAGNOSIS — M25631 Stiffness of right wrist, not elsewhere classified: Secondary | ICD-10-CM | POA: Diagnosis not present

## 2018-07-14 DIAGNOSIS — R531 Weakness: Secondary | ICD-10-CM | POA: Diagnosis not present

## 2018-07-14 DIAGNOSIS — G5603 Carpal tunnel syndrome, bilateral upper limbs: Secondary | ICD-10-CM | POA: Diagnosis not present

## 2018-07-14 DIAGNOSIS — M79641 Pain in right hand: Secondary | ICD-10-CM | POA: Diagnosis not present

## 2018-07-14 DIAGNOSIS — G5621 Lesion of ulnar nerve, right upper limb: Secondary | ICD-10-CM | POA: Diagnosis not present

## 2018-07-14 DIAGNOSIS — M25631 Stiffness of right wrist, not elsewhere classified: Secondary | ICD-10-CM | POA: Diagnosis not present

## 2018-07-20 DIAGNOSIS — R531 Weakness: Secondary | ICD-10-CM | POA: Diagnosis not present

## 2018-07-20 DIAGNOSIS — M79641 Pain in right hand: Secondary | ICD-10-CM | POA: Diagnosis not present

## 2018-07-20 DIAGNOSIS — G5621 Lesion of ulnar nerve, right upper limb: Secondary | ICD-10-CM | POA: Diagnosis not present

## 2018-07-20 DIAGNOSIS — M25631 Stiffness of right wrist, not elsewhere classified: Secondary | ICD-10-CM | POA: Diagnosis not present

## 2018-07-20 DIAGNOSIS — G5603 Carpal tunnel syndrome, bilateral upper limbs: Secondary | ICD-10-CM | POA: Diagnosis not present

## 2018-07-26 DIAGNOSIS — G5602 Carpal tunnel syndrome, left upper limb: Secondary | ICD-10-CM | POA: Diagnosis not present

## 2018-07-26 DIAGNOSIS — G5622 Lesion of ulnar nerve, left upper limb: Secondary | ICD-10-CM | POA: Diagnosis not present

## 2018-07-28 ENCOUNTER — Other Ambulatory Visit: Payer: Self-pay | Admitting: Orthopedic Surgery

## 2018-08-13 DIAGNOSIS — I10 Essential (primary) hypertension: Secondary | ICD-10-CM | POA: Diagnosis not present

## 2018-08-13 DIAGNOSIS — I2510 Atherosclerotic heart disease of native coronary artery without angina pectoris: Secondary | ICD-10-CM | POA: Diagnosis not present

## 2018-08-13 DIAGNOSIS — E785 Hyperlipidemia, unspecified: Secondary | ICD-10-CM | POA: Diagnosis not present

## 2018-08-13 DIAGNOSIS — I252 Old myocardial infarction: Secondary | ICD-10-CM | POA: Diagnosis not present

## 2018-08-13 DIAGNOSIS — R7303 Prediabetes: Secondary | ICD-10-CM | POA: Diagnosis not present

## 2018-10-08 DIAGNOSIS — G5602 Carpal tunnel syndrome, left upper limb: Secondary | ICD-10-CM | POA: Diagnosis not present

## 2018-10-18 DIAGNOSIS — M25631 Stiffness of right wrist, not elsewhere classified: Secondary | ICD-10-CM | POA: Diagnosis not present

## 2018-10-18 DIAGNOSIS — M79641 Pain in right hand: Secondary | ICD-10-CM | POA: Diagnosis not present

## 2018-10-18 DIAGNOSIS — G5603 Carpal tunnel syndrome, bilateral upper limbs: Secondary | ICD-10-CM | POA: Diagnosis not present

## 2018-10-18 DIAGNOSIS — G5602 Carpal tunnel syndrome, left upper limb: Secondary | ICD-10-CM | POA: Diagnosis not present

## 2018-10-18 DIAGNOSIS — R531 Weakness: Secondary | ICD-10-CM | POA: Diagnosis not present

## 2018-10-18 DIAGNOSIS — G5621 Lesion of ulnar nerve, right upper limb: Secondary | ICD-10-CM | POA: Diagnosis not present

## 2018-10-25 ENCOUNTER — Encounter (HOSPITAL_BASED_OUTPATIENT_CLINIC_OR_DEPARTMENT_OTHER): Payer: Self-pay | Admitting: *Deleted

## 2018-10-25 ENCOUNTER — Other Ambulatory Visit: Payer: Self-pay

## 2018-11-01 NOTE — Progress Notes (Signed)
Pt wife came into [facility] requesting CHG soap for her husband to use before his surgery tomorrow. A bottle of CHG soap was given to her for her husband along with instructions on how to use.

## 2018-11-02 ENCOUNTER — Encounter (HOSPITAL_BASED_OUTPATIENT_CLINIC_OR_DEPARTMENT_OTHER)
Admission: RE | Disposition: A | Discharge status: Home / Self Care | Payer: Self-pay | Source: Home / Self Care | Attending: Orthopedic Surgery

## 2018-11-02 ENCOUNTER — Other Ambulatory Visit: Payer: Self-pay

## 2018-11-02 ENCOUNTER — Ambulatory Visit (HOSPITAL_BASED_OUTPATIENT_CLINIC_OR_DEPARTMENT_OTHER): Payer: Medicare Other | Admitting: Certified Registered"

## 2018-11-02 ENCOUNTER — Ambulatory Visit (HOSPITAL_BASED_OUTPATIENT_CLINIC_OR_DEPARTMENT_OTHER)
Admission: RE | Admit: 2018-11-02 | Discharge: 2018-11-02 | Disposition: A | Discharge status: Home / Self Care | Payer: Medicare Other | Attending: Orthopedic Surgery | Admitting: Orthopedic Surgery

## 2018-11-02 ENCOUNTER — Encounter (HOSPITAL_BASED_OUTPATIENT_CLINIC_OR_DEPARTMENT_OTHER): Payer: Self-pay | Admitting: Certified Registered"

## 2018-11-02 DIAGNOSIS — I252 Old myocardial infarction: Secondary | ICD-10-CM | POA: Insufficient documentation

## 2018-11-02 DIAGNOSIS — Z87891 Personal history of nicotine dependence: Secondary | ICD-10-CM | POA: Diagnosis not present

## 2018-11-02 DIAGNOSIS — I10 Essential (primary) hypertension: Secondary | ICD-10-CM | POA: Insufficient documentation

## 2018-11-02 DIAGNOSIS — M1990 Unspecified osteoarthritis, unspecified site: Secondary | ICD-10-CM | POA: Insufficient documentation

## 2018-11-02 DIAGNOSIS — G5602 Carpal tunnel syndrome, left upper limb: Secondary | ICD-10-CM | POA: Diagnosis not present

## 2018-11-02 DIAGNOSIS — F0390 Unspecified dementia without behavioral disturbance: Secondary | ICD-10-CM | POA: Diagnosis not present

## 2018-11-02 DIAGNOSIS — G5622 Lesion of ulnar nerve, left upper limb: Secondary | ICD-10-CM | POA: Diagnosis not present

## 2018-11-02 HISTORY — PX: TENDON TRANSFER: SHX6109

## 2018-11-02 HISTORY — DX: Carpal tunnel syndrome, bilateral upper limbs: G56.03

## 2018-11-02 HISTORY — PX: CARPAL TUNNEL RELEASE: SHX101

## 2018-11-02 HISTORY — PX: ULNAR NERVE TRANSPOSITION: SHX2595

## 2018-11-02 SURGERY — CARPAL TUNNEL RELEASE
Anesthesia: Monitor Anesthesia Care | Site: Wrist | Laterality: Left

## 2018-11-02 MED ORDER — MIDAZOLAM HCL 2 MG/2ML IJ SOLN
INTRAMUSCULAR | Status: AC
  Filled 2018-11-02: qty 2

## 2018-11-02 MED ORDER — PHENYLEPHRINE HCL 10 MG/ML IJ SOLN
INTRAMUSCULAR | Status: DC | PRN
  Administered 2018-11-02 (×2): 40 ug via INTRAVENOUS

## 2018-11-02 MED ORDER — EPINEPHRINE PF 1 MG/10ML IJ SOSY
PREFILLED_SYRINGE | INTRAMUSCULAR | Status: DC | PRN
  Administered 2018-11-02: 100 ug via INTRAVENOUS

## 2018-11-02 MED ORDER — HYDROCODONE-ACETAMINOPHEN 5-325 MG PO TABS: 1.0000 | ORAL_TABLET | Freq: Four times a day (QID) | ORAL | 0 refills | Status: DC | PRN

## 2018-11-02 MED ORDER — FENTANYL CITRATE (PF) 100 MCG/2ML IJ SOLN
INTRAMUSCULAR | Status: AC
  Filled 2018-11-02: qty 2

## 2018-11-02 MED ORDER — LACTATED RINGERS IV SOLN
INTRAVENOUS | Status: DC
  Administered 2018-11-02: 08:00:00 via INTRAVENOUS

## 2018-11-02 MED ORDER — CHLORHEXIDINE GLUCONATE 4 % EX LIQD: 60.0000 mL | Freq: Once | CUTANEOUS | Status: DC

## 2018-11-02 MED ORDER — EPHEDRINE SULFATE 50 MG/ML IJ SOLN
INTRAMUSCULAR | Status: DC | PRN
  Administered 2018-11-02 (×2): 10 mg via INTRAVENOUS

## 2018-11-02 MED ORDER — MIDAZOLAM HCL 2 MG/2ML IJ SOLN: 1.0000 mg | INTRAMUSCULAR | Status: DC | PRN

## 2018-11-02 MED ORDER — CLONIDINE HCL (ANALGESIA) 100 MCG/ML EP SOLN
EPIDURAL | Status: DC | PRN
  Administered 2018-11-02: 50 ug

## 2018-11-02 MED ORDER — PROPOFOL 500 MG/50ML IV EMUL
INTRAVENOUS | Status: DC | PRN
  Administered 2018-11-02: 100 ug/kg/min via INTRAVENOUS

## 2018-11-02 MED ORDER — ONDANSETRON HCL 4 MG/2ML IJ SOLN
INTRAMUSCULAR | Status: DC | PRN
  Administered 2018-11-02: 4 mg via INTRAVENOUS

## 2018-11-02 MED ORDER — BUPIVACAINE HCL (PF) 0.25 % IJ SOLN
INTRAMUSCULAR | Status: AC
  Filled 2018-11-02: qty 30

## 2018-11-02 MED ORDER — VANCOMYCIN HCL IN DEXTROSE 1-5 GM/200ML-% IV SOLN
INTRAVENOUS | Status: AC
  Filled 2018-11-02: qty 200

## 2018-11-02 MED ORDER — LIDOCAINE HCL (CARDIAC) PF 100 MG/5ML IV SOSY
PREFILLED_SYRINGE | INTRAVENOUS | Status: DC | PRN
  Administered 2018-11-02: 30 mg via INTRAVENOUS

## 2018-11-02 MED ORDER — FENTANYL CITRATE (PF) 100 MCG/2ML IJ SOLN
50.0000 ug | INTRAMUSCULAR | Status: DC | PRN
  Administered 2018-11-02: 50 ug via INTRAVENOUS

## 2018-11-02 MED ORDER — SCOPOLAMINE 1 MG/3DAYS TD PT72: 1.0000 | MEDICATED_PATCH | Freq: Once | TRANSDERMAL | Status: DC | PRN

## 2018-11-02 MED ORDER — ROPIVACAINE HCL 7.5 MG/ML IJ SOLN
INTRAMUSCULAR | Status: DC | PRN
  Administered 2018-11-02: 20 mL via PERINEURAL

## 2018-11-02 MED ORDER — VANCOMYCIN HCL IN DEXTROSE 1-5 GM/200ML-% IV SOLN
1000.0000 mg | INTRAVENOUS | Status: AC
  Administered 2018-11-02: 1000 mg via INTRAVENOUS

## 2018-11-02 SURGICAL SUPPLY — 80 items
BAG DECANTER FOR FLEXI CONT (MISCELLANEOUS) IMPLANT
BALL CTTN LRG ABS STRL LF (GAUZE/BANDAGES/DRESSINGS)
BLADE MINI RND TIP GREEN BEAV (BLADE) IMPLANT
BLADE SURG 15 STRL LF DISP TIS (BLADE) ×3 IMPLANT
BLADE SURG 15 STRL SS (BLADE) ×10
BNDG CMPR 9X4 STRL LF SNTH (GAUZE/BANDAGES/DRESSINGS) ×3
BNDG COHESIVE 3X5 TAN STRL LF (GAUZE/BANDAGES/DRESSINGS) ×10 IMPLANT
BNDG ESMARK 4X9 LF (GAUZE/BANDAGES/DRESSINGS) ×5 IMPLANT
BNDG GAUZE ELAST 4 BULKY (GAUZE/BANDAGES/DRESSINGS) ×5 IMPLANT
CHLORAPREP W/TINT 26ML (MISCELLANEOUS) ×5 IMPLANT
CORD BIPOLAR FORCEPS 12FT (ELECTRODE) ×5 IMPLANT
COTTONBALL LRG STERILE PKG (GAUZE/BANDAGES/DRESSINGS) IMPLANT
COVER BACK TABLE 60X90IN (DRAPES) ×5 IMPLANT
COVER MAYO STAND STRL (DRAPES) ×5 IMPLANT
COVER WAND RF STERILE (DRAPES) IMPLANT
CUFF TOURN SGL LL 18 NRW (TOURNIQUET CUFF) ×5 IMPLANT
CUFF TOURNIQUET SINGLE 18IN (TOURNIQUET CUFF) ×5 IMPLANT
DECANTER SPIKE VIAL GLASS SM (MISCELLANEOUS) IMPLANT
DRAIN TLS ROUND 10FR (DRAIN) IMPLANT
DRAPE EXTREMITY T 121X128X90 (DISPOSABLE) ×5 IMPLANT
DRAPE OEC MINIVIEW 54X84 (DRAPES) IMPLANT
DRAPE SURG 17X23 STRL (DRAPES) ×5 IMPLANT
DRSG PAD ABDOMINAL 8X10 ST (GAUZE/BANDAGES/DRESSINGS) ×5 IMPLANT
GAUZE 4X4 16PLY RFD (DISPOSABLE) IMPLANT
GAUZE SPONGE 4X4 12PLY STRL (GAUZE/BANDAGES/DRESSINGS) ×5 IMPLANT
GAUZE XEROFORM 1X8 LF (GAUZE/BANDAGES/DRESSINGS) ×5 IMPLANT
GLOVE BIOGEL PI IND STRL 8.5 (GLOVE) ×3 IMPLANT
GLOVE BIOGEL PI INDICATOR 8.5 (GLOVE) ×2
GLOVE SURG ORTHO 8.0 STRL STRW (GLOVE) ×5 IMPLANT
GLOVE SURG SYN 8.0 (GLOVE) ×5 IMPLANT
GLOVE SURG SYN 8.0 PF PI (GLOVE) IMPLANT
GOWN STRL REUS W/ TWL LRG LVL3 (GOWN DISPOSABLE) ×3 IMPLANT
GOWN STRL REUS W/TWL LRG LVL3 (GOWN DISPOSABLE) ×5
GOWN STRL REUS W/TWL XL LVL3 (GOWN DISPOSABLE) ×5 IMPLANT
K-WIRE .035X4 (WIRE) IMPLANT
LOOP VESSEL MAXI BLUE (MISCELLANEOUS) IMPLANT
NDL KEITH (NEEDLE) IMPLANT
NDL PRECISIONGLIDE 27X1.5 (NEEDLE) IMPLANT
NEEDLE HYPO 22GX1.5 SAFETY (NEEDLE) IMPLANT
NEEDLE KEITH (NEEDLE) IMPLANT
NEEDLE PRECISIONGLIDE 27X1.5 (NEEDLE) IMPLANT
NS IRRIG 1000ML POUR BTL (IV SOLUTION) ×5 IMPLANT
PACK BASIN DAY SURGERY FS (CUSTOM PROCEDURE TRAY) ×5 IMPLANT
PAD CAST 3X4 CTTN HI CHSV (CAST SUPPLIES) ×3 IMPLANT
PAD CAST 4YDX4 CTTN HI CHSV (CAST SUPPLIES) ×3 IMPLANT
PADDING CAST ABS 3INX4YD NS (CAST SUPPLIES)
PADDING CAST ABS 4INX4YD NS (CAST SUPPLIES) ×2
PADDING CAST ABS COTTON 3X4 (CAST SUPPLIES) IMPLANT
PADDING CAST ABS COTTON 4X4 ST (CAST SUPPLIES) ×3 IMPLANT
PADDING CAST COTTON 3X4 STRL (CAST SUPPLIES) ×5
PADDING CAST COTTON 4X4 STRL (CAST SUPPLIES) ×5
SLEEVE SCD COMPRESS KNEE MED (MISCELLANEOUS) ×5 IMPLANT
SPLINT PLASTER CAST XFAST 3X15 (CAST SUPPLIES) IMPLANT
SPLINT PLASTER XTRA FASTSET 3X (CAST SUPPLIES)
STOCKINETTE 4X48 STRL (DRAPES) ×5 IMPLANT
SUT CHROMIC 5 0 P 3 (SUTURE) IMPLANT
SUT ETHIBOND 3-0 V-5 (SUTURE) IMPLANT
SUT ETHILON 4 0 PS 2 18 (SUTURE) ×5 IMPLANT
SUT FIBERWIRE 2-0 18 17.9 3/8 (SUTURE)
SUT FIBERWIRE 4-0 18 TAPR NDL (SUTURE)
SUT MERSILENE 2.0 SH NDLE (SUTURE) IMPLANT
SUT MERSILENE 4 0 P 3 (SUTURE) IMPLANT
SUT PROLENE 2 0 SH DA (SUTURE) IMPLANT
SUT SILK 2 0 PERMA HAND 18 BK (SUTURE) IMPLANT
SUT SILK 4 0 PS 2 (SUTURE) IMPLANT
SUT STEEL 3 0 (SUTURE) IMPLANT
SUT VIC AB 2-0 SH 27 (SUTURE) ×5
SUT VIC AB 2-0 SH 27XBRD (SUTURE) ×3 IMPLANT
SUT VIC AB 3-0 PS1 18 (SUTURE)
SUT VIC AB 3-0 PS1 18XBRD (SUTURE) IMPLANT
SUT VIC AB 4-0 P-3 18XBRD (SUTURE) IMPLANT
SUT VIC AB 4-0 P3 18 (SUTURE)
SUT VICRYL 4-0 PS2 18IN ABS (SUTURE) ×5 IMPLANT
SUTURE FIBERWR 2-0 18 17.9 3/8 (SUTURE) IMPLANT
SUTURE FIBERWR 4-0 18 TAPR NDL (SUTURE) IMPLANT
SYR BULB 3OZ (MISCELLANEOUS) ×5 IMPLANT
SYR CONTROL 10ML LL (SYRINGE) IMPLANT
TOWEL GREEN STERILE FF (TOWEL DISPOSABLE) ×10 IMPLANT
TUBE FEEDING ENTERAL 5FR 16IN (TUBING) IMPLANT
UNDERPAD 30X30 (UNDERPADS AND DIAPERS) ×5 IMPLANT

## 2018-11-02 NOTE — Discharge Instructions (Addendum)

## 2018-11-02 NOTE — Anesthesia Procedure Notes (Signed)
Anesthesia Regional Block: Supraclavicular block   Pre-Anesthetic Checklist: ,, timeout performed, Correct Patient, Correct Site, Correct Laterality, Correct Procedure, Correct Position, site marked, Risks and benefits discussed,  Surgical consent,  Pre-op evaluation,  At surgeon's request and post-op pain management  Laterality: Left  Prep: Maximum Sterile Barrier Precautions used, chloraprep       Needles:  Injection technique: Single-shot  Needle Type: Echogenic Stimulator Needle     Needle Length: 4cm  Needle Gauge: 22     Additional Needles:   Procedures:,,,, ultrasound used (permanent image in chart),,,,  Narrative:  Start time: 11/02/2018 7:59 AM End time: 11/02/2018 8:09 AM Injection made incrementally with aspirations every 5 mL.  Performed by: Personally  Anesthesiologist: Freddrick March, MD  Additional Notes: Monitors applied. No increased pain on injection. No increased resistance to injection. Injection made in 5cc increments. Good needle visualization. Patient tolerated procedure well.

## 2018-11-02 NOTE — Anesthesia Procedure Notes (Signed)
Procedure Name: MAC Date/Time: 11/02/2018 8:30 AM Performed by: Signe Colt, CRNA Pre-anesthesia Checklist: Patient identified, Emergency Drugs available, Suction available, Patient being monitored and Timeout performed Patient Re-evaluated:Patient Re-evaluated prior to induction Oxygen Delivery Method: Simple face mask

## 2018-11-02 NOTE — Brief Op Note (Signed)
11/02/2018  9:53 AM  PATIENT:  Jay Rivas  68 y.o. male  PRE-OPERATIVE DIAGNOSIS:  LEFT CARPAL TUNNEL SYNDROME, LEFT CUBITAL TUNNEL SYNDROME  POST-OPERATIVE DIAGNOSIS:  LEFT CARPAL TUNNEL SYNDROME, LEFT CUBITAL   PROCEDURE:  Procedure(s): LEFT CARPAL TUNNEL RELEASE (Left) LEFT ULNAR NERVE DECOMPRESSION (Left) LEFT CAMITZ TRANSFER (Left)  SURGEON:  Surgeon(s) and Role:    Daryll Brod, MD - Primary  PHYSICIAN ASSISTANT:   ASSISTANTS: none   ANESTHESIA:   regional and IV sedation  EBL:  89ml BLOOD ADMINISTERED:none  DRAINS: none   LOCAL MEDICATIONS USED:  NONE  SPECIMEN:  No Specimen  DISPOSITION OF SPECIMEN:  N/A  COUNTS:  YES  TOURNIQUET:   Total Tourniquet Time Documented: Upper Arm (Left) - 64 minutes Total: Upper Arm (Left) - 64 minutes   DICTATION: .Viviann Spare Dictation  PLAN OF CARE: Discharge to home after PACU  PATIENT DISPOSITION:  PACU - hemodynamically stable.

## 2018-11-02 NOTE — Op Note (Signed)
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   ASSISTANT: none   ANESTHESIA:  Regional with sedation   INTRAVENOUS FLUIDS:  Per anesthesia flow sheet.   ESTIMATED BLOOD LOSS:  Minimal.   COMPLICATIONS:  None.   SPECIMENS:  none   TOURNIQUET TIME:    Total Tourniquet Time Documented: Upper Arm (Left) - 64 minutes Total: Upper Arm (Left) - 64 minutes    DISPOSITION:  Stable to PACU.   INDICATIONS: Patient is a 68 year old male with a history of severe carpal tunnel syndrome bilaterally is undergone release on his right side is admitted now for release on the left with transfer of the palmaris longus for apposition long with cubital tunnel decompression.  Pre-peri-and postoperative course been discussed along with risks and complications.  He is aware there is no guarantee to the surgery the possibility of infection recurrence injury to arteries nerves tendons complete relief symptoms and dystrophy.  In the preoperative area the patient is seen the extremity marked by both patient and surgeon antibiotic given  OPERATIVE COURSE: Patient is brought to the operating room after supraclavicular block was carried out without difficulty in the preoperative area under the the direction of the anesthesia department.  Was prepped using ChloraPrep in the supine position with left arm free.  A three-minute dry time was allowed and a timeout taken to confirm patient procedure.  The limb was exsanguinated with an Esmarch bandage turn placed  on the arm was inflated to 250 mmHg.  A straight incision was made over the medial epicondyle of the left elbow carried down through subcutaneous tissue.  Bleeders were electrocauterized with bipolar.  Dissection was carried down to Osborne's fascia the ulnar nerve was identified posterior to the epicondyle.  Retractors were placed proximally including a knee retractor and the brachial fascia was released for 2 to 3 cm proximal to the elbow under direct vision with the nerve protected with a K MI carpal tunnel release guide and angled ENT scissors.  Attention was directed distally.  The nerve was identified as it entered into the flexor carpi ulnaris which the fascia was split.  The muscle bellies separated.  The deep fascia was then released using the knee retractor K MI guide and angled ENT scissors.  The elbow was fully flexed no subluxation of the nerve was apparent.  The wound was copiously irrigated with saline.  The subcutaneous tissue was closed interrupted 2-0 Vicryl and skin with interrupted 4-0 nylon sutures.  The attention was then directed distally to the carpal canal.  A longitudinal incision was made in the mid palm carried down through subcutaneous tissue.  This carried across the wrist crease in a zigzag manner.  Dissection was carried distally to the metacarpal phalangeal joint crease.  The palmaris longus was identified proximally had no connection to the flexor carpi radialis.  This was related.  The palmar cutaneous branch of the median nerve was identified alongside this.  This was protected.  The palmar fascia was then dissected distally over the thenar eminence to  the small finger distally to the metacarpal phalangeal joint crease.  This was done for each of the fingers releasing the palmar fascia distally maintaining it to the palmaris longus tendon proximally.  The flexor tendon to the ring and small finger was identified at the distal margin of the flexor retinaculum.  The flexor  retinaculum was then released on its ulnar aspect with sharp dissection the distal forearm fascia was then released proximally to direct vision.  The canal was explored.  Recurrent compression of the nerve is apparent.  The motor branch entered in the muscle distally.  A tunnel was then created from an incision made on the radial aspect of the metacarpal phalangeal joint of the thumb.  This tunnel was created using a Bennell tendon retriever.  The palmar fascia was then sutured together forming a tendon slip.  This was done and then delivered distally to the area of the abductor of the thumb.  This was clamped at that level of maintaining it.  The wound for the carpal tunnel release was then copiously irrigated with saline and closed interrupted 4-0 nylon sutures.  A tunnel was then made in the abductor tendon of the thumb.  The palmaris longus tendon and graft was then placed through this and sutured with figure-of-eight 4-0 Mersilene sutures multiple times firmly attaching it to the abductor with the thumb in opposition.  Wound was irrigated and closed erupted 4-0 nylon sutures.  A sterile compressive dressing and splint to the thumb dorsal palmar nature was applied.  On deflation of the tourniquet all fingers immediately pink.  He was taken to the recovery room for observation in satisfactory condition.  He will be discharged home return [place] in 1 week on Tylenol ibuprofen for pain with Norco as a backup.   Daryll Brod, MD Electronically signed, 11/02/18

## 2018-11-02 NOTE — Transfer of Care (Signed)
Immediate Anesthesia Transfer of Care Note  Patient: Jay Rivas  Procedure(s) Performed: LEFT CARPAL TUNNEL RELEASE (Left Wrist) LEFT ULNAR NERVE DECOMPRESSION (Left Elbow) LEFT CAMITZ TRANSFER (Left Hand)  Patient Location: PACU  Anesthesia Type:MAC combined with regional for post-op pain  Level of Consciousness: awake, alert , oriented and patient cooperative  Airway & Oxygen Therapy: Patient Spontanous Breathing and Patient connected to face mask oxygen  Post-op Assessment: Report given to RN and Post -op Vital signs reviewed and stable  Post vital signs: Reviewed and stable  Last Vitals:  Vitals Value Taken Time  BP    Temp    Pulse 74 11/02/2018  9:50 AM  Resp 16 11/02/2018  9:50 AM  SpO2 98 % 11/02/2018  9:50 AM  Vitals shown include unvalidated device data.  Last Pain:  Vitals:   11/02/18 0726  TempSrc: Oral  PainSc: 0-No pain         Complications: No apparent anesthesia complications

## 2018-11-02 NOTE — H&P (Signed)
Jay Rivas is an 68 y.o. male.   Chief Complaint: numbness left hand HPI: Jay Rivas a 68yo  Male who  Iis status post carpal tunnel release on his right side done in 2019. He has had nerve conductions done revealing bilateral carpal tunnel syndrome severe on both sides with more severe on his left than his right. States he continues to have the tremor.  He states that the sensation has returned to his fingers but his use is not as good as he would like it. . Has a very severe carpal tunnel syndrome left side with no response to the sensory component of the nerve in the motor delay of 11.8. Has atrophy of the intrinsics on left   Past Medical History:  Diagnosis Date  . Carpal tunnel syndrome on both sides   . Dementia ([place])   . Hypertension   . Memory disorder 11/24/2017  . Myocardial infarction [facility])    Dr. Terrence Dupont  . Spinal stenosis of cervical region     Past Surgical History:  Procedure Laterality Date  . ABDOMINAL SURGERY    . CARDIAC CATHETERIZATION  2005   stent  . CARPAL TUNNEL RELEASE Right   . CARPAL TUNNEL RELEASE Right 03/09/2018   Procedure: RIGHT CARPAL TUNNEL RELEASE;  Surgeon: Daryll Brod, MD;  Location: [place];  Service: Orthopedics;  Laterality: Right;  . TRIGGER FINGER RELEASE Right 03/09/2018   Procedure: RELEASE TRIGGER FINGER/A-1 PULLEY RIGHT MIDDLE, RING AND SMALL FINGERS;  Surgeon: Daryll Brod, MD;  Location: [place];  Service: Orthopedics;  Laterality: Right;  . ULNAR TUNNEL RELEASE Right 03/09/2018   Procedure: CUBITAL TUNNEL DECOMPRESSION RIGHT ELBOW;  Surgeon: Daryll Brod, MD;  Location: [place];  Service: Orthopedics;  Laterality: Right;    History reviewed. No pertinent family history. Social History:  reports that he has quit smoking. He has quit using smokeless tobacco. He reports current alcohol use of about 24.0 standard drinks of alcohol per week. He reports that he does not use  drugs.  Allergies:  Allergies  Allergen Reactions  . Penicillin G Anaphylaxis    No medications prior to admission.    No results found for this or any previous visit (from the past 48 hour(s)).  No results found.   Pertinent items are noted in HPI.  Height 5\' 8"  (1.727 m), weight 77.1 kg.  General appearance: alert, cooperative and appears stated age Head: Normocephalic, without obvious abnormality Neck: no adenopathy and no JVD Resp: clear to auscultation bilaterally Cardio: regular rate and rhythm, S1, S2 normal, no murmur, click, rub or gallop GI: soft, non-tender; bowel sounds normal; no masses,  no organomegaly Extremities: numbness left hand Pulses: 2+ and symmetric Skin: Skin color, texture, turgor normal. No rashes or lesions Neurologic: Grossly normal Incision/Wound: na  Assessment/Plan Assessment:  1. Carpal tunnel syndrome of left wrist Amb Ref to [facility] Therapy    Plan: He is wondering about proceeding with his left side. We have discussed with him that we are trying to halt the process and allow the nerve the opportunity to improve. He is however again advised that there is no guarantee that this is going to happen. We have advised him on the left side we are trying to again halt the process and give him and his nerve the opportunity to improve. He is this is told that if it continues on his left side he will have third further degradation of the intrinsic function of his  thumb. He would like to see therapy and we will send him for his right side for strengthening. He is aware that his tremor does not come from the carpal tunnel syndrome. He is not displeased with his outcome on his right side he is just wondering about having the left side done. Presently scheduled for January 2020. He has decided he will proceed with it.   Jay Rivas 11/02/2018, 5:17 AM

## 2018-11-02 NOTE — Progress Notes (Signed)
Assisted D. Woodrum with left, ultrasound guided, supraclavicular block. Side rails up, monitors on throughout procedure. See vital signs in flow sheet. Tolerated Procedure well. 

## 2018-11-02 NOTE — Anesthesia Preprocedure Evaluation (Addendum)
Anesthesia Evaluation  Patient identified by MRN, date of birth, ID band Patient awake    Reviewed: Allergy & Precautions, NPO status , Patient's Chart, lab work & pertinent test results  Airway Mallampati: I  TM Distance: >3 FB Neck ROM: Full  Mouth opening: Limited Mouth Opening  Dental no notable dental hx. (+) Edentulous Upper,    Pulmonary neg pulmonary ROS, former smoker,    Pulmonary exam normal breath sounds clear to auscultation       Cardiovascular hypertension, + Past MI and + Cardiac Stents (2005)  Normal cardiovascular exam Rhythm:Regular Rate:Normal  Stress Test 2005 1. Normal myocardial perfusion study without evidence for infarction or ischemia.   2. Normal wall motion with estimated ejection fraction of 57 percent.    Neuro/Psych PSYCHIATRIC DISORDERS Dementia negative neurological ROS     GI/Hepatic negative GI ROS, Neg liver ROS,   Endo/Other  negative endocrine ROS  Renal/GU negative Renal ROS  negative genitourinary   Musculoskeletal  (+) Arthritis , Osteoarthritis,    Abdominal   Peds  Hematology negative hematology ROS (+)   Anesthesia Other Findings Left carpal tunnel syndrome, left cubital tunnel syndrome  Reproductive/Obstetrics                            Anesthesia Physical Anesthesia Plan  ASA: II  Anesthesia Plan: Regional and MAC   Post-op Pain Management:  Regional for Post-op pain   Induction: Intravenous  PONV Risk Score and Plan: 1 and Midazolam, Ondansetron and Dexamethasone  Airway Management Planned: Natural Airway and Simple Face Mask  Additional Equipment:   Intra-op Plan:   Post-operative Plan:   Informed Consent: I have reviewed the patients History and Physical, chart, labs and discussed the procedure including the risks, benefits and alternatives for the proposed anesthesia with the patient or authorized representative who has  indicated his/her understanding and acceptance.   Dental advisory given  Plan Discussed with: CRNA  Anesthesia Plan Comments:         Anesthesia Quick Evaluation

## 2018-11-03 ENCOUNTER — Encounter (HOSPITAL_BASED_OUTPATIENT_CLINIC_OR_DEPARTMENT_OTHER): Payer: Self-pay | Admitting: Orthopedic Surgery

## 2018-11-03 NOTE — Anesthesia Postprocedure Evaluation (Signed)
Anesthesia Post Note  Patient: MARKALE BIRDSELL  Procedure(s) Performed: LEFT CARPAL TUNNEL RELEASE (Left Wrist) LEFT ULNAR NERVE DECOMPRESSION (Left Elbow) LEFT CAMITZ TRANSFER (Left Hand)     Patient location during evaluation: PACU Anesthesia Type: Regional and MAC Level of consciousness: awake and alert Pain management: pain level controlled Vital Signs Assessment: post-procedure vital signs reviewed and stable Respiratory status: spontaneous breathing, nonlabored ventilation, respiratory function stable and patient connected to nasal cannula oxygen Cardiovascular status: stable and blood pressure returned to baseline Postop Assessment: no apparent nausea or vomiting Anesthetic complications: no    Last Vitals:  Vitals:   11/02/18 1011 11/02/18 1035  BP: 129/90 132/83  Pulse: 71 (!) 55  Resp: 14 16  Temp:  36.5 C  SpO2: 93% 96%    Last Pain:  Vitals:   11/03/18 0959  TempSrc:   PainSc: 0-No pain                 Chelsey L Woodrum

## 2018-11-12 DIAGNOSIS — I252 Old myocardial infarction: Secondary | ICD-10-CM | POA: Diagnosis not present

## 2018-11-12 DIAGNOSIS — R7303 Prediabetes: Secondary | ICD-10-CM | POA: Diagnosis not present

## 2018-11-12 DIAGNOSIS — I10 Essential (primary) hypertension: Secondary | ICD-10-CM | POA: Diagnosis not present

## 2018-11-12 DIAGNOSIS — E785 Hyperlipidemia, unspecified: Secondary | ICD-10-CM | POA: Diagnosis not present

## 2018-11-12 DIAGNOSIS — I2510 Atherosclerotic heart disease of native coronary artery without angina pectoris: Secondary | ICD-10-CM | POA: Diagnosis not present

## 2018-11-12 DIAGNOSIS — L728 Other follicular cysts of the skin and subcutaneous tissue: Secondary | ICD-10-CM | POA: Diagnosis not present

## 2018-11-17 DIAGNOSIS — G5602 Carpal tunnel syndrome, left upper limb: Secondary | ICD-10-CM | POA: Diagnosis not present

## 2018-11-17 DIAGNOSIS — M25432 Effusion, left wrist: Secondary | ICD-10-CM | POA: Diagnosis not present

## 2018-11-17 DIAGNOSIS — G5603 Carpal tunnel syndrome, bilateral upper limbs: Secondary | ICD-10-CM | POA: Diagnosis not present

## 2018-11-17 DIAGNOSIS — M25642 Stiffness of left hand, not elsewhere classified: Secondary | ICD-10-CM | POA: Diagnosis not present

## 2018-11-17 DIAGNOSIS — R531 Weakness: Secondary | ICD-10-CM | POA: Diagnosis not present

## 2018-11-17 DIAGNOSIS — M25532 Pain in left wrist: Secondary | ICD-10-CM | POA: Diagnosis not present

## 2018-11-22 ENCOUNTER — Ambulatory Visit (INDEPENDENT_AMBULATORY_CARE_PROVIDER_SITE_OTHER): Payer: Medicare Other | Admitting: Family Medicine

## 2018-11-22 ENCOUNTER — Telehealth: Payer: Self-pay | Admitting: *Deleted

## 2018-11-22 ENCOUNTER — Encounter: Payer: Self-pay | Admitting: Family Medicine

## 2018-11-22 VITALS — BP 136/80 | HR 60 | Temp 98.1°F | Resp 14 | Ht 68.0 in | Wt 174.0 lb

## 2018-11-22 DIAGNOSIS — I252 Old myocardial infarction: Secondary | ICD-10-CM | POA: Diagnosis not present

## 2018-11-22 DIAGNOSIS — F0390 Unspecified dementia without behavioral disturbance: Secondary | ICD-10-CM | POA: Diagnosis not present

## 2018-11-22 DIAGNOSIS — Z125 Encounter for screening for malignant neoplasm of prostate: Secondary | ICD-10-CM

## 2018-11-22 DIAGNOSIS — I10 Essential (primary) hypertension: Secondary | ICD-10-CM

## 2018-11-22 NOTE — Telephone Encounter (Signed)
Received verbal orders for Cologuard.   Order placed via [employer].   Cologuard (Order [phone])

## 2018-11-22 NOTE — Telephone Encounter (Signed)
-----   Message from Susy Frizzle, MD sent at 11/22/2018  8:22 AM EST ----- Needs cologuard

## 2018-11-22 NOTE — Progress Notes (Signed)
Subjective:    Patient ID: Jay Rivas, male    DOB: 25-May-1951, 68 y.o.   MRN: [id number]  Medication Refill     09/29/17 Patient is a very pleasant 68 year old Caucasian male with a past medical history of coronary artery disease status post myocardial infarction in the remote past as well as hypertension who presents today with his wife for memory problems.  There seems to have been a sudden change approximately 4 months ago.  Around that time, the patient and the patient's wife noticed word finding difficulties while speaking.  He will frequently forget or be unable to complete his sentences.  He will know what he wants to say but have a difficult time saying the word.  He also reports a difficulty naming objects and forgetting the name of common items.  Around the same time, the patient has noticed generalized memory problems.  He frequently forgets items loses his keys, loses wallet.  He is still able to manage his activities of daily living and independent activities of daily living.  He denies any episodes of forgetting to turn off the stove or forgetting to pay a bill..  However there have been also a other neurologic symptom.  He is experience myoclonic jerks in his right upper extremities.  He is lost the dexterity in his right hand.  He has been diagnosed with carpal tunnel syndrome and is even had surgery for this however he has not regained the strength in his right hand and reports numbness and tingling in all of his fingertips on the right hand along with weakness in the right arm.  On neurologic exam today, cranial nerves II through XII are grossly intact with muscle strength 5/5 equal and symmetric in all extremities except for the right arm.  In the right arm, he has lost the fine motor control in his right hand and a sensation in his right hand.  He also has some mild weakness with elbow flexion and extension.  On cerebellar testing, the patient has a difficult time with finger to  nose testing.  He also has a difficult time with heel to toe gait testing.  Mini-Mental status exam was performed today in the office.  Patient incorrectly states that the year is 2009.  He is able to correctly name the month but not the day of the week nor the date.  He is able to score 5 out of 5 in location.  After 3 chances, the patient is able to recall 3 items.  5 minutes later, he is only able to recall 2 out of the 3 items.  He is unable to perform serial sevens and does not even attempt to perform serial sevens.  He is also unable to spell world in reverse.  On clock drawing, the patient is unable to draw all a clock face with the correct time.  Furthermore he has a noticeable essential tremor with a very misshapen clock face due to the tremor.  He is also unable to look at a clock that I have drawn for him and tell me the correct time.  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  This is a noticeable decline in his baseline mental status.  At that time, my plan was: Patient has noticed a noticeable decline in his mental status over the last 4 months.  Given the word finding difficulties and speech difficulties, I am concerned about possible ischemic stroke in the left hemisphere.  I am also  concerned about possible ischemic stroke or underlying cerebral pathology in the left hemisphere due to the loss of dexterity in his right hand, the weakness in his right arm, along with myoclonic jerks in his right arm.  Therefore I am not comfortable simply calling this dementia/Alzheimer's dementia.  I believe a more thorough diagnostic workup needs to take place to rule out underlying vascular issues in the brain particularly given his previous history of myocardial infarction.  Therefore I will schedule the patient for an MRI of the brain.  I will also check a vitamin B12 level, TSH, CMP, and a CBC.  I will await the results of his lab studies along with MRI prior to determining the next course  of treatment between medication such as Aricept and/or Namenda versus focusing on secondary prevention of future CVA.  10/22/17 Lab work showed a normal TSH. It showed a normal vitamin B12 level. MRI revealed moderate generalized atrophy of the brain but no evidence of stroke, hemorrhage, or brain tumor. Workup to date has been concerning for dementia, most likely Alzheimer's type. They're here today to discuss further.  At that time, my plan was: I have recommended Aricept 5 mg a day and recheck in one month. I suspect that he has early signs of Alzheimer's dementia. Patient's family would like to follow-up with neurology for a second opinion which I will happily arrange. I would recommend increasing the dose of Aricept as tolerated and memory loss worsens, adding Namenda to prevent further deterioration as long as possible. Spent more than 20 minutes with the family today discussing this. I will gladly consult neurology for a second opinion per their request  11/20/17 Patient is currently on Aricept 5 mg a day. Since starting the medication, he has had more vivid dreams. However he denies any insomnia. He denies any hallucination. He denies any nausea or vomiting or diarrhea. He denies any trouble sleeping. He seems to be tolerating the medication well. Obviously it is too soon to detect any change in behavior that he is comfortable taking the medication. They have an appointment scheduled to see a neurologist next week. They're here today to discuss uptitrating Aricept.  AT that time, my plan was: Behavior shows no examples of hallucinations or features concerning for lewy body dementia. MRI shows no evidence of previous strokes to support vascular dementia. Physical exam does not support Parkinson's disease. Therefore I believe the patient has early signs of Alzheimer's disease. Honestly, it is difficult to see any abnormalities just simply talking to the patient. However both the patient and the wife  states that they can tell a change over the last few years particularly this last year. Biggest concerns continue to be word finding difficulties and short term memory problems.  Will add Aricept 10 mg a day and reassess in 6 months.  I will not add Namenda as long as the patient is stable. If the family and patient noticed deterioration, consider adding Namenda at that point  05/21/18  Patient is here today for follow-up.  Subjectively he feels that he is doing better since his last visit.  He has not witnessed any deterioration in his memory raising the question of the diagnosis's accuracy.  However as I question him further, it is obvious that the patient is having some short-term memory problems.  He is also having a difficult time answering questions.  He is here today unaccompanied.  First I asked him how far he completed in school.  He first states  fifth grade.  He then states ninth grade.  Later he states 10th grade.  He seems uncertain of exactly how far he completed school.  He is able to tell me the correct date.  However he takes a fair amount of time to remember the name of the month as though he was having a difficult time thinking of the name.  He also has a difficult time telling me the year.  He stumbles saying 80.Marland Kitchen., 19..., 19... And then corrects himself and says 2019.  He is able to correctly tell me the location quickly.  It takes him 3 attempts to remember the 3 objects I wanted him to recall however after a few minutes he is able to recall all 3.  He is unable to spell world forwards.  Therefore we are unable to try spelling and reverse.  He has an extreme difficult time with serial sevens.  First I asked him to subtract 7 from 100.  He is able to tell me 93.  I then asked him to subtract the same number and he states 90.  When I asked him what number he was subtracting from 93, the patient states 3.  He is unable to remember the job that I signed him to do and is also unable to correctly  subtracted in his head.  AT that time, my plan was: Although the patient is stable and has not witnessed any progression personally, on his exam I feel that there is definitely signs of short-term memory problems.  Therefore I will maintain the patient on Aricept 10 mg a day however at the present time because of the stability I do not see the need to add Namenda yet.  I would reassess the patient in 6 months at a complete physical exam.  We also discussed his direct inguinal hernia and offered the patient a referral to a general surgeon however he politely declines it at the present time.  11/22/18 Patient is here for follow up. He is due for a flu shot, pneumovax 23, prostate cancer screening, fasting labwork, and a colonoscopy.   Patient politely declines a flu shot as well as Pneumovax 23 today.  I encouraged him to reconsider.  He is due for a colonoscopy however he prefers Cologuard.  When he met with his neurologist, Dr. Jannifer Franklin, Dr. 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  Patient has myoclonic jerks in his right hand that are constant.  He often will spill liquids if he tries to drink.  He denies any weakness in his hand however he reports neuropathic pain radiating up and down his right arm as well as constant coldness and numbness in his right arm.  He had an MRI of the cervical spine that revealed no significant nerve impingement on the right.  He was unable to see the neurologist at [facility] and he is requesting a second opinion to another specialist and cortical basilar degeneration perhaps at [facility].  From a memory standpoint, he denies any further progression of his memory loss.  In fact both he and his wife state that his memory may be somewhat better.  He denies any progression of word finding aphasia.  He denies any falls.  He denies any balance issues.   Past Medical History:  Diagnosis Date  . Carpal tunnel syndrome on both sides   .  Dementia ([place])    diagnosed in jan 2019, pt states medication seems to be helping, A&Ox4  .  Hypertension   . Memory disorder 11/24/2017  . Myocardial infarction [facility])    Dr. Terrence Dupont  . Spinal stenosis of cervical region    Past Surgical History:  Procedure Laterality Date  . ABDOMINAL SURGERY    . CARDIAC CATHETERIZATION  2005   stent  . CARPAL TUNNEL RELEASE Right   . CARPAL TUNNEL RELEASE Right 03/09/2018   Procedure: RIGHT CARPAL TUNNEL RELEASE;  Surgeon: Daryll Brod, MD;  Location: [place];  Service: Orthopedics;  Laterality: Right;  . CARPAL TUNNEL RELEASE Left 11/02/2018   Procedure: LEFT CARPAL TUNNEL RELEASE;  Surgeon: Daryll Brod, MD;  Location: [place];  Service: Orthopedics;  Laterality: Left;  . TENDON TRANSFER Left 11/02/2018   Procedure: LEFT CAMITZ TRANSFER;  Surgeon: Daryll Brod, MD;  Location: [place];  Service: Orthopedics;  Laterality: Left;  . TRIGGER FINGER RELEASE Right 03/09/2018   Procedure: RELEASE TRIGGER FINGER/A-1 PULLEY RIGHT MIDDLE, RING AND SMALL FINGERS;  Surgeon: Daryll Brod, MD;  Location: [place];  Service: Orthopedics;  Laterality: Right;  . ULNAR NERVE TRANSPOSITION Left 11/02/2018   Procedure: LEFT ULNAR NERVE DECOMPRESSION;  Surgeon: Daryll Brod, MD;  Location: [place];  Service: Orthopedics;  Laterality: Left;  . ULNAR TUNNEL RELEASE Right 03/09/2018   Procedure: CUBITAL TUNNEL DECOMPRESSION RIGHT ELBOW;  Surgeon: Daryll Brod, MD;  Location: [place];  Service: Orthopedics;  Laterality: Right;   Current Outpatient Medications on File Prior to Visit  Medication Sig Dispense Refill  . aspirin EC 81 MG tablet Take 81 mg by mouth daily.    . Coconut Oil 1000 MG CAPS Take by mouth.    . Coenzyme Q10 (COQ-10) 200 MG CAPS Take by mouth.    . donepezil (ARICEPT) 10 MG tablet Take 1 tablet (10 mg total) by mouth at bedtime. 90 tablet 3  .  HYDROcodone-acetaminophen (NORCO) 5-325 MG tablet Take 1 tablet by mouth every 6 (six) hours as needed. 20 tablet 0  . losartan (COZAAR) 50 MG tablet Take 50 mg by mouth daily.    . Omega-3 Fatty Acids (FISH OIL) 1000 MG CAPS Take by mouth.     No current facility-administered medications on file prior to visit.    Allergies  Allergen Reactions  . Penicillin G Anaphylaxis   Social History   Socioeconomic History  . Marital status: Married    Spouse name: Not on file  . Number of children: Not on file  . Years of education: Not on file  . Highest education level: Not on file  Occupational History  . Not on file  Social Needs  . Financial resource strain: Not on file  . Food insecurity:    Worry: Not on file    Inability: Not on file  . Transportation needs:    Medical: Not on file    Non-medical: Not on file  Tobacco Use  . Smoking status: Former Research scientist (life sciences)  . Smokeless tobacco: Former Systems developer  . Tobacco comment: quit 1990  Substance and Sexual Activity  . Alcohol use: Yes    Alcohol/week: 24.0 standard drinks    Types: 24 Cans of beer per week    Comment: social beer only  . Drug use: No  . Sexual activity: Yes  Lifestyle  . Physical activity:    Days per week: Not on file    Minutes per session: Not on file  . Stress: Not on file  Relationships  . Social connections:  Talks on phone: Not on file    Gets together: Not on file    Attends religious service: Not on file    Active member of club or organization: Not on file    Attends meetings of clubs or organizations: Not on file    Relationship status: Not on file  . Intimate partner violence:    Fear of current or ex partner: Not on file    Emotionally abused: Not on file    Physically abused: Not on file    Forced sexual activity: Not on file  Other Topics Concern  . Not on file  Social History Narrative   Lives with wife   Right handed    No family history on file.     Review of Systems  All other  systems reviewed and are negative.      Objective:   Physical Exam  Constitutional: He is oriented to person, place, and time. He appears well-developed and well-nourished. No distress.  HENT:  Head: Normocephalic and atraumatic.  Right Ear: External ear normal.  Left Ear: External ear normal.  Nose: Nose normal.  Mouth/Throat: Oropharynx is clear and moist. No oropharyngeal exudate.  Eyes: Pupils are equal, round, and reactive to light. Conjunctivae and EOM are normal. Right eye exhibits no discharge. Left eye exhibits no discharge. No scleral icterus.  Neck: Normal range of motion. Neck supple. No JVD present. No thyromegaly present.  Cardiovascular: Normal rate, regular rhythm, normal heart sounds and intact distal pulses. Exam reveals no gallop and no friction rub.  No murmur heard. Pulmonary/Chest: Effort normal and breath sounds normal. No respiratory distress. He has no wheezes. He has no rales. He exhibits no tenderness.  Abdominal: Soft. Bowel sounds are normal. He exhibits no distension and no mass. There is no abdominal tenderness. There is no rebound and no guarding.  Lymphadenopathy:    He has no cervical adenopathy.  Neurological: He is alert and oriented to person, place, and time. He has normal reflexes. No cranial nerve deficit.  Skin: He is not diaphoretic.  Psychiatric: He has a normal mood and affect. His behavior is normal. Judgment and thought content normal.  Vitals reviewed.         Assessment & Plan:  Dementia without behavioral disturbance, unspecified dementia type ([place])  Essential hypertension - Plan: CBC with Differential/Platelet, COMPLETE METABOLIC PANEL WITH GFR, Lipid panel  Prostate cancer screening - Plan: PSA  History of myocardial infarction  From a neuromuscular standpoint, the patient does have myoclonic jerks in his right hand and right forearm.  He also has fine motor degeneration in his right hand.  Past medical history significant for  carpal tunnel syndrome in the right hand status post surgical correction however this seems to be getting worse.  From a standpoint of his memory as well as his word finding difficulties this is actually getting better.  Therefore I would not change or discontinue Aricept but I would arrange a consultation with a neurologist at [facility] to discuss cortical basilar degeneration to see if they have any other treatment options available.  I have recommended a flu shot as well as Pneumovax 23 which he declined.  I will schedule the patient for Cologuard.  I will screen for prostate cancer with a PSA.  I will check a CBC, CMP, and a fasting lipid panel.  His goal LDL cholesterol is less than 70.

## 2018-11-23 ENCOUNTER — Encounter: Payer: Self-pay | Admitting: Family Medicine

## 2018-11-23 DIAGNOSIS — E785 Hyperlipidemia, unspecified: Secondary | ICD-10-CM | POA: Insufficient documentation

## 2018-11-23 LAB — COMPLETE METABOLIC PANEL WITH GFR
AG Ratio: 2.3 (calc) (ref 1.0–2.5)
ALT: 18 U/L (ref 9–46)
AST: 15 U/L (ref 10–35)
Albumin: 4.8 g/dL (ref 3.6–5.1)
Alkaline phosphatase (APISO): 61 U/L (ref 40–115)
BUN: 14 mg/dL (ref 7–25)
CO2: 31 mmol/L (ref 20–32)
CREATININE: 0.75 mg/dL (ref 0.70–1.25)
Calcium: 9.8 mg/dL (ref 8.6–10.3)
Chloride: 105 mmol/L (ref 98–110)
GFR, Est African American: 111 mL/min/{1.73_m2} (ref >=60)
GFR, Est Non African American: 96 mL/min/{1.73_m2} (ref >=60)
GLOBULIN: 2.1 g/dL (ref 1.9–3.7)
Glucose, Bld: 88 mg/dL (ref 65–99)
Potassium: 4.3 mmol/L (ref 3.5–5.3)
Sodium: 142 mmol/L (ref 135–146)
Total Bilirubin: 0.6 mg/dL (ref 0.2–1.2)
Total Protein: 6.9 g/dL (ref 6.1–8.1)

## 2018-11-23 LAB — CBC WITH DIFFERENTIAL/PLATELET
Absolute Monocytes: 395 cells/uL (ref 200–950)
Basophils Absolute: 50 cells/uL (ref 0–200)
Basophils Relative: 1.2 %
EOS PCT: 5.7 %
Eosinophils Absolute: 239 cells/uL (ref 15–500)
HEMATOCRIT: 40.1 % (ref 38.5–50.0)
Hemoglobin: 13.8 g/dL (ref 13.2–17.1)
Lymphs Abs: 853 cells/uL (ref 850–3900)
MCH: 30.9 pg (ref 27.0–33.0)
MCHC: 34.4 g/dL (ref 32.0–36.0)
MCV: 89.9 fL (ref 80.0–100.0)
MPV: 9.4 fL (ref 7.5–12.5)
Monocytes Relative: 9.4 %
Neutro Abs: 2663 cells/uL (ref 1500–7800)
Neutrophils Relative %: 63.4 %
Platelets: 237 10*3/uL (ref 140–400)
RBC: 4.46 10*6/uL (ref 4.20–5.80)
RDW: 12.5 % (ref 11.0–15.0)
Total Lymphocyte: 20.3 %
WBC: 4.2 10*3/uL (ref 3.8–10.8)

## 2018-11-23 LAB — PSA: PSA: 1.1 ng/mL (ref <=4.0)

## 2018-11-23 LAB — LIPID PANEL
CHOL/HDL RATIO: 3.8 (calc) (ref <5.0)
Cholesterol: 186 mg/dL (ref <200)
HDL: 49 mg/dL (ref >40)
LDL Cholesterol (Calc): 109 mg/dL (calc) — ABNORMAL HIGH
Non-HDL Cholesterol (Calc): 137 mg/dL (calc) — ABNORMAL HIGH (ref <130)
Triglycerides: 159 mg/dL — ABNORMAL HIGH (ref <150)

## 2018-11-24 ENCOUNTER — Ambulatory Visit: Payer: Medicare Other | Admitting: Neurology

## 2018-11-26 DIAGNOSIS — Z1212 Encounter for screening for malignant neoplasm of rectum: Secondary | ICD-10-CM | POA: Diagnosis not present

## 2018-11-26 DIAGNOSIS — Z1211 Encounter for screening for malignant neoplasm of colon: Secondary | ICD-10-CM | POA: Diagnosis not present

## 2018-12-05 LAB — COLOGUARD

## 2018-12-06 NOTE — Telephone Encounter (Signed)
Received the results of Cologuard screening.   Screening noted negative.   A negative result indicates a low likelihood of colorectal cancer is present. Following a negative Cologuard result, the American Cancer Society recommends a Cologuard re-screening interval of 3 years.   Letter sent.   

## 2019-01-03 DIAGNOSIS — G5603 Carpal tunnel syndrome, bilateral upper limbs: Secondary | ICD-10-CM | POA: Diagnosis not present

## 2019-01-03 DIAGNOSIS — M5412 Radiculopathy, cervical region: Secondary | ICD-10-CM | POA: Diagnosis not present

## 2019-01-03 DIAGNOSIS — F0390 Unspecified dementia without behavioral disturbance: Secondary | ICD-10-CM | POA: Diagnosis not present

## 2019-01-03 DIAGNOSIS — G253 Myoclonus: Secondary | ICD-10-CM | POA: Diagnosis not present

## 2019-02-25 DIAGNOSIS — M4802 Spinal stenosis, cervical region: Secondary | ICD-10-CM | POA: Diagnosis not present

## 2019-03-25 DIAGNOSIS — M47812 Spondylosis without myelopathy or radiculopathy, cervical region: Secondary | ICD-10-CM | POA: Diagnosis not present

## 2019-03-25 DIAGNOSIS — R9389 Abnormal findings on diagnostic imaging of other specified body structures: Secondary | ICD-10-CM | POA: Diagnosis not present

## 2019-05-02 ENCOUNTER — Encounter: Payer: Self-pay | Admitting: Family Medicine

## 2019-05-02 ENCOUNTER — Other Ambulatory Visit: Payer: Self-pay

## 2019-05-02 ENCOUNTER — Ambulatory Visit (INDEPENDENT_AMBULATORY_CARE_PROVIDER_SITE_OTHER): Payer: Medicare Other | Admitting: Family Medicine

## 2019-05-02 VITALS — BP 142/80 | HR 68 | Temp 98.5°F | Resp 14 | Ht 68.0 in | Wt 165.0 lb

## 2019-05-02 DIAGNOSIS — E785 Hyperlipidemia, unspecified: Secondary | ICD-10-CM | POA: Diagnosis not present

## 2019-05-02 DIAGNOSIS — I252 Old myocardial infarction: Secondary | ICD-10-CM | POA: Diagnosis not present

## 2019-05-02 DIAGNOSIS — G253 Myoclonus: Secondary | ICD-10-CM

## 2019-05-02 DIAGNOSIS — I2510 Atherosclerotic heart disease of native coronary artery without angina pectoris: Secondary | ICD-10-CM | POA: Diagnosis not present

## 2019-05-02 DIAGNOSIS — F0390 Unspecified dementia without behavioral disturbance: Secondary | ICD-10-CM | POA: Diagnosis not present

## 2019-05-02 DIAGNOSIS — R7303 Prediabetes: Secondary | ICD-10-CM | POA: Diagnosis not present

## 2019-05-02 DIAGNOSIS — I10 Essential (primary) hypertension: Secondary | ICD-10-CM | POA: Diagnosis not present

## 2019-05-02 NOTE — Progress Notes (Signed)
Subjective:    Patient ID: Jay Rivas, male    DOB: [birth year], 68 y.o.   MRN: [id number]  Medication Refill    09/29/17 Patient is a very pleasant 68 year old Caucasian male with a past medical history of coronary artery disease status post myocardial infarction in the remote past as well as hypertension who presents today with his wife for memory problems.  There seems to have been a sudden change approximately 4 months ago.  Around that time, the patient and the patient's wife noticed word finding difficulties while speaking.  He will frequently forget or be unable to complete his sentences.  He will know what he wants to say but have a difficult time saying the word.  He also reports a difficulty naming objects and forgetting the name of common items.  Around the same time, the patient has noticed generalized memory problems.  He frequently forgets items loses his keys, loses wallet.  He is still able to manage his activities of daily living and independent activities of daily living.  He denies any episodes of forgetting to turn off the stove or forgetting to pay a bill..  However there have been also a other neurologic symptom.  He is experience myoclonic jerks in his right upper extremities.  He is lost the dexterity in his right hand.  He has been diagnosed with carpal tunnel syndrome and is even had surgery for this however he has not regained the strength in his right hand and reports numbness and tingling in all of his fingertips on the right hand along with weakness in the right arm.  On neurologic exam today, cranial nerves II through XII are grossly intact with muscle strength 5/5 equal and symmetric in all extremities except for the right arm.  In the right arm, he has lost the fine motor control in his right hand and a sensation in his right hand.  He also has some mild weakness with elbow flexion and extension.  On cerebellar testing, the patient has a difficult time with finger to nose  testing.  He also has a difficult time with heel to toe gait testing.  Mini-Mental status exam was performed today in the office.  Patient incorrectly states that the year is 2009.  He is able to correctly name the month but not the day of the week nor the date.  He is able to score 5 out of 5 in location.  After 3 chances, the patient is able to recall 3 items.  5 minutes later, he is only able to recall 2 out of the 3 items.  He is unable to perform serial sevens and does not even attempt to perform serial sevens.  He is also unable to spell world in reverse.  On clock drawing, the patient is unable to draw all a clock face with the correct time.  Furthermore he has a noticeable essential tremor with a very misshapen clock face due to the tremor.  He is also unable to look at a clock that I have drawn for him and tell me the correct time.  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  This is a noticeable decline in his baseline mental status.  At that time, my plan was: Patient has noticed a noticeable decline in his mental status over the last 4 months.  Given the word finding difficulties and speech difficulties, I am concerned about possible ischemic stroke in the left hemisphere.  I am also concerned  about possible ischemic stroke or underlying cerebral pathology in the left hemisphere due to the loss of dexterity in his right hand, the weakness in his right arm, along with myoclonic jerks in his right arm.  Therefore I am not comfortable simply calling this dementia/Alzheimer's dementia.  I believe a more thorough diagnostic workup needs to take place to rule out underlying vascular issues in the brain particularly given his previous history of myocardial infarction.  Therefore I will schedule the patient for an MRI of the brain.  I will also check a vitamin B12 level, TSH, CMP, and a CBC.  I will await the results of his lab studies along with MRI prior to determining the next course of  treatment between medication such as Aricept and/or Namenda versus focusing on secondary prevention of future CVA.  10/22/17 Lab work showed a normal TSH. It showed a normal vitamin B12 level. MRI revealed moderate generalized atrophy of the brain but no evidence of stroke, hemorrhage, or brain tumor. Workup to date has been concerning for dementia, most likely Alzheimer's type. They're here today to discuss further.  At that time, my plan was: I have recommended Aricept 5 mg a day and recheck in one month. I suspect that he has early signs of Alzheimer's dementia. Patient's family would like to follow-up with neurology for a second opinion which I will happily arrange. I would recommend increasing the dose of Aricept as tolerated and memory loss worsens, adding Namenda to prevent further deterioration as long as possible. Spent more than 20 minutes with the family today discussing this. I will gladly consult neurology for a second opinion per their request  11/20/17 Patient is currently on Aricept 5 mg a day. Since starting the medication, he has had more vivid dreams. However he denies any insomnia. He denies any hallucination. He denies any nausea or vomiting or diarrhea. He denies any trouble sleeping. He seems to be tolerating the medication well. Obviously it is too soon to detect any change in behavior that he is comfortable taking the medication. They have an appointment scheduled to see a neurologist next week. They're here today to discuss uptitrating Aricept.  AT that time, my plan was: Behavior shows no examples of hallucinations or features concerning for lewy body dementia. MRI shows no evidence of previous strokes to support vascular dementia. Physical exam does not support Parkinson's disease. Therefore I believe the patient has early signs of Alzheimer's disease. Honestly, it is difficult to see any abnormalities just simply talking to the patient. However both the patient and the wife states  that they can tell a change over the last few years particularly this last year. Biggest concerns continue to be word finding difficulties and short term memory problems.  Will add Aricept 10 mg a day and reassess in 6 months.  I will not add Namenda as long as the patient is stable. If the family and patient noticed deterioration, consider adding Namenda at that point  05/21/18  Patient is here today for follow-up.  Subjectively he feels that he is doing better since his last visit.  He has not witnessed any deterioration in his memory raising the question of the diagnosis's accuracy.  However as I question him further, it is obvious that the patient is having some short-term memory problems.  He is also having a difficult time answering questions.  He is here today unaccompanied.  First I asked him how far he completed in school.  He first states fifth  grade.  He then states ninth grade.  Later he states 10th grade.  He seems uncertain of exactly how far he completed school.  He is able to tell me the correct date.  However he takes a fair amount of time to remember the name of the month as though he was having a difficult time thinking of the name.  He also has a difficult time telling me the year.  He stumbles saying 4.Marland Kitchen., 19..., 19... And then corrects himself and says 2019.  He is able to correctly tell me the location quickly.  It takes him 3 attempts to remember the 3 objects I wanted him to recall however after a few minutes he is able to recall all 3.  He is unable to spell world forwards.  Therefore we are unable to try spelling and reverse.  He has an extreme difficult time with serial sevens.  First I asked him to subtract 7 from 100.  He is able to tell me 93.  I then asked him to subtract the same number and he states 90.  When I asked him what number he was subtracting from 93, the patient states 3.  He is unable to remember the job that I signed him to do and is also unable to correctly  subtracted in his head.  AT that time, my plan was: Although the patient is stable and has not witnessed any progression personally, on his exam I feel that there is definitely signs of short-term memory problems.  Therefore I will maintain the patient on Aricept 10 mg a day however at the present time because of the stability I do not see the need to add Namenda yet.  I would reassess the patient in 6 months at a complete physical exam.  We also discussed his direct inguinal hernia and offered the patient a referral to a general surgeon however he politely declines it at the present time.  11/22/18 Patient is here for follow up. He is due for a flu shot, pneumovax 23, prostate cancer screening, fasting labwork, and a colonoscopy.   Patient politely declines a flu shot as well as Pneumovax 23 today.  I encouraged him to reconsider.  He is due for a colonoscopy however he prefers Cologuard.  When he met with his neurologist, Dr. Jannifer Franklin, Dr. 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  Patient has myoclonic jerks in his right hand that are constant.  He often will spill liquids if he tries to drink.  He denies any weakness in his hand however he reports neuropathic pain radiating up and down his right arm as well as constant coldness and numbness in his right arm.  He had an MRI of the cervical spine that revealed no significant nerve impingement on the right.  He was unable to see the neurologist at [facility] and he is requesting a second opinion to another specialist and cortical basilar degeneration perhaps at [facility].  From a memory standpoint, he denies any further progression of his memory loss.  In fact both he and his wife state that his memory may be somewhat better.  He denies any progression of word finding aphasia.  He denies any falls.  He denies any balance issues.  AT that time, my plan was: From a neuromuscular standpoint, the patient does have  myoclonic jerks in his right hand and right forearm.  He also has fine motor degeneration in his right hand.  Past medical history  significant for carpal tunnel syndrome in the right hand status post surgical correction however this seems to be getting worse.  From a standpoint of his memory as well as his word finding difficulties this is actually getting better.  Therefore I would not change or discontinue Aricept but I would arrange a consultation with a neurologist at [facility] to discuss cortical basilar degeneration to see if they have any other treatment options available.  I have recommended a flu shot as well as Pneumovax 23 which he declined.  I will schedule the patient for Cologuard.  I will screen for prostate cancer with a PSA.  I will check a CBC, CMP, and a fasting lipid panel.  His goal LDL cholesterol is less than 70.  05/02/19 I reviewed the patient's lab work from January.  His LDL cholesterol still is greater than 70 which would be his goal given his history of a myocardial infarction.  He takes an aspirin every day but is not on a statin due to problems he had previously with a statin.  His dementia remains stable.  He has not experienced any additional memory loss.  He continues to have apraxia on occasion but overall is doing extremely well.  Therefore I do not believe the patient has Alzheimer's disease.  Instead his decline seems to be more akin to possible vascular dementia.  His MRI that he had previous in the past did show 2 punctate areas of signal intensity in the white matter which could represent small strokes.  He is taking aspirin.  I recommended that he start a statin but he declines a statin today.  He saw the neurologist at [facility].  There was no evidence of Parkinson's disease.  They referred him to a neurosurgeon for possible cervical radiculopathy but the MRI of the cervical spine only revealed arthritis.  He continues to have myoclonic jerks in his right upper extremity worse than  his left upper extremity.   Past Medical History:  Diagnosis Date   Carpal tunnel syndrome on both sides    Dementia ([place])    diagnosed in jan 2019, pt states medication seems to be helping, A&Ox4   HLD (hyperlipidemia)    Hypertension    Memory disorder 11/24/2017   Myocardial infarction [facility])    Dr. Terrence Dupont   Spinal stenosis of cervical region    Past Surgical History:  Procedure Laterality Date   ABDOMINAL SURGERY     CARDIAC CATHETERIZATION  2005   stent   CARPAL TUNNEL RELEASE Right    CARPAL TUNNEL RELEASE Right 03/09/2018   Procedure: RIGHT CARPAL TUNNEL RELEASE;  Surgeon: Daryll Brod, MD;  Location: [place];  Service: Orthopedics;  Laterality: Right;   CARPAL TUNNEL RELEASE Left 11/02/2018   Procedure: LEFT CARPAL TUNNEL RELEASE;  Surgeon: Daryll Brod, MD;  Location: [place];  Service: Orthopedics;  Laterality: Left;   TENDON TRANSFER Left 11/02/2018   Procedure: LEFT CAMITZ TRANSFER;  Surgeon: Daryll Brod, MD;  Location: [place];  Service: Orthopedics;  Laterality: Left;   TRIGGER FINGER RELEASE Right 03/09/2018   Procedure: RELEASE TRIGGER FINGER/A-1 PULLEY RIGHT MIDDLE, RING AND SMALL FINGERS;  Surgeon: Daryll Brod, MD;  Location: [place];  Service: Orthopedics;  Laterality: Right;   ULNAR NERVE TRANSPOSITION Left 11/02/2018   Procedure: LEFT ULNAR NERVE DECOMPRESSION;  Surgeon: Daryll Brod, MD;  Location: [place];  Service: Orthopedics;  Laterality: Left;   ULNAR TUNNEL RELEASE Right 03/09/2018  Procedure: CUBITAL TUNNEL DECOMPRESSION RIGHT ELBOW;  Surgeon: Daryll Brod, MD;  Location: [place];  Service: Orthopedics;  Laterality: Right;   Current Outpatient Medications on File Prior to Visit  Medication Sig Dispense Refill   aspirin EC 81 MG tablet Take 81 mg by mouth daily.     Coconut Oil 1000 MG CAPS Take by mouth.     Coenzyme Q10 (COQ-10) 200 MG  CAPS Take by mouth.     donepezil (ARICEPT) 10 MG tablet Take 1 tablet (10 mg total) by mouth at bedtime. 90 tablet 3   losartan (COZAAR) 50 MG tablet Take 50 mg by mouth daily.     Omega-3 Fatty Acids (FISH OIL) 1000 MG CAPS Take by mouth.     No current facility-administered medications on file prior to visit.    Allergies  Allergen Reactions   Penicillin G Anaphylaxis   Social History   Socioeconomic History   Marital status: Married    Spouse name: Not on file   Number of children: Not on file   Years of education: Not on file   Highest education level: Not on file  Occupational History   Not on file  Social Needs   Financial resource strain: Not on file   Food insecurity    Worry: Not on file    Inability: Not on file   Transportation needs    Medical: Not on file    Non-medical: Not on file  Tobacco Use   Smoking status: Former Smoker   Smokeless tobacco: Former Systems developer   Tobacco comment: quit 1990  Substance and Sexual Activity   Alcohol use: Yes    Alcohol/week: 24.0 standard drinks    Types: 24 Cans of beer per week    Comment: social beer only   Drug use: No   Sexual activity: Yes  Lifestyle   Physical activity    Days per week: Not on file    Minutes per session: Not on file   Stress: Not on file  Relationships   Social connections    Talks on phone: Not on file    Gets together: Not on file    Attends religious service: Not on file    Active member of club or organization: Not on file    Attends meetings of clubs or organizations: Not on file    Relationship status: Not on file   Intimate partner violence    Fear of current or ex partner: Not on file    Emotionally abused: Not on file    Physically abused: Not on file    Forced sexual activity: Not on file  Other Topics Concern   Not on file  Social History Narrative   Lives with wife   Right handed    No family history on file.     Review of Systems  All other  systems reviewed and are negative.      Objective:   Physical Exam  Constitutional: He is oriented to person, place, and time. He appears well-developed and well-nourished. No distress.  HENT:  Head: Normocephalic and atraumatic.  Right Ear: External ear normal.  Left Ear: External ear normal.  Nose: Nose normal.  Mouth/Throat: Oropharynx is clear and moist. No oropharyngeal exudate.  Eyes: Pupils are equal, round, and reactive to light. Conjunctivae and EOM are normal. Right eye exhibits no discharge. Left eye exhibits no discharge. No scleral icterus.  Neck: Normal range of motion. Neck supple. No JVD present. No thyromegaly  present.  Cardiovascular: Normal rate, regular rhythm, normal heart sounds and intact distal pulses. Exam reveals no gallop and no friction rub.  No murmur heard. Pulmonary/Chest: Effort normal and breath sounds normal. No respiratory distress. He has no wheezes. He has no rales. He exhibits no tenderness.  Abdominal: Soft. Bowel sounds are normal. He exhibits no distension and no mass. There is no abdominal tenderness. There is no rebound and no guarding.  Lymphadenopathy:    He has no cervical adenopathy.  Neurological: He is alert and oriented to person, place, and time. He has normal reflexes. No cranial nerve deficit.  Skin: He is not diaphoretic.  Psychiatric: He has a normal mood and affect. His behavior is normal. Judgment and thought content normal.  Vitals reviewed.         Assessment & Plan:  The primary encounter diagnosis was Dementia without behavioral disturbance, unspecified dementia type ([place]). Diagnoses of History of myocardial infarction and Myoclonus were also pertinent to this visit. 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  There has been no further regression although he does continue to have apraxia.  However he continues to have  significant myoclonus in his right upper extremity.  We discussed empirically trying Keppra 500 mg twice daily for myoclonic jerks and then increasing as tolerated up to a maximum of 3000 mg.  We discussed the risk of sedation and dizziness and he elects against taking that medication.  I recommended he take an aspirin for secondary prevention of strokes as well as coronary artery disease however also recommended trying a statin for possible microvascular ischemic strokes and secondary prevention.  Patient declines a statin

## 2019-05-06 DIAGNOSIS — E785 Hyperlipidemia, unspecified: Secondary | ICD-10-CM | POA: Diagnosis not present

## 2019-05-06 DIAGNOSIS — F0390 Unspecified dementia without behavioral disturbance: Secondary | ICD-10-CM | POA: Diagnosis not present

## 2019-05-06 DIAGNOSIS — I2510 Atherosclerotic heart disease of native coronary artery without angina pectoris: Secondary | ICD-10-CM | POA: Diagnosis not present

## 2019-05-06 DIAGNOSIS — R7303 Prediabetes: Secondary | ICD-10-CM | POA: Diagnosis not present

## 2019-05-06 DIAGNOSIS — I252 Old myocardial infarction: Secondary | ICD-10-CM | POA: Diagnosis not present

## 2019-05-06 DIAGNOSIS — I10 Essential (primary) hypertension: Secondary | ICD-10-CM | POA: Diagnosis not present

## 2019-05-26 ENCOUNTER — Other Ambulatory Visit: Payer: Self-pay

## 2019-05-30 DIAGNOSIS — D225 Melanocytic nevi of trunk: Secondary | ICD-10-CM | POA: Diagnosis not present

## 2019-05-30 DIAGNOSIS — D485 Neoplasm of uncertain behavior of skin: Secondary | ICD-10-CM | POA: Diagnosis not present

## 2019-05-30 DIAGNOSIS — D2262 Melanocytic nevi of left upper limb, including shoulder: Secondary | ICD-10-CM | POA: Diagnosis not present

## 2019-05-30 DIAGNOSIS — D2261 Melanocytic nevi of right upper limb, including shoulder: Secondary | ICD-10-CM | POA: Diagnosis not present

## 2019-05-30 DIAGNOSIS — D2272 Melanocytic nevi of left lower limb, including hip: Secondary | ICD-10-CM | POA: Diagnosis not present

## 2019-05-30 DIAGNOSIS — D2271 Melanocytic nevi of right lower limb, including hip: Secondary | ICD-10-CM | POA: Diagnosis not present

## 2019-05-30 DIAGNOSIS — L570 Actinic keratosis: Secondary | ICD-10-CM | POA: Diagnosis not present

## 2019-05-30 DIAGNOSIS — L821 Other seborrheic keratosis: Secondary | ICD-10-CM | POA: Diagnosis not present

## 2019-06-07 DIAGNOSIS — H903 Sensorineural hearing loss, bilateral: Secondary | ICD-10-CM | POA: Diagnosis not present

## 2019-06-07 DIAGNOSIS — H9313 Tinnitus, bilateral: Secondary | ICD-10-CM | POA: Diagnosis not present

## 2019-06-20 DIAGNOSIS — X32XXXA Exposure to sunlight, initial encounter: Secondary | ICD-10-CM | POA: Diagnosis not present

## 2019-06-20 DIAGNOSIS — L570 Actinic keratosis: Secondary | ICD-10-CM | POA: Diagnosis not present

## 2019-08-12 DIAGNOSIS — E785 Hyperlipidemia, unspecified: Secondary | ICD-10-CM | POA: Diagnosis not present

## 2019-08-12 DIAGNOSIS — F0390 Unspecified dementia without behavioral disturbance: Secondary | ICD-10-CM | POA: Diagnosis not present

## 2019-08-12 DIAGNOSIS — R7303 Prediabetes: Secondary | ICD-10-CM | POA: Diagnosis not present

## 2019-08-12 DIAGNOSIS — I2510 Atherosclerotic heart disease of native coronary artery without angina pectoris: Secondary | ICD-10-CM | POA: Diagnosis not present

## 2019-08-12 DIAGNOSIS — I10 Essential (primary) hypertension: Secondary | ICD-10-CM | POA: Diagnosis not present

## 2019-08-18 ENCOUNTER — Other Ambulatory Visit: Payer: Self-pay | Admitting: Neurology

## 2019-08-22 ENCOUNTER — Telehealth: Payer: Self-pay | Admitting: Family Medicine

## 2019-08-22 NOTE — Telephone Encounter (Signed)
Wife stopped by with patient requesting a letter for jury duty. She states that he has dementia.  CB# [phone]

## 2019-08-23 ENCOUNTER — Encounter: Payer: Self-pay | Admitting: Family Medicine

## 2019-08-23 NOTE — Telephone Encounter (Signed)
Completed and signed by MD. LMOVM that letter was ready to be picked up and letter was placed up front.

## 2019-08-23 NOTE — Telephone Encounter (Signed)
Jury duty letter in chart

## 2019-09-26 DIAGNOSIS — M9902 Segmental and somatic dysfunction of thoracic region: Secondary | ICD-10-CM | POA: Diagnosis not present

## 2019-09-26 DIAGNOSIS — M4722 Other spondylosis with radiculopathy, cervical region: Secondary | ICD-10-CM | POA: Diagnosis not present

## 2019-09-26 DIAGNOSIS — M50322 Other cervical disc degeneration at C5-C6 level: Secondary | ICD-10-CM | POA: Diagnosis not present

## 2019-09-26 DIAGNOSIS — M50321 Other cervical disc degeneration at C4-C5 level: Secondary | ICD-10-CM | POA: Diagnosis not present

## 2019-09-26 DIAGNOSIS — M50323 Other cervical disc degeneration at C6-C7 level: Secondary | ICD-10-CM | POA: Diagnosis not present

## 2019-09-26 DIAGNOSIS — M9901 Segmental and somatic dysfunction of cervical region: Secondary | ICD-10-CM | POA: Diagnosis not present

## 2019-09-26 DIAGNOSIS — M4802 Spinal stenosis, cervical region: Secondary | ICD-10-CM | POA: Diagnosis not present

## 2019-09-26 DIAGNOSIS — M9904 Segmental and somatic dysfunction of sacral region: Secondary | ICD-10-CM | POA: Diagnosis not present

## 2019-11-04 ENCOUNTER — Encounter: Payer: Self-pay | Admitting: Family Medicine

## 2019-11-04 ENCOUNTER — Other Ambulatory Visit: Payer: Self-pay

## 2019-11-04 ENCOUNTER — Ambulatory Visit (INDEPENDENT_AMBULATORY_CARE_PROVIDER_SITE_OTHER): Payer: Medicare Other | Admitting: Family Medicine

## 2019-11-04 VITALS — BP 142/84 | HR 70 | Temp 97.3°F | Resp 16 | Ht 68.0 in | Wt 166.0 lb

## 2019-11-04 DIAGNOSIS — Z13220 Encounter for screening for lipoid disorders: Secondary | ICD-10-CM | POA: Diagnosis not present

## 2019-11-04 DIAGNOSIS — I10 Essential (primary) hypertension: Secondary | ICD-10-CM

## 2019-11-04 DIAGNOSIS — F0390 Unspecified dementia without behavioral disturbance: Secondary | ICD-10-CM

## 2019-11-04 DIAGNOSIS — I252 Old myocardial infarction: Secondary | ICD-10-CM

## 2019-11-04 DIAGNOSIS — Z136 Encounter for screening for cardiovascular disorders: Secondary | ICD-10-CM | POA: Diagnosis not present

## 2019-11-04 MED ORDER — ATORVASTATIN CALCIUM 10 MG PO TABS: 10.0000 mg | ORAL_TABLET | Freq: Every day | ORAL | 3 refills | Status: DC

## 2019-11-04 NOTE — Progress Notes (Signed)
Subjective:    Patient ID: Jay Rivas, male    DOB: 05-02-1951, 69 y.o.   MRN: [id number]  Medication Refill    09/29/17 Patient is a very pleasant 69 year old Caucasian male with a past medical history of coronary artery disease status post myocardial infarction in the remote past as well as hypertension who presents today with his wife for memory problems.  There seems to have been a sudden change approximately 4 months ago.  Around that time, the patient and the patient's wife noticed word finding difficulties while speaking.  He will frequently forget or be unable to complete his sentences.  He will know what he wants to say but have a difficult time saying the word.  He also reports a difficulty naming objects and forgetting the name of common items.  Around the same time, the patient has noticed generalized memory problems.  He frequently forgets items loses his keys, loses wallet.  He is still able to manage his activities of daily living and independent activities of daily living.  He denies any episodes of forgetting to turn off the stove or forgetting to pay a bill..  However there have been also a other neurologic symptom.  He is experience myoclonic jerks in his right upper extremities.  He is lost the dexterity in his right hand.  He has been diagnosed with carpal tunnel syndrome and is even had surgery for this however he has not regained the strength in his right hand and reports numbness and tingling in all of his fingertips on the right hand along with weakness in the right arm.  On neurologic exam today, cranial nerves II through XII are grossly intact with muscle strength 5/5 equal and symmetric in all extremities except for the right arm.  In the right arm, he has lost the fine motor control in his right hand and a sensation in his right hand.  He also has some mild weakness with elbow flexion and extension.  On cerebellar testing, the patient has a difficult time with finger to nose  testing.  He also has a difficult time with heel to toe gait testing.  Mini-Mental status exam was performed today in the office.  Patient incorrectly states that the year is 2009.  He is able to correctly name the month but not the day of the week nor the date.  He is able to score 5 out of 5 in location.  After 3 chances, the patient is able to recall 3 items.  5 minutes later, he is only able to recall 2 out of the 3 items.  He is unable to perform serial sevens and does not even attempt to perform serial sevens.  He is also unable to spell world in reverse.  On clock drawing, the patient is unable to draw all a clock face with the correct time.  Furthermore he has a noticeable essential tremor with a very misshapen clock face due to the tremor.  He is also unable to look at a clock that I have drawn for him and tell me the correct time.  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  This is a noticeable decline in his baseline mental status.  At that time, my plan was: Patient has noticed a noticeable decline in his mental status over the last 4 months.  Given the word finding difficulties and speech difficulties, I am concerned about possible ischemic stroke in the left hemisphere.  I am also concerned  about possible ischemic stroke or underlying cerebral pathology in the left hemisphere due to the loss of dexterity in his right hand, the weakness in his right arm, along with myoclonic jerks in his right arm.  Therefore I am not comfortable simply calling this dementia/Alzheimer's dementia.  I believe a more thorough diagnostic workup needs to take place to rule out underlying vascular issues in the brain particularly given his previous history of myocardial infarction.  Therefore I will schedule the patient for an MRI of the brain.  I will also check a vitamin B12 level, TSH, CMP, and a CBC.  I will await the results of his lab studies along with MRI prior to determining the next course of  treatment between medication such as Aricept and/or Namenda versus focusing on secondary prevention of future CVA.  10/22/17 Lab work showed a normal TSH. It showed a normal vitamin B12 level. MRI revealed moderate generalized atrophy of the brain but no evidence of stroke, hemorrhage, or brain tumor. Workup to date has been concerning for dementia, most likely Alzheimer's type. They're here today to discuss further.  At that time, my plan was: I have recommended Aricept 5 mg a day and recheck in one month. I suspect that he has early signs of Alzheimer's dementia. Patient's family would like to follow-up with neurology for a second opinion which I will happily arrange. I would recommend increasing the dose of Aricept as tolerated and memory loss worsens, adding Namenda to prevent further deterioration as long as possible. Spent more than 20 minutes with the family today discussing this. I will gladly consult neurology for a second opinion per their request  11/20/17 Patient is currently on Aricept 5 mg a day. Since starting the medication, he has had more vivid dreams. However he denies any insomnia. He denies any hallucination. He denies any nausea or vomiting or diarrhea. He denies any trouble sleeping. He seems to be tolerating the medication well. Obviously it is too soon to detect any change in behavior that he is comfortable taking the medication. They have an appointment scheduled to see a neurologist next week. They're here today to discuss uptitrating Aricept.  AT that time, my plan was: Behavior shows no examples of hallucinations or features concerning for lewy body dementia. MRI shows no evidence of previous strokes to support vascular dementia. Physical exam does not support Parkinson's disease. Therefore I believe the patient has early signs of Alzheimer's disease. Honestly, it is difficult to see any abnormalities just simply talking to the patient. However both the patient and the wife states  that they can tell a change over the last few years particularly this last year. Biggest concerns continue to be word finding difficulties and short term memory problems.  Will add Aricept 10 mg a day and reassess in 6 months.  I will not add Namenda as long as the patient is stable. If the family and patient noticed deterioration, consider adding Namenda at that point  05/21/18  Patient is here today for follow-up.  Subjectively he feels that he is doing better since his last visit.  He has not witnessed any deterioration in his memory raising the question of the diagnosis's accuracy.  However as I question him further, it is obvious that the patient is having some short-term memory problems.  He is also having a difficult time answering questions.  He is here today unaccompanied.  First I asked him how far he completed in school.  He first states fifth  grade.  He then states ninth grade.  Later he states 10th grade.  He seems uncertain of exactly how far he completed school.  He is able to tell me the correct date.  However he takes a fair amount of time to remember the name of the month as though he was having a difficult time thinking of the name.  He also has a difficult time telling me the year.  He stumbles saying 54.Marland Kitchen., 19..., 19... And then corrects himself and says 2019.  He is able to correctly tell me the location quickly.  It takes him 3 attempts to remember the 3 objects I wanted him to recall however after a few minutes he is able to recall all 3.  He is unable to spell world forwards.  Therefore we are unable to try spelling and reverse.  He has an extreme difficult time with serial sevens.  First I asked him to subtract 7 from 100.  He is able to tell me 93.  I then asked him to subtract the same number and he states 90.  When I asked him what number he was subtracting from 93, the patient states 3.  He is unable to remember the job that I signed him to do and is also unable to correctly  subtracted in his head.  AT that time, my plan was: Although the patient is stable and has not witnessed any progression personally, on his exam I feel that there is definitely signs of short-term memory problems.  Therefore I will maintain the patient on Aricept 10 mg a day however at the present time because of the stability I do not see the need to add Namenda yet.  I would reassess the patient in 6 months at a complete physical exam.  We also discussed his direct inguinal hernia and offered the patient a referral to a general surgeon however he politely declines it at the present time.  11/22/18 Patient is here for follow up. He is due for a flu shot, pneumovax 23, prostate cancer screening, fasting labwork, and a colonoscopy.   Patient politely declines a flu shot as well as Pneumovax 23 today.  I encouraged him to reconsider.  He is due for a colonoscopy however he prefers Cologuard.  When he met with his neurologist, Dr. Jannifer Franklin, Dr. 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  Patient has myoclonic jerks in his right hand that are constant.  He often will spill liquids if he tries to drink.  He denies any weakness in his hand however he reports neuropathic pain radiating up and down his right arm as well as constant coldness and numbness in his right arm.  He had an MRI of the cervical spine that revealed no significant nerve impingement on the right.  He was unable to see the neurologist at [facility] and he is requesting a second opinion to another specialist and cortical basilar degeneration perhaps at [facility].  From a memory standpoint, he denies any further progression of his memory loss.  In fact both he and his wife state that his memory may be somewhat better.  He denies any progression of word finding aphasia.  He denies any falls.  He denies any balance issues.  AT that time, my plan was: From a neuromuscular standpoint, the patient does have  myoclonic jerks in his right hand and right forearm.  He also has fine motor degeneration in his right hand.  Past medical history  significant for carpal tunnel syndrome in the right hand status post surgical correction however this seems to be getting worse.  From a standpoint of his memory as well as his word finding difficulties this is actually getting better.  Therefore I would not change or discontinue Aricept but I would arrange a consultation with a neurologist at [facility] to discuss cortical basilar degeneration to see if they have any other treatment options available.  I have recommended a flu shot as well as Pneumovax 23 which he declined.  I will schedule the patient for Cologuard.  I will screen for prostate cancer with a PSA.  I will check a CBC, CMP, and a fasting lipid panel.  His goal LDL cholesterol is less than 70.  05/02/19 I reviewed the patient's lab work from January.  His LDL cholesterol still is greater than 70 which would be his goal given his history of a myocardial infarction.  He takes an aspirin every day but is not on a statin due to problems he had previously with a statin.  His dementia remains stable.  He has not experienced any additional memory loss.  He continues to have apraxia on occasion but overall is doing extremely well.  Therefore I do not believe the patient has Alzheimer's disease.  Instead his decline seems to be more akin to possible vascular dementia.  His MRI that he had previous in the past did show 2 punctate areas of signal intensity in the white matter which could represent small strokes.  He is taking aspirin.  I recommended that he start a statin but he declines a statin today.  He saw the neurologist at [facility].  There was no evidence of Parkinson's disease.  They referred him to a neurosurgeon for possible cervical radiculopathy but the MRI of the cervical spine only revealed arthritis.  He continues to have myoclonic jerks in his right upper extremity worse than  his left upper extremity.  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  There has been no further regression although he does continue to have apraxia.  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  We discussed empirically trying Keppra 500 mg twice daily for myoclonic jerks and then increasing as tolerated up to a maximum of 3000 mg.  We discussed the risk of sedation and dizziness and he elects against taking that medication.  I recommended he take an aspirin for secondary prevention of strokes as well as coronary artery disease however also recommended trying a statin for possible microvascular ischemic strokes and secondary prevention.  Patient declines a statin  11/04/19 Patient is here today with his wife for checkup.  He states that the myoclonic jerking has improved.  He saw a holistic doctor who seem to really help him with that and for that I am grateful.  Overall he is doing well.  He continues to have apraxia.  His biggest problem is word finding difficulties.  Both he and his wife deny any progression of short-term memory loss.  He does not forget conversations.  He is not getting lost driving.  He is not forgetting how to do or operate machinery or how to do things he is always done.  He is not losing money or leaving on electrical appliances.  Overall he is doing very well aside from the  apraxia which appears stable.  He denies any chest pain shortness of breath or dyspnea on exertion.  He is still not on a statin despite having 2 small punctate strokes seen on his MRI and a history of coronary artery disease.  We spent 15 minutes discussing this including the potential to help prevent future strokes and heart attacks and the patient is willing to try low-dose of this in the future.  He is already on an aspirin.   Past Medical History:    Diagnosis Date  . Carpal tunnel syndrome on both sides   . Dementia ([place])    diagnosed in jan 2019, pt states medication seems to be helping, A&Ox4  . HLD (hyperlipidemia)   . Hypertension   . Memory disorder 11/24/2017  . Myocardial infarction [facility])    Dr. Terrence Dupont  . Spinal stenosis of cervical region    Past Surgical History:  Procedure Laterality Date  . ABDOMINAL SURGERY    . CARDIAC CATHETERIZATION  2005   stent  . CARPAL TUNNEL RELEASE Right   . CARPAL TUNNEL RELEASE Right 03/09/2018   Procedure: RIGHT CARPAL TUNNEL RELEASE;  Surgeon: Daryll Brod, MD;  Location: [place];  Service: Orthopedics;  Laterality: Right;  . CARPAL TUNNEL RELEASE Left 11/02/2018   Procedure: LEFT CARPAL TUNNEL RELEASE;  Surgeon: Daryll Brod, MD;  Location: [place];  Service: Orthopedics;  Laterality: Left;  . TENDON TRANSFER Left 11/02/2018   Procedure: LEFT CAMITZ TRANSFER;  Surgeon: Daryll Brod, MD;  Location: [place];  Service: Orthopedics;  Laterality: Left;  . TRIGGER FINGER RELEASE Right 03/09/2018   Procedure: RELEASE TRIGGER FINGER/A-1 PULLEY RIGHT MIDDLE, RING AND SMALL FINGERS;  Surgeon: Daryll Brod, MD;  Location: [place];  Service: Orthopedics;  Laterality: Right;  . ULNAR NERVE TRANSPOSITION Left 11/02/2018   Procedure: LEFT ULNAR NERVE DECOMPRESSION;  Surgeon: Daryll Brod, MD;  Location: [place];  Service: Orthopedics;  Laterality: Left;  . ULNAR TUNNEL RELEASE Right 03/09/2018   Procedure: CUBITAL TUNNEL DECOMPRESSION RIGHT ELBOW;  Surgeon: Daryll Brod, MD;  Location: [place];  Service: Orthopedics;  Laterality: Right;   Current Outpatient Medications on File Prior to Visit  Medication Sig Dispense Refill  . aspirin EC 81 MG tablet Take 81 mg by mouth daily.    . Coconut Oil 1000 MG CAPS Take by mouth.    . Coenzyme Q10 (COQ-10) 200 MG CAPS Take by mouth.    . donepezil (ARICEPT) 10 MG  tablet TAKE 1 TABLET BY MOUTH EVERYDAY AT BEDTIME 90 tablet 0  . losartan (COZAAR) 50 MG tablet Take 50 mg by mouth daily.    . Omega-3 Fatty Acids (FISH OIL) 1000 MG CAPS Take by mouth.     No current facility-administered medications on file prior to visit.   Allergies  Allergen Reactions  . Penicillin G Anaphylaxis   Social History   Socioeconomic History  . Marital status: Married    Spouse name: Not on file  . Number of children: Not on file  . Years of education: Not on file  . Highest education level: Not on file  Occupational History  . Not on file  Tobacco Use  . Smoking status: Former Research scientist (life sciences)  . Smokeless tobacco: Former Systems developer  . Tobacco comment: quit 1990  Substance and Sexual Activity  . Alcohol use: Yes    Alcohol/week: 24.0 standard drinks    Types: 24 Cans of beer per week  Comment: social beer only  . Drug use: No  . Sexual activity: Yes  Other Topics Concern  . Not on file  Social History Narrative   Lives with wife   Right handed    Social Determinants of Health   Financial Resource Strain:   . Difficulty of Paying Living Expenses: Not on file  Food Insecurity:   . Worried About Charity fundraiser in the Last Year: Not on file  . Ran Out of Food in the Last Year: Not on file  Transportation Needs:   . Lack of Transportation (Medical): Not on file  . Lack of Transportation (Non-Medical): Not on file  Physical Activity:   . Days of Exercise per Week: Not on file  . Minutes of Exercise per Session: Not on file  Stress:   . Feeling of Stress : Not on file  Social Connections:   . Frequency of Communication with Friends and Family: Not on file  . Frequency of Social Gatherings with Friends and Family: Not on file  . Attends Religious Services: Not on file  . Active Member of Clubs or Organizations: Not on file  . Attends Archivist Meetings: Not on file  . Marital Status: Not on file  Intimate Partner Violence:   . Fear of Current or  Ex-Partner: Not on file  . Emotionally Abused: Not on file  . Physically Abused: Not on file  . Sexually Abused: Not on file   No family history on file.     Review of Systems  All other systems reviewed and are negative.      Objective:   Physical Exam  Constitutional: He is oriented to person, place, and time. He appears well-developed and well-nourished. No distress.  HENT:  Head: Normocephalic and atraumatic.  Right Ear: External ear normal.  Left Ear: External ear normal.  Nose: Nose normal.  Mouth/Throat: Oropharynx is clear and moist. No oropharyngeal exudate.  Eyes: Pupils are equal, round, and reactive to light. Conjunctivae and EOM are normal. Right eye exhibits no discharge. Left eye exhibits no discharge. No scleral icterus.  Neck: No JVD present. No thyromegaly present.  Cardiovascular: Normal rate, regular rhythm, normal heart sounds and intact distal pulses. Exam reveals no gallop and no friction rub.  No murmur heard. Pulmonary/Chest: Effort normal and breath sounds normal. No respiratory distress. He has no wheezes. He has no rales. He exhibits no tenderness.  Abdominal: Soft. Bowel sounds are normal. He exhibits no distension and no mass. There is no abdominal tenderness. There is no rebound and no guarding.  Musculoskeletal:     Cervical back: Normal range of motion and neck supple.  Lymphadenopathy:    He has no cervical adenopathy.  Neurological: He is alert and oriented to person, place, and time. He has normal reflexes. No cranial nerve deficit.  Skin: He is not diaphoretic.  Psychiatric: He has a normal mood and affect. His behavior is normal. Judgment and thought content normal.  Vitals reviewed.         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  Patient seems stable over the last 6 months with no progression.  Therefore, as I explained to the patient and his wife, I feel we need to optimize his management of  his underlying medical problems to prevent future CVA.  I would strongly recommend a statin.  Patient is willing to take 10 mg a day of Lipitor.  I will check a CBC, CMP, and fasting lipid panel to obtain a baseline.  Patient declines a flu shot today.

## 2019-11-05 LAB — COMPLETE METABOLIC PANEL WITH GFR
AG Ratio: 2 (calc) (ref 1.0–2.5)
ALT: 15 U/L (ref 9–46)
AST: 18 U/L (ref 10–35)
Albumin: 4.5 g/dL (ref 3.6–5.1)
Alkaline phosphatase (APISO): 58 U/L (ref 35–144)
BUN: 12 mg/dL (ref 7–25)
CO2: 29 mmol/L (ref 20–32)
Calcium: 9.7 mg/dL (ref 8.6–10.3)
Chloride: 103 mmol/L (ref 98–110)
Creat: 0.92 mg/dL (ref 0.70–1.25)
GFR, Est African American: 99 mL/min/{1.73_m2} (ref >=60)
GFR, Est Non African American: 86 mL/min/{1.73_m2} (ref >=60)
Globulin: 2.3 g/dL (calc) (ref 1.9–3.7)
Glucose, Bld: 107 mg/dL — ABNORMAL HIGH (ref 65–99)
Potassium: 4.4 mmol/L (ref 3.5–5.3)
Sodium: 142 mmol/L (ref 135–146)
Total Bilirubin: 0.9 mg/dL (ref 0.2–1.2)
Total Protein: 6.8 g/dL (ref 6.1–8.1)

## 2019-11-05 LAB — LIPID PANEL
Cholesterol: 188 mg/dL (ref <200)
HDL: 60 mg/dL (ref >=40)
LDL Cholesterol (Calc): 105 mg/dL (calc) — ABNORMAL HIGH
Non-HDL Cholesterol (Calc): 128 mg/dL (calc) (ref <130)
Total CHOL/HDL Ratio: 3.1 (calc) (ref <5.0)
Triglycerides: 130 mg/dL (ref <150)

## 2019-11-05 LAB — CBC WITH DIFFERENTIAL/PLATELET
Absolute Monocytes: 361 cells/uL (ref 200–950)
Basophils Absolute: 48 cells/uL (ref 0–200)
Basophils Relative: 1.1 %
Eosinophils Absolute: 321 cells/uL (ref 15–500)
Eosinophils Relative: 7.3 %
HCT: 39.7 % (ref 38.5–50.0)
Hemoglobin: 13.8 g/dL (ref 13.2–17.1)
Lymphs Abs: 884 cells/uL (ref 850–3900)
MCH: 32.2 pg (ref 27.0–33.0)
MCHC: 34.8 g/dL (ref 32.0–36.0)
MCV: 92.8 fL (ref 80.0–100.0)
MPV: 9.3 fL (ref 7.5–12.5)
Monocytes Relative: 8.2 %
Neutro Abs: 2785 cells/uL (ref 1500–7800)
Neutrophils Relative %: 63.3 %
Platelets: 199 10*3/uL (ref 140–400)
RBC: 4.28 10*6/uL (ref 4.20–5.80)
RDW: 12.6 % (ref 11.0–15.0)
Total Lymphocyte: 20.1 %
WBC: 4.4 10*3/uL (ref 3.8–10.8)

## 2019-11-11 DIAGNOSIS — R7303 Prediabetes: Secondary | ICD-10-CM | POA: Diagnosis not present

## 2019-11-11 DIAGNOSIS — F0390 Unspecified dementia without behavioral disturbance: Secondary | ICD-10-CM | POA: Diagnosis not present

## 2019-11-11 DIAGNOSIS — I2510 Atherosclerotic heart disease of native coronary artery without angina pectoris: Secondary | ICD-10-CM | POA: Diagnosis not present

## 2019-11-11 DIAGNOSIS — I10 Essential (primary) hypertension: Secondary | ICD-10-CM | POA: Diagnosis not present

## 2019-11-11 DIAGNOSIS — E785 Hyperlipidemia, unspecified: Secondary | ICD-10-CM | POA: Diagnosis not present

## 2019-11-14 ENCOUNTER — Encounter: Payer: Self-pay | Admitting: Family Medicine

## 2019-11-17 ENCOUNTER — Other Ambulatory Visit: Payer: Self-pay | Admitting: Neurology

## 2019-11-17 NOTE — Telephone Encounter (Signed)
Refused aricept refill, last seen 04/2018, canceled last FU, stated getting second opinion.

## 2019-11-28 DIAGNOSIS — M9901 Segmental and somatic dysfunction of cervical region: Secondary | ICD-10-CM | POA: Diagnosis not present

## 2019-11-28 DIAGNOSIS — M4722 Other spondylosis with radiculopathy, cervical region: Secondary | ICD-10-CM | POA: Diagnosis not present

## 2019-11-28 DIAGNOSIS — M9904 Segmental and somatic dysfunction of sacral region: Secondary | ICD-10-CM | POA: Diagnosis not present

## 2019-11-28 DIAGNOSIS — M4802 Spinal stenosis, cervical region: Secondary | ICD-10-CM | POA: Diagnosis not present

## 2019-11-28 DIAGNOSIS — M50322 Other cervical disc degeneration at C5-C6 level: Secondary | ICD-10-CM | POA: Diagnosis not present

## 2019-11-28 DIAGNOSIS — M50321 Other cervical disc degeneration at C4-C5 level: Secondary | ICD-10-CM | POA: Diagnosis not present

## 2019-11-28 DIAGNOSIS — M50323 Other cervical disc degeneration at C6-C7 level: Secondary | ICD-10-CM | POA: Diagnosis not present

## 2019-11-28 DIAGNOSIS — M9902 Segmental and somatic dysfunction of thoracic region: Secondary | ICD-10-CM | POA: Diagnosis not present

## 2019-12-26 DIAGNOSIS — D2271 Melanocytic nevi of right lower limb, including hip: Secondary | ICD-10-CM | POA: Diagnosis not present

## 2019-12-26 DIAGNOSIS — D2262 Melanocytic nevi of left upper limb, including shoulder: Secondary | ICD-10-CM | POA: Diagnosis not present

## 2019-12-26 DIAGNOSIS — D2272 Melanocytic nevi of left lower limb, including hip: Secondary | ICD-10-CM | POA: Diagnosis not present

## 2019-12-26 DIAGNOSIS — D225 Melanocytic nevi of trunk: Secondary | ICD-10-CM | POA: Diagnosis not present

## 2019-12-26 DIAGNOSIS — D2261 Melanocytic nevi of right upper limb, including shoulder: Secondary | ICD-10-CM | POA: Diagnosis not present

## 2019-12-26 DIAGNOSIS — L821 Other seborrheic keratosis: Secondary | ICD-10-CM | POA: Diagnosis not present

## 2019-12-26 DIAGNOSIS — L570 Actinic keratosis: Secondary | ICD-10-CM | POA: Diagnosis not present

## 2019-12-26 DIAGNOSIS — X32XXXA Exposure to sunlight, initial encounter: Secondary | ICD-10-CM | POA: Diagnosis not present

## 2020-01-23 DIAGNOSIS — M4802 Spinal stenosis, cervical region: Secondary | ICD-10-CM | POA: Diagnosis not present

## 2020-01-23 DIAGNOSIS — M9901 Segmental and somatic dysfunction of cervical region: Secondary | ICD-10-CM | POA: Diagnosis not present

## 2020-01-23 DIAGNOSIS — M4722 Other spondylosis with radiculopathy, cervical region: Secondary | ICD-10-CM | POA: Diagnosis not present

## 2020-01-23 DIAGNOSIS — M50323 Other cervical disc degeneration at C6-C7 level: Secondary | ICD-10-CM | POA: Diagnosis not present

## 2020-01-23 DIAGNOSIS — M50322 Other cervical disc degeneration at C5-C6 level: Secondary | ICD-10-CM | POA: Diagnosis not present

## 2020-01-23 DIAGNOSIS — M9904 Segmental and somatic dysfunction of sacral region: Secondary | ICD-10-CM | POA: Diagnosis not present

## 2020-01-23 DIAGNOSIS — M50321 Other cervical disc degeneration at C4-C5 level: Secondary | ICD-10-CM | POA: Diagnosis not present

## 2020-01-23 DIAGNOSIS — M9902 Segmental and somatic dysfunction of thoracic region: Secondary | ICD-10-CM | POA: Diagnosis not present

## 2020-02-10 ENCOUNTER — Other Ambulatory Visit: Payer: Self-pay

## 2020-02-10 ENCOUNTER — Ambulatory Visit (INDEPENDENT_AMBULATORY_CARE_PROVIDER_SITE_OTHER): Payer: Medicare Other | Admitting: Family Medicine

## 2020-02-10 VITALS — BP 144/78 | HR 78 | Temp 96.9°F | Resp 16 | Ht 68.0 in | Wt 160.0 lb

## 2020-02-10 DIAGNOSIS — I10 Essential (primary) hypertension: Secondary | ICD-10-CM | POA: Diagnosis not present

## 2020-02-10 DIAGNOSIS — F0390 Unspecified dementia without behavioral disturbance: Secondary | ICD-10-CM | POA: Diagnosis not present

## 2020-02-10 DIAGNOSIS — E785 Hyperlipidemia, unspecified: Secondary | ICD-10-CM | POA: Diagnosis not present

## 2020-02-10 DIAGNOSIS — G629 Polyneuropathy, unspecified: Secondary | ICD-10-CM | POA: Diagnosis not present

## 2020-02-10 DIAGNOSIS — R7303 Prediabetes: Secondary | ICD-10-CM | POA: Diagnosis not present

## 2020-02-10 DIAGNOSIS — I2510 Atherosclerotic heart disease of native coronary artery without angina pectoris: Secondary | ICD-10-CM | POA: Diagnosis not present

## 2020-02-10 NOTE — Progress Notes (Signed)
Subjective:    Patient ID: Jay Rivas, male    DOB: 11-Dec-1950, 69 y.o.   MRN: [id number]   05/02/19 I reviewed the patient's lab work from January.  His LDL cholesterol still is greater than 70 which would be his goal given his history of a myocardial infarction.  He takes an aspirin every day but is not on a statin due to problems he had previously with a statin.  His dementia remains stable.  He has not experienced any additional memory loss.  He continues to have apraxia on occasion but overall is doing extremely well.  Therefore I do not believe the patient has Alzheimer's disease.  Instead his decline seems to be more akin to possible vascular dementia.  His MRI that he had previous in the past did show 2 punctate areas of signal intensity in the white matter which could represent small strokes.  He is taking aspirin.  I recommended that he start a statin but he declines a statin today.  He saw the neurologist at [facility].  There was no evidence of Parkinson's disease.  They referred him to a neurosurgeon for possible cervical radiculopathy but the MRI of the cervical spine only revealed arthritis.  He continues to have myoclonic jerks in his right upper extremity worse than his left upper extremity.  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  There has been no further regression although he does continue to have apraxia.  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  We discussed empirically trying Keppra 500 mg twice daily for myoclonic jerks and then increasing as tolerated up to a maximum of 3000 mg.  We discussed the risk of sedation and dizziness and he elects against taking that medication.  I recommended he take an aspirin for secondary prevention of strokes as well as coronary artery disease however also recommended trying a statin for  possible microvascular ischemic strokes and secondary prevention.  Patient declines a statin  11/04/19 Patient is here today with his wife for checkup.  He states that the myoclonic jerking has improved.  He saw a holistic doctor who seem to really help him with that and for that I am grateful.  Overall he is doing well.  He continues to have apraxia.  His biggest problem is word finding difficulties.  Both he and his wife deny any progression of short-term memory loss.  He does not forget conversations.  He is not getting lost driving.  He is not forgetting how to do or operate machinery or how to do things he is always done.  He is not losing money or leaving on electrical appliances.  Overall he is doing very well aside from the apraxia which appears stable.  He denies any chest pain shortness of breath or dyspnea on exertion.  He is still not on a statin despite having 2 small punctate strokes seen on his MRI and a history of coronary artery disease.  We spent 15 minutes discussing this including the potential to help prevent future strokes and heart attacks and the patient is willing to try low-dose of this in the future.  He is already on an aspirin.  02/10/20 Patient is a very pleasant 69 year old Caucasian male who presents today with his wife reporting numbness in his feet.  The right foot  is worse than the left foot.  He states that is been going on for at least the last 6 or 7 months.  The entire foot will feel numb.  Is not just limited to his toes.  He occasionally gets some burning and staying pain but for the majority of the time is numb.  The numbness does not extend onto the dorsum of the foot.  It does not spread up and down the leg.  It is limited to the plantar surface of the foot.  Patient works in Architect for many many years and was constant on his feet at work.  He denies any low back pain.  He denies any lumbar radiculopathy.  He denies any heavy metal exposure.  He has 2/4 dorsalis  pedis and posterior tibialis pulses bilaterally with no evidence of peripheral vascular disease.  He has normal reflexes 2/4 at the knee as well as at the Achilles.  He has normal sensation all over his lower extremities.  He actually has normal sensation to 10 g monofilament in all 5 of his toes on both feet.  He has normal sensation to vibration as well as to heat and cold.  He just reports diminished sensation.  (He states that at times it feels hard for him to touch the gas pedal and to feel it.  His chiropractor recommended that we check him for diabetes.  He does have a history of hyperlipidemia however he has not been taking his Lipitor.  I question the patient about heavy alcohol use.  He does drink light beer but both he and his wife deny heavy alcohol consumption.  Past Medical History:  Diagnosis Date  . Carpal tunnel syndrome on both sides   . Dementia ([place])    diagnosed in jan 2019, pt states medication seems to be helping, A&Ox4  . HLD (hyperlipidemia)   . Hypertension   . Memory disorder 11/24/2017  . Myocardial infarction [facility])    Dr. Terrence Dupont  . Spinal stenosis of cervical region    Past Surgical History:  Procedure Laterality Date  . ABDOMINAL SURGERY    . CARDIAC CATHETERIZATION  2005   stent  . CARPAL TUNNEL RELEASE Right   . CARPAL TUNNEL RELEASE Right 03/09/2018   Procedure: RIGHT CARPAL TUNNEL RELEASE;  Surgeon: Daryll Brod, MD;  Location: [place];  Service: Orthopedics;  Laterality: Right;  . CARPAL TUNNEL RELEASE Left 11/02/2018   Procedure: LEFT CARPAL TUNNEL RELEASE;  Surgeon: Daryll Brod, MD;  Location: [place];  Service: Orthopedics;  Laterality: Left;  . TENDON TRANSFER Left 11/02/2018   Procedure: LEFT CAMITZ TRANSFER;  Surgeon: Daryll Brod, MD;  Location: [place];  Service: Orthopedics;  Laterality: Left;  . TRIGGER FINGER RELEASE Right 03/09/2018   Procedure: RELEASE TRIGGER FINGER/A-1 PULLEY RIGHT MIDDLE, RING  AND SMALL FINGERS;  Surgeon: Daryll Brod, MD;  Location: [place];  Service: Orthopedics;  Laterality: Right;  . ULNAR NERVE TRANSPOSITION Left 11/02/2018   Procedure: LEFT ULNAR NERVE DECOMPRESSION;  Surgeon: Daryll Brod, MD;  Location: [place];  Service: Orthopedics;  Laterality: Left;  . ULNAR TUNNEL RELEASE Right 03/09/2018   Procedure: CUBITAL TUNNEL DECOMPRESSION RIGHT ELBOW;  Surgeon: Daryll Brod, MD;  Location: [place];  Service: Orthopedics;  Laterality: Right;   Current Outpatient Medications on File Prior to Visit  Medication Sig Dispense Refill  . aspirin EC 81 MG tablet Take 81 mg by mouth daily.    Marland Kitchen  Coconut Oil 1000 MG CAPS Take by mouth.    . Coenzyme Q10 (COQ-10) 200 MG CAPS Take by mouth.    . donepezil (ARICEPT) 10 MG tablet TAKE 1 TABLET BY MOUTH EVERYDAY AT BEDTIME 90 tablet 0  . ezetimibe (ZETIA) 10 MG tablet Take 10 mg by mouth daily.    Marland Kitchen losartan (COZAAR) 50 MG tablet Take 50 mg by mouth daily.    . Omega-3 Fatty Acids (FISH OIL) 1000 MG CAPS Take by mouth.    Marland Kitchen atorvastatin (LIPITOR) 10 MG tablet Take 1 tablet (10 mg total) by mouth daily. (Patient not taking: Reported on 02/10/2020) 90 tablet 3   No current facility-administered medications on file prior to visit.   Allergies  Allergen Reactions  . Penicillin G Anaphylaxis   Social History   Socioeconomic History  . Marital status: Married    Spouse name: Not on file  . Number of children: Not on file  . Years of education: Not on file  . Highest education level: Not on file  Occupational History  . Not on file  Tobacco Use  . Smoking status: Former Research scientist (life sciences)  . Smokeless tobacco: Former Systems developer  . Tobacco comment: quit 1990  Substance and Sexual Activity  . Alcohol use: Yes    Alcohol/week: 24.0 standard drinks    Types: 24 Cans of beer per week    Comment: social beer only  . Drug use: No  . Sexual activity: Yes  Other Topics Concern  . Not on file    Social History Narrative   Lives with wife   Right handed    Social Determinants of Health   Financial Resource Strain:   . Difficulty of Paying Living Expenses:   Food Insecurity:   . Worried About Charity fundraiser in the Last Year:   . Arboriculturist in the Last Year:   Transportation Needs:   . Film/video editor (Medical):   Marland Kitchen Lack of Transportation (Non-Medical):   Physical Activity:   . Days of Exercise per Week:   . Minutes of Exercise per Session:   Stress:   . Feeling of Stress :   Social Connections:   . Frequency of Communication with Friends and Family:   . Frequency of Social Gatherings with Friends and Family:   . Attends Religious Services:   . Active Member of Clubs or Organizations:   . Attends Archivist Meetings:   Marland Kitchen Marital Status:   Intimate Partner Violence:   . Fear of Current or Ex-Partner:   . Emotionally Abused:   Marland Kitchen Physically Abused:   . Sexually Abused:    No family history on file.     Review of Systems  All other systems reviewed and are negative.      Objective:   Physical Exam  Constitutional: He is oriented to person, place, and time. He appears well-developed and well-nourished. No distress.  HENT:  Head: Normocephalic and atraumatic.  Right Ear: External ear normal.  Left Ear: External ear normal.  Nose: Nose normal.  Mouth/Throat: Oropharynx is clear and moist. No oropharyngeal exudate.  Eyes: Pupils are equal, round, and reactive to light. Conjunctivae and EOM are normal. Right eye exhibits no discharge. Left eye exhibits no discharge. No scleral icterus.  Neck: No JVD present. No thyromegaly present.  Cardiovascular: Normal rate, regular rhythm, normal heart sounds and intact distal pulses. Exam reveals no gallop and no friction rub.  No murmur heard. Pulmonary/Chest: Effort normal and  breath sounds normal. No respiratory distress. He has no wheezes. He has no rales. He exhibits no tenderness.   Abdominal: Soft. Bowel sounds are normal. He exhibits no distension and no mass. There is no abdominal tenderness. There is no rebound and no guarding.  Musculoskeletal:     Cervical back: Normal range of motion and neck supple.  Lymphadenopathy:    He has no cervical adenopathy.  Neurological: He is alert and oriented to person, place, and time. He has normal reflexes. He displays tremor. He displays normal reflexes. No cranial nerve deficit or sensory deficit. He exhibits normal muscle tone. Coordination and gait normal.  Reflex Scores:      Patellar reflexes are 2+ on the right side and 2+ on the left side.      Achilles reflexes are 2+ on the right side and 2+ on the left side. Skin: He is not diaphoretic.  Psychiatric: He has a normal mood and affect. His behavior is normal. Judgment and thought content normal.  Vitals reviewed.         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  I will check a CBC, CMP, TSH, vitamin B12, and hemoglobin A1c.  However I suspect this is most likely idiopathic.  I do not expect heavy metal poisoning.  I did recommend avoidance of alcohol as this could exacerbate peripheral neuropathy however he denies heavy alcohol consumption.

## 2020-02-11 LAB — CBC WITH DIFFERENTIAL/PLATELET
Absolute Monocytes: 405 cells/uL (ref 200–950)
Basophils Absolute: 50 cells/uL (ref 0–200)
Basophils Relative: 1 %
Eosinophils Absolute: 220 cells/uL (ref 15–500)
Eosinophils Relative: 4.4 %
HCT: 39.4 % (ref 38.5–50.0)
Hemoglobin: 14 g/dL (ref 13.2–17.1)
Lymphs Abs: 735 cells/uL — ABNORMAL LOW (ref 850–3900)
MCH: 32.2 pg (ref 27.0–33.0)
MCHC: 35.5 g/dL (ref 32.0–36.0)
MCV: 90.6 fL (ref 80.0–100.0)
MPV: 9.5 fL (ref 7.5–12.5)
Monocytes Relative: 8.1 %
Neutro Abs: 3590 cells/uL (ref 1500–7800)
Neutrophils Relative %: 71.8 %
Platelets: 222 10*3/uL (ref 140–400)
RBC: 4.35 10*6/uL (ref 4.20–5.80)
RDW: 11.9 % (ref 11.0–15.0)
Total Lymphocyte: 14.7 %
WBC: 5 10*3/uL (ref 3.8–10.8)

## 2020-02-11 LAB — COMPLETE METABOLIC PANEL WITH GFR
AG Ratio: 2.2 (calc) (ref 1.0–2.5)
ALT: 20 U/L (ref 9–46)
AST: 21 U/L (ref 10–35)
Albumin: 4.7 g/dL (ref 3.6–5.1)
Alkaline phosphatase (APISO): 66 U/L (ref 35–144)
BUN: 10 mg/dL (ref 7–25)
CO2: 31 mmol/L (ref 20–32)
Calcium: 10.1 mg/dL (ref 8.6–10.3)
Chloride: 105 mmol/L (ref 98–110)
Creat: 0.89 mg/dL (ref 0.70–1.25)
GFR, Est African American: 103 mL/min/{1.73_m2} (ref >=60)
GFR, Est Non African American: 88 mL/min/{1.73_m2} (ref >=60)
Globulin: 2.1 g/dL (calc) (ref 1.9–3.7)
Glucose, Bld: 106 mg/dL — ABNORMAL HIGH (ref 65–99)
Potassium: 4.7 mmol/L (ref 3.5–5.3)
Sodium: 143 mmol/L (ref 135–146)
Total Bilirubin: 0.9 mg/dL (ref 0.2–1.2)
Total Protein: 6.8 g/dL (ref 6.1–8.1)

## 2020-02-11 LAB — TSH: TSH: 0.89 mIU/L (ref 0.40–4.50)

## 2020-02-11 LAB — HEMOGLOBIN A1C
Hgb A1c MFr Bld: 5.1 % of total Hgb (ref <5.7)
Mean Plasma Glucose: 100 (calc)
eAG (mmol/L): 5.5 (calc)

## 2020-02-11 LAB — VITAMIN B12: Vitamin B-12: 668 pg/mL (ref 200–1100)

## 2020-02-15 ENCOUNTER — Telehealth: Payer: Self-pay | Admitting: Family Medicine

## 2020-02-15 NOTE — Telephone Encounter (Signed)
#  [phone] Pt stop by the office would like to know is lab result.

## 2020-02-16 DIAGNOSIS — M4802 Spinal stenosis, cervical region: Secondary | ICD-10-CM | POA: Diagnosis not present

## 2020-02-16 DIAGNOSIS — M9904 Segmental and somatic dysfunction of sacral region: Secondary | ICD-10-CM | POA: Diagnosis not present

## 2020-02-16 DIAGNOSIS — M50323 Other cervical disc degeneration at C6-C7 level: Secondary | ICD-10-CM | POA: Diagnosis not present

## 2020-02-16 DIAGNOSIS — M4722 Other spondylosis with radiculopathy, cervical region: Secondary | ICD-10-CM | POA: Diagnosis not present

## 2020-02-16 DIAGNOSIS — M9901 Segmental and somatic dysfunction of cervical region: Secondary | ICD-10-CM | POA: Diagnosis not present

## 2020-02-16 DIAGNOSIS — M9902 Segmental and somatic dysfunction of thoracic region: Secondary | ICD-10-CM | POA: Diagnosis not present

## 2020-02-16 DIAGNOSIS — M50321 Other cervical disc degeneration at C4-C5 level: Secondary | ICD-10-CM | POA: Diagnosis not present

## 2020-02-16 DIAGNOSIS — M50322 Other cervical disc degeneration at C5-C6 level: Secondary | ICD-10-CM | POA: Diagnosis not present

## 2020-02-16 NOTE — Telephone Encounter (Signed)
Patient made aware of labs on 02/15/2020.

## 2020-02-22 DIAGNOSIS — M50323 Other cervical disc degeneration at C6-C7 level: Secondary | ICD-10-CM | POA: Diagnosis not present

## 2020-02-22 DIAGNOSIS — M9904 Segmental and somatic dysfunction of sacral region: Secondary | ICD-10-CM | POA: Diagnosis not present

## 2020-02-22 DIAGNOSIS — M9902 Segmental and somatic dysfunction of thoracic region: Secondary | ICD-10-CM | POA: Diagnosis not present

## 2020-02-22 DIAGNOSIS — M50321 Other cervical disc degeneration at C4-C5 level: Secondary | ICD-10-CM | POA: Diagnosis not present

## 2020-02-22 DIAGNOSIS — M4802 Spinal stenosis, cervical region: Secondary | ICD-10-CM | POA: Diagnosis not present

## 2020-02-22 DIAGNOSIS — M9901 Segmental and somatic dysfunction of cervical region: Secondary | ICD-10-CM | POA: Diagnosis not present

## 2020-02-22 DIAGNOSIS — M50322 Other cervical disc degeneration at C5-C6 level: Secondary | ICD-10-CM | POA: Diagnosis not present

## 2020-02-22 DIAGNOSIS — M4722 Other spondylosis with radiculopathy, cervical region: Secondary | ICD-10-CM | POA: Diagnosis not present

## 2020-04-16 ENCOUNTER — Telehealth: Payer: Self-pay | Admitting: Family Medicine

## 2020-04-16 NOTE — Progress Notes (Signed)
  Chronic Care Management   Outreach Note  04/16/2020 Name: Jay Rivas MRN: [id number] DOB: 10-23-1951  Referred by: Susy Frizzle, MD Reason for referral : No chief complaint on file.   An unsuccessful telephone outreach was attempted today. The patient was referred to the pharmacist for assistance with care management and care coordination.   Follow Up Plan:   [name]

## 2020-05-02 ENCOUNTER — Telehealth: Payer: Self-pay | Admitting: Family Medicine

## 2020-05-02 NOTE — Progress Notes (Signed)
  Chronic Care Management   Outreach Note  05/02/2020 Name: Jay Rivas MRN: [id number] DOB: [birth year]  Referred by: Susy Frizzle, MD Reason for referral : No chief complaint on file.   A second unsuccessful telephone outreach was attempted today. The patient was referred to pharmacist for assistance with care management and care coordination.  Follow Up Plan:   [place]

## 2020-05-07 ENCOUNTER — Telehealth: Payer: Self-pay | Admitting: Family Medicine

## 2020-05-07 NOTE — Progress Notes (Signed)
  Chronic Care Management   Outreach Note  05/07/2020 Name: Jay Rivas MRN: [id number] DOB: 10-22-1951  Referred by: Susy Frizzle, MD Reason for referral : No chief complaint on file.   Third unsuccessful telephone outreach was attempted today. The patient was referred to the pharmacist for assistance with care management and care coordination.   Follow Up Plan:   [place]

## 2020-05-11 DIAGNOSIS — I10 Essential (primary) hypertension: Secondary | ICD-10-CM | POA: Diagnosis not present

## 2020-05-11 DIAGNOSIS — R7303 Prediabetes: Secondary | ICD-10-CM | POA: Diagnosis not present

## 2020-05-11 DIAGNOSIS — E785 Hyperlipidemia, unspecified: Secondary | ICD-10-CM | POA: Diagnosis not present

## 2020-05-11 DIAGNOSIS — I2510 Atherosclerotic heart disease of native coronary artery without angina pectoris: Secondary | ICD-10-CM | POA: Diagnosis not present

## 2020-05-31 ENCOUNTER — Ambulatory Visit: Payer: Medicare Other | Attending: Critical Care Medicine

## 2020-05-31 DIAGNOSIS — Z23 Encounter for immunization: Secondary | ICD-10-CM

## 2020-05-31 NOTE — Progress Notes (Signed)
   Covid-19 Vaccination Clinic  Name:  Jay Rivas    MRN: [id number] DOB: 07/15/1951  05/31/2020  [name] was observed post Covid-19 immunization for 30 minutes based on pre-vaccination screening without incident. He was provided with Vaccine Information Sheet and instruction to access the V-Safe system.   [name] was instructed to call 911 with any severe reactions post vaccine: Marland Kitchen Difficulty breathing  . Swelling of face and throat  . A fast heartbeat  . A bad rash all over body  . Dizziness and weakness   Immunizations Administered    Name Date Dose VIS Date Route   Pfizer COVID-19 Vaccine 05/31/2020 11:21 AM 0.3 mL 12/21/2018 Intramuscular   Manufacturer: [employer], [employer]   Lot: [id number]   [place]: [address]-7

## 2020-06-21 ENCOUNTER — Ambulatory Visit: Payer: Medicare Other | Attending: Internal Medicine

## 2020-06-21 DIAGNOSIS — Z23 Encounter for immunization: Secondary | ICD-10-CM

## 2020-06-21 NOTE — Progress Notes (Addendum)
   Covid-19 Vaccination Clinic  Name:  Jay Rivas    MRN: [id number] DOB: 20-Apr-1951  06/21/2020  Mr. Drewes was observed  based on post Covid-19 immunization for 30 minutes based on pre-vaccination screening without incident. He was provided with Vaccine Information Sheet and instruction to access the V-Safe system.   Mr. Pat was instructed to call 911 with any severe reactions post vaccine: Marland Kitchen Difficulty breathing  . Swelling of face and throat  . A fast heartbeat  . A bad rash all over body  . Dizziness and weakness   Immunizations Administered    Name Date Dose VIS Date Route   Pfizer COVID-19 Vaccine 06/21/2020  9:06 AM 0.3 mL 12/21/2018 Intramuscular   Manufacturer: [place]   Lot: [id number]   [place]: [address]-5

## 2020-06-25 ENCOUNTER — Telehealth: Payer: Self-pay | Admitting: Neurology

## 2020-06-25 NOTE — Telephone Encounter (Signed)
Okay with me for the switch to any Dr. here in the office, he has not been seen here since 2019, was followed through [facility] neurology.

## 2020-06-25 NOTE — Telephone Encounter (Signed)
Pt's wife called stating that he is wanting to switch providers for his memory loss. She did not specify which provider he would like to switch to. Please advise.

## 2020-07-30 DIAGNOSIS — G5603 Carpal tunnel syndrome, bilateral upper limbs: Secondary | ICD-10-CM | POA: Diagnosis not present

## 2020-08-07 DIAGNOSIS — R531 Weakness: Secondary | ICD-10-CM | POA: Diagnosis not present

## 2020-08-07 DIAGNOSIS — F0390 Unspecified dementia without behavioral disturbance: Secondary | ICD-10-CM | POA: Diagnosis not present

## 2020-08-07 DIAGNOSIS — G253 Myoclonus: Secondary | ICD-10-CM | POA: Insufficient documentation

## 2020-08-15 DIAGNOSIS — C713 Malignant neoplasm of parietal lobe: Secondary | ICD-10-CM | POA: Diagnosis not present

## 2020-08-15 DIAGNOSIS — F0391 Unspecified dementia with behavioral disturbance: Secondary | ICD-10-CM | POA: Diagnosis not present

## 2020-08-15 DIAGNOSIS — G919 Hydrocephalus, unspecified: Secondary | ICD-10-CM | POA: Diagnosis not present

## 2020-08-17 DIAGNOSIS — I2510 Atherosclerotic heart disease of native coronary artery without angina pectoris: Secondary | ICD-10-CM | POA: Diagnosis not present

## 2020-08-17 DIAGNOSIS — I10 Essential (primary) hypertension: Secondary | ICD-10-CM | POA: Diagnosis not present

## 2020-08-17 DIAGNOSIS — E785 Hyperlipidemia, unspecified: Secondary | ICD-10-CM | POA: Diagnosis not present

## 2020-08-17 DIAGNOSIS — R7303 Prediabetes: Secondary | ICD-10-CM | POA: Diagnosis not present

## 2020-08-28 DIAGNOSIS — G5603 Carpal tunnel syndrome, bilateral upper limbs: Secondary | ICD-10-CM | POA: Diagnosis not present

## 2020-08-28 DIAGNOSIS — F0390 Unspecified dementia without behavioral disturbance: Secondary | ICD-10-CM | POA: Diagnosis not present

## 2020-08-28 DIAGNOSIS — G253 Myoclonus: Secondary | ICD-10-CM | POA: Diagnosis not present

## 2020-08-28 DIAGNOSIS — R531 Weakness: Secondary | ICD-10-CM | POA: Diagnosis not present

## 2020-09-11 DIAGNOSIS — R413 Other amnesia: Secondary | ICD-10-CM | POA: Diagnosis not present

## 2020-09-17 DIAGNOSIS — G5603 Carpal tunnel syndrome, bilateral upper limbs: Secondary | ICD-10-CM | POA: Diagnosis not present

## 2020-09-26 DIAGNOSIS — R413 Other amnesia: Secondary | ICD-10-CM | POA: Diagnosis not present

## 2020-10-10 DIAGNOSIS — F0280 Dementia in other diseases classified elsewhere without behavioral disturbance: Secondary | ICD-10-CM | POA: Diagnosis not present

## 2020-10-10 DIAGNOSIS — R413 Other amnesia: Secondary | ICD-10-CM | POA: Diagnosis not present

## 2020-10-15 ENCOUNTER — Other Ambulatory Visit: Payer: Self-pay | Admitting: Neurology

## 2020-10-16 ENCOUNTER — Other Ambulatory Visit: Payer: Self-pay

## 2020-10-16 MED ORDER — DONEPEZIL HCL 10 MG PO TABS: ORAL_TABLET | ORAL | 3 refills | Status: DC

## 2020-10-30 ENCOUNTER — Other Ambulatory Visit: Payer: Self-pay | Admitting: Family Medicine

## 2020-11-20 DIAGNOSIS — E785 Hyperlipidemia, unspecified: Secondary | ICD-10-CM | POA: Diagnosis not present

## 2020-11-20 DIAGNOSIS — I2510 Atherosclerotic heart disease of native coronary artery without angina pectoris: Secondary | ICD-10-CM | POA: Diagnosis not present

## 2020-11-20 DIAGNOSIS — R7303 Prediabetes: Secondary | ICD-10-CM | POA: Diagnosis not present

## 2020-11-20 DIAGNOSIS — I10 Essential (primary) hypertension: Secondary | ICD-10-CM | POA: Diagnosis not present

## 2020-11-23 DIAGNOSIS — E785 Hyperlipidemia, unspecified: Secondary | ICD-10-CM | POA: Diagnosis not present

## 2020-11-23 DIAGNOSIS — I10 Essential (primary) hypertension: Secondary | ICD-10-CM | POA: Diagnosis not present

## 2020-11-23 DIAGNOSIS — I2510 Atherosclerotic heart disease of native coronary artery without angina pectoris: Secondary | ICD-10-CM | POA: Diagnosis not present

## 2020-11-23 DIAGNOSIS — F0390 Unspecified dementia without behavioral disturbance: Secondary | ICD-10-CM | POA: Diagnosis not present

## 2020-11-23 DIAGNOSIS — R7303 Prediabetes: Secondary | ICD-10-CM | POA: Diagnosis not present

## 2020-12-25 DIAGNOSIS — D225 Melanocytic nevi of trunk: Secondary | ICD-10-CM | POA: Diagnosis not present

## 2020-12-25 DIAGNOSIS — D2262 Melanocytic nevi of left upper limb, including shoulder: Secondary | ICD-10-CM | POA: Diagnosis not present

## 2020-12-25 DIAGNOSIS — D2272 Melanocytic nevi of left lower limb, including hip: Secondary | ICD-10-CM | POA: Diagnosis not present

## 2020-12-25 DIAGNOSIS — D2261 Melanocytic nevi of right upper limb, including shoulder: Secondary | ICD-10-CM | POA: Diagnosis not present

## 2020-12-25 DIAGNOSIS — X32XXXA Exposure to sunlight, initial encounter: Secondary | ICD-10-CM | POA: Diagnosis not present

## 2020-12-25 DIAGNOSIS — D2271 Melanocytic nevi of right lower limb, including hip: Secondary | ICD-10-CM | POA: Diagnosis not present

## 2020-12-25 DIAGNOSIS — L570 Actinic keratosis: Secondary | ICD-10-CM | POA: Diagnosis not present

## 2020-12-25 DIAGNOSIS — D0461 Carcinoma in situ of skin of right upper limb, including shoulder: Secondary | ICD-10-CM | POA: Diagnosis not present

## 2020-12-25 DIAGNOSIS — D485 Neoplasm of uncertain behavior of skin: Secondary | ICD-10-CM | POA: Diagnosis not present

## 2020-12-25 DIAGNOSIS — L821 Other seborrheic keratosis: Secondary | ICD-10-CM | POA: Diagnosis not present

## 2020-12-31 DIAGNOSIS — C44622 Squamous cell carcinoma of skin of right upper limb, including shoulder: Secondary | ICD-10-CM | POA: Diagnosis not present

## 2020-12-31 DIAGNOSIS — D0461 Carcinoma in situ of skin of right upper limb, including shoulder: Secondary | ICD-10-CM | POA: Diagnosis not present

## 2021-01-01 DIAGNOSIS — G5603 Carpal tunnel syndrome, bilateral upper limbs: Secondary | ICD-10-CM | POA: Diagnosis not present

## 2021-01-01 DIAGNOSIS — F0390 Unspecified dementia without behavioral disturbance: Secondary | ICD-10-CM | POA: Diagnosis not present

## 2021-01-01 DIAGNOSIS — G253 Myoclonus: Secondary | ICD-10-CM | POA: Diagnosis not present

## 2021-03-01 DIAGNOSIS — I10 Essential (primary) hypertension: Secondary | ICD-10-CM | POA: Diagnosis not present

## 2021-03-01 DIAGNOSIS — R7303 Prediabetes: Secondary | ICD-10-CM | POA: Diagnosis not present

## 2021-03-01 DIAGNOSIS — I2510 Atherosclerotic heart disease of native coronary artery without angina pectoris: Secondary | ICD-10-CM | POA: Diagnosis not present

## 2021-03-01 DIAGNOSIS — E785 Hyperlipidemia, unspecified: Secondary | ICD-10-CM | POA: Diagnosis not present

## 2021-03-01 DIAGNOSIS — F0390 Unspecified dementia without behavioral disturbance: Secondary | ICD-10-CM | POA: Diagnosis not present

## 2021-04-25 DIAGNOSIS — G5603 Carpal tunnel syndrome, bilateral upper limbs: Secondary | ICD-10-CM | POA: Diagnosis not present

## 2021-04-30 ENCOUNTER — Other Ambulatory Visit: Payer: Self-pay

## 2021-04-30 ENCOUNTER — Ambulatory Visit (INDEPENDENT_AMBULATORY_CARE_PROVIDER_SITE_OTHER): Payer: Medicare Other | Admitting: Family Medicine

## 2021-04-30 VITALS — BP 130/82 | HR 80 | Temp 98.6°F | Resp 14 | Ht 68.0 in | Wt 158.0 lb

## 2021-04-30 DIAGNOSIS — Z125 Encounter for screening for malignant neoplasm of prostate: Secondary | ICD-10-CM

## 2021-04-30 DIAGNOSIS — G3185 Corticobasal degeneration: Secondary | ICD-10-CM

## 2021-04-30 LAB — PSA: PSA: 0.25 ng/mL (ref <=4.00)

## 2021-04-30 NOTE — Progress Notes (Signed)
Subjective:    Patient ID: Jay Rivas, male    DOB: 10/18/1951, 69 y.o.   MRN: [id number]  Patient is here today requesting prostate cancer screening.  Since I last saw him, he is seen neurology.  Due to worsening myoclonus in his right arm as well as apraxia, the patient has been diagnosed with corticobasal degeneration.  He has developed trouble with shuffling gait.  He also continues to have jerking movements in his right upper extremity and is now developing trouble speaking.  Memory loss seems to be relatively stable.  Past Medical History:  Diagnosis Date  . Carpal tunnel syndrome on both sides   . Dementia ([place])    diagnosed in jan 2019, pt states medication seems to be helping, A&Ox4  . HLD (hyperlipidemia)   . Hypertension   . Memory disorder 11/24/2017  . Myocardial infarction [facility])    Dr. Terrence Dupont  . Spinal stenosis of cervical region    Past Surgical History:  Procedure Laterality Date  . ABDOMINAL SURGERY    . CARDIAC CATHETERIZATION  2005   stent  . CARPAL TUNNEL RELEASE Right   . CARPAL TUNNEL RELEASE Right 03/09/2018   Procedure: RIGHT CARPAL TUNNEL RELEASE;  Surgeon: Daryll Brod, MD;  Location: [place];  Service: Orthopedics;  Laterality: Right;  . CARPAL TUNNEL RELEASE Left 11/02/2018   Procedure: LEFT CARPAL TUNNEL RELEASE;  Surgeon: Daryll Brod, MD;  Location: [place];  Service: Orthopedics;  Laterality: Left;  . TENDON TRANSFER Left 11/02/2018   Procedure: LEFT CAMITZ TRANSFER;  Surgeon: Daryll Brod, MD;  Location: [place];  Service: Orthopedics;  Laterality: Left;  . TRIGGER FINGER RELEASE Right 03/09/2018   Procedure: RELEASE TRIGGER FINGER/A-1 PULLEY RIGHT MIDDLE, RING AND SMALL FINGERS;  Surgeon: Daryll Brod, MD;  Location: [place];  Service: Orthopedics;  Laterality: Right;  . ULNAR NERVE TRANSPOSITION Left 11/02/2018   Procedure: LEFT ULNAR NERVE DECOMPRESSION;  Surgeon: Daryll Brod, MD;   Location: [place];  Service: Orthopedics;  Laterality: Left;  . ULNAR TUNNEL RELEASE Right 03/09/2018   Procedure: CUBITAL TUNNEL DECOMPRESSION RIGHT ELBOW;  Surgeon: Daryll Brod, MD;  Location: [place];  Service: Orthopedics;  Laterality: Right;   Current Outpatient Medications on File Prior to Visit  Medication Sig Dispense Refill  . aspirin EC 81 MG tablet Take 81 mg by mouth daily.    Marland Kitchen atorvastatin (LIPITOR) 10 MG tablet TAKE 1 TABLET BY MOUTH EVERY DAY 90 tablet 3  . Coconut Oil 1000 MG CAPS Take by mouth.    . Coenzyme Q10 (COQ-10) 200 MG CAPS Take by mouth.    . donepezil (ARICEPT) 10 MG tablet TAKE 1 TABLET BY MOUTH EVERYDAY AT BEDTIME 90 tablet 3  . ezetimibe (ZETIA) 10 MG tablet Take 10 mg by mouth daily.    Marland Kitchen losartan (COZAAR) 50 MG tablet Take 50 mg by mouth daily.    . Omega-3 Fatty Acids (FISH OIL) 1000 MG CAPS Take by mouth.     No current facility-administered medications on file prior to visit.   Allergies  Allergen Reactions  . Penicillin G Anaphylaxis   Social History   Socioeconomic History  . Marital status: Married    Spouse name: Not on file  . Number of children: Not on file  . Years of education: Not on file  . Highest education level: Not on file  Occupational History  . Not on file  Tobacco Use  .  Smoking status: Former    Pack years: 0.00  . Smokeless tobacco: Former  . Tobacco comments:    quit 1990  Substance and Sexual Activity  . Alcohol use: Yes    Alcohol/week: 24.0 standard drinks    Types: 24 Cans of beer per week    Comment: social beer only  . Drug use: No  . Sexual activity: Yes  Other Topics Concern  . Not on file  Social History Narrative   Lives with wife   Right handed    Social Determinants of Health   Financial Resource Strain: Not on file  Food Insecurity: Not on file  Transportation Needs: Not on file  Physical Activity: Not on file  Stress: Not on file  Social Connections: Not  on file  Intimate Partner Violence: Not on file   No family history on file.     Review of Systems  All other systems reviewed and are negative.     Objective:   Physical Exam Vitals reviewed.  Constitutional:      General: He is not in acute distress.    Appearance: He is well-developed. He is not diaphoretic.  HENT:     Head: Normocephalic and atraumatic.     Right Ear: External ear normal.     Left Ear: External ear normal.     Nose: Nose normal.     Mouth/Throat:     Pharynx: No oropharyngeal exudate.  Eyes:     General: No scleral icterus.       Right eye: No discharge.        Left eye: No discharge.     Conjunctiva/sclera: Conjunctivae normal.     Pupils: Pupils are equal, round, and reactive to light.  Neck:     Thyroid: No thyromegaly.     Vascular: No JVD.  Cardiovascular:     Rate and Rhythm: Normal rate and regular rhythm.     Heart sounds: Normal heart sounds. No murmur heard.   No friction rub. No gallop.  Pulmonary:     Effort: Pulmonary effort is normal. No respiratory distress.     Breath sounds: Normal breath sounds. No wheezing or rales.  Chest:     Chest wall: No tenderness.  Abdominal:     General: Bowel sounds are normal. There is no distension.     Palpations: Abdomen is soft. There is no mass.     Tenderness: There is no abdominal tenderness. There is no guarding or rebound.  Genitourinary:    Prostate: Not enlarged and no nodules present.     Rectum: External hemorrhoid present. No mass or anal fissure. Normal anal tone.  Musculoskeletal:     Cervical back: Normal range of motion and neck supple.  Lymphadenopathy:     Cervical: No cervical adenopathy.  Neurological:     Mental Status: He is alert and oriented to person, place, and time.     Cranial Nerves: No cranial nerve deficit.     Deep Tendon Reflexes: Reflexes are normal and symmetric.  Psychiatric:        Behavior: Behavior normal.        Thought Content: Thought content  normal.        Judgment: Judgment normal.   Myoclonic jerks on the right upper extremity.  Occasional twitching tremor.  Shuffling gait when walking.       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  Rectal exam today shows no prostatic enlargement or prostate nodularity.  We discussed possibly trying a therapeutic trial of Sinemet for his myoclonic jerking and tremor and shuffling gait.  I explained that this is not indicated but it has been tried for his condition for symptomatic improvement.  They will discuss this with his neurologist

## 2021-05-03 ENCOUNTER — Encounter: Payer: Self-pay | Admitting: *Deleted

## 2021-05-07 ENCOUNTER — Telehealth: Payer: Self-pay | Admitting: Family Medicine

## 2021-05-07 NOTE — Telephone Encounter (Signed)
Received call from patient' spouse Jay Rivas for results of recent prostate caner screening. Please advise at [phone]

## 2021-05-07 NOTE — Telephone Encounter (Signed)
Results given to pt's wife

## 2021-05-31 DIAGNOSIS — E785 Hyperlipidemia, unspecified: Secondary | ICD-10-CM | POA: Diagnosis not present

## 2021-05-31 DIAGNOSIS — R7303 Prediabetes: Secondary | ICD-10-CM | POA: Diagnosis not present

## 2021-05-31 DIAGNOSIS — I2510 Atherosclerotic heart disease of native coronary artery without angina pectoris: Secondary | ICD-10-CM | POA: Diagnosis not present

## 2021-05-31 DIAGNOSIS — I10 Essential (primary) hypertension: Secondary | ICD-10-CM | POA: Diagnosis not present

## 2021-06-14 ENCOUNTER — Ambulatory Visit (INDEPENDENT_AMBULATORY_CARE_PROVIDER_SITE_OTHER): Payer: Medicare Other | Admitting: Podiatry

## 2021-06-14 ENCOUNTER — Other Ambulatory Visit: Payer: Self-pay

## 2021-06-14 DIAGNOSIS — M79674 Pain in right toe(s): Secondary | ICD-10-CM

## 2021-06-14 DIAGNOSIS — L84 Corns and callosities: Secondary | ICD-10-CM

## 2021-06-14 NOTE — Progress Notes (Signed)
   Subjective: 71 y.o. male presenting to the office today with his spouse for evaluation of a symptomatic painful toe to the right foot.  Patient has a corn on the right fifth toe.  They have been applying corn and callus remover with minimal improvement.  He has noticed it now for a few weeks.  He presents for further treatment and evaluation   Past Medical History:  Diagnosis Date   Carpal tunnel syndrome on both sides    Dementia ([place])    diagnosed in jan 2019, pt states medication seems to be helping, A&Ox4   HLD (hyperlipidemia)    Hypertension    Memory disorder 11/24/2017   Myocardial infarction [facility])    Dr. Terrence Dupont   Spinal stenosis of cervical region      Objective:  Physical Exam General: Alert and oriented x3 in no acute distress  Dermatology: Hyperkeratotic lesion(s) present on the medial aspect right fifth toe adjacent to the fourth toe. Pain on palpation with a central nucleated core noted. Skin is warm, dry and supple bilateral lower extremities. Negative for open lesions or macerations.  Vascular: Palpable pedal pulses bilaterally. No edema or erythema noted. Capillary refill within normal limits.  Neurological: Epicritic and protective threshold grossly intact bilaterally.   Musculoskeletal Exam: Pain on palpation at the keratotic lesion(s) noted. Range of motion within normal limits bilateral. Muscle strength 5/5 in all groups bilateral.  Assessment: 1.  Symptomatic: Right fifth toe   Plan of Care:  1. Patient evaluated 2. Excisional debridement of keratoic lesion(s) using a chisel blade was performed without incident.  3. Dressed area with light dressing. 4.  Continue corn and callus remover as needed  5.  Recommend wide fitting shoes that do not constrict the toebox area  6.  Patient is to return to the clinic PRN.   Edrick Kins, DPM [facility]  Dr. Edrick Kins, DPM    [address], [place] [address]                Office [phone]  Fax [phone]

## 2021-06-18 DIAGNOSIS — D485 Neoplasm of uncertain behavior of skin: Secondary | ICD-10-CM | POA: Diagnosis not present

## 2021-06-18 DIAGNOSIS — Z08 Encounter for follow-up examination after completed treatment for malignant neoplasm: Secondary | ICD-10-CM | POA: Diagnosis not present

## 2021-06-18 DIAGNOSIS — D044 Carcinoma in situ of skin of scalp and neck: Secondary | ICD-10-CM | POA: Diagnosis not present

## 2021-06-18 DIAGNOSIS — L218 Other seborrheic dermatitis: Secondary | ICD-10-CM | POA: Diagnosis not present

## 2021-06-18 DIAGNOSIS — L570 Actinic keratosis: Secondary | ICD-10-CM | POA: Diagnosis not present

## 2021-06-18 DIAGNOSIS — Z85828 Personal history of other malignant neoplasm of skin: Secondary | ICD-10-CM | POA: Diagnosis not present

## 2021-06-18 DIAGNOSIS — L821 Other seborrheic keratosis: Secondary | ICD-10-CM | POA: Diagnosis not present

## 2021-07-24 DIAGNOSIS — D044 Carcinoma in situ of skin of scalp and neck: Secondary | ICD-10-CM | POA: Diagnosis not present

## 2021-07-24 DIAGNOSIS — D485 Neoplasm of uncertain behavior of skin: Secondary | ICD-10-CM | POA: Diagnosis not present

## 2021-07-24 DIAGNOSIS — D0461 Carcinoma in situ of skin of right upper limb, including shoulder: Secondary | ICD-10-CM | POA: Diagnosis not present

## 2021-07-31 ENCOUNTER — Telehealth: Payer: Self-pay | Admitting: Family Medicine

## 2021-07-31 NOTE — Telephone Encounter (Signed)
Left message for patient to call back and schedule Medicare Annual Wellness Visit (AWV) in office.  ° °If not able to come in office, please offer to do virtually or by telephone.  Left office number and my jabber #[phone]. ° °Due for AWVI ° °Please schedule at anytime with Nurse Health Advisor. °  °

## 2021-08-28 DIAGNOSIS — D0461 Carcinoma in situ of skin of right upper limb, including shoulder: Secondary | ICD-10-CM | POA: Diagnosis not present

## 2021-08-28 DIAGNOSIS — L905 Scar conditions and fibrosis of skin: Secondary | ICD-10-CM | POA: Diagnosis not present

## 2021-08-30 DIAGNOSIS — I2510 Atherosclerotic heart disease of native coronary artery without angina pectoris: Secondary | ICD-10-CM | POA: Diagnosis not present

## 2021-08-30 DIAGNOSIS — I10 Essential (primary) hypertension: Secondary | ICD-10-CM | POA: Diagnosis not present

## 2021-08-30 DIAGNOSIS — E785 Hyperlipidemia, unspecified: Secondary | ICD-10-CM | POA: Diagnosis not present

## 2021-09-06 ENCOUNTER — Telehealth: Payer: Self-pay | Admitting: *Deleted

## 2021-09-06 DIAGNOSIS — Z1211 Encounter for screening for malignant neoplasm of colon: Secondary | ICD-10-CM

## 2021-09-06 DIAGNOSIS — Z1212 Encounter for screening for malignant neoplasm of rectum: Secondary | ICD-10-CM

## 2021-09-06 NOTE — Telephone Encounter (Signed)
Patient due for Cologuard re-screen.   Order placed via [employer].  

## 2021-10-23 ENCOUNTER — Telehealth: Payer: Self-pay | Admitting: Family Medicine

## 2021-10-23 NOTE — Telephone Encounter (Signed)
Left message for patient to call back and schedule Medicare Annual Wellness Visit (AWV) in office.  ° °If not able to come in office, please offer to do virtually or by telephone.  Left office number and my jabber #[phone]. ° °Due for AWVI ° °Please schedule at anytime with Nurse Health Advisor. °  °

## 2021-11-18 ENCOUNTER — Telehealth: Payer: Self-pay | Admitting: Family Medicine

## 2021-11-18 NOTE — Telephone Encounter (Signed)
Left message for patient to call back and schedule Medicare Annual Wellness Visit (AWV) in office.  ° °If not able to come in office, please offer to do virtually or by telephone.  Left office number and my jabber #[phone]. ° °Due for AWVI ° °Please schedule at anytime with Nurse Health Advisor. °  °

## 2021-11-25 DIAGNOSIS — E785 Hyperlipidemia, unspecified: Secondary | ICD-10-CM | POA: Diagnosis not present

## 2021-11-25 DIAGNOSIS — I10 Essential (primary) hypertension: Secondary | ICD-10-CM | POA: Diagnosis not present

## 2021-11-25 DIAGNOSIS — R7303 Prediabetes: Secondary | ICD-10-CM | POA: Diagnosis not present

## 2021-11-29 DIAGNOSIS — E785 Hyperlipidemia, unspecified: Secondary | ICD-10-CM | POA: Diagnosis not present

## 2021-11-29 DIAGNOSIS — R7303 Prediabetes: Secondary | ICD-10-CM | POA: Diagnosis not present

## 2021-11-29 DIAGNOSIS — I2510 Atherosclerotic heart disease of native coronary artery without angina pectoris: Secondary | ICD-10-CM | POA: Diagnosis not present

## 2021-11-29 DIAGNOSIS — I10 Essential (primary) hypertension: Secondary | ICD-10-CM | POA: Diagnosis not present

## 2021-12-05 ENCOUNTER — Other Ambulatory Visit: Payer: Self-pay

## 2021-12-05 MED ORDER — DONEPEZIL HCL 10 MG PO TABS: ORAL_TABLET | ORAL | 3 refills | Status: DC

## 2021-12-12 ENCOUNTER — Ambulatory Visit (INDEPENDENT_AMBULATORY_CARE_PROVIDER_SITE_OTHER): Payer: Medicare Other

## 2021-12-12 VITALS — Ht 68.0 in | Wt 158.0 lb

## 2021-12-12 DIAGNOSIS — Z0000 Encounter for general adult medical examination without abnormal findings: Secondary | ICD-10-CM | POA: Diagnosis not present

## 2021-12-12 DIAGNOSIS — G3185 Corticobasal degeneration: Secondary | ICD-10-CM

## 2021-12-12 NOTE — Patient Instructions (Signed)
[name] , Thank you for taking time to come for your Medicare Wellness Visit. I appreciate your ongoing commitment to your health goals. Please review the following plan we discussed and let me know if I can assist you in the future.   Screening recommendations/referrals: Colonoscopy: Cologuard done 11/26/2018.   Recommended yearly ophthalmology/optometry visit for glaucoma screening and checkup Recommended yearly dental visit for hygiene and checkup  Vaccinations: Influenza vaccine: Done 09/06/2014 Repeat annually  Pneumococcal vaccine: Discussed how to obtain. Tdap vaccine: Done 11/25/2010 Repeat in 10 years  Shingles vaccine: Discussed how to obtain.   Covid-19: Done 05/31/2020 and 06/21/2020  Advanced directives: Advance directive discussed with you today. Even though you declined this today, please call our office should you change your mind, and we can give you the proper paperwork for you to fill out.   Conditions/risks identified: Aim for 30 minutes of exercise or brisk walking each day, drink 6-8 glasses of water and eat lots of fruits and vegetables.   Next appointment: Follow up in one year for your annual wellness visit. 2024.  Preventive Care 57 Years and Older, Male  Preventive care refers to lifestyle choices and visits with your health care provider that can promote health and wellness. What does preventive care include? A yearly physical exam. This is also called an annual well check. Dental exams once or twice a year. Routine eye exams. Ask your health care provider how often you should have your eyes checked. Personal lifestyle choices, including: Daily care of your teeth and gums. Regular physical activity. Eating a healthy diet. Avoiding tobacco and drug use. Limiting alcohol use. Practicing safe sex. Taking low doses of aspirin every day. Taking vitamin and mineral supplements as recommended by your health care provider. What happens during an annual  well check? The services and screenings done by your health care provider during your annual well check will depend on your age, overall health, lifestyle risk factors, and family history of disease. Counseling  Your health care provider may ask you questions about your: Alcohol use. Tobacco use. Drug use. Emotional well-being. Home and relationship well-being. Sexual activity. Eating habits. History of falls. Memory and ability to understand (cognition). Work and work Statistician. Screening  You may have the following tests or measurements: Height, weight, and BMI. Blood pressure. Lipid and cholesterol levels. These may be checked every 5 years, or more frequently if you are over 44 years old. Skin check. Lung cancer screening. You may have this screening every year starting at age 6 if you have a 30-pack-year history of smoking and currently smoke or have quit within the past 15 years. Fecal occult blood test (FOBT) of the stool. You may have this test every year starting at age 63. Flexible sigmoidoscopy or colonoscopy. You may have a sigmoidoscopy every 5 years or a colonoscopy every 10 years starting at age 44. Prostate cancer screening. Recommendations will vary depending on your family history and other risks. Hepatitis C blood test. Hepatitis B blood test. Sexually transmitted disease (STD) testing. Diabetes screening. This is done by checking your blood sugar (glucose) after you have not eaten for a while (fasting). You may have this done every 1-3 years. Abdominal aortic aneurysm (AAA) screening. You may need this if you are a current or former smoker. Osteoporosis. You may be screened starting at age 33 if you are at high risk. Talk with your health care provider about your test results, treatment options, and if necessary, the need for more  tests. Vaccines  Your health care provider may recommend certain vaccines, such as: Influenza vaccine. This is recommended every  year. Tetanus, diphtheria, and acellular pertussis (Tdap, Td) vaccine. You may need a Td booster every 10 years. Zoster vaccine. You may need this after age 45. Pneumococcal 13-valent conjugate (PCV13) vaccine. One dose is recommended after age 79. Pneumococcal polysaccharide (PPSV23) vaccine. One dose is recommended after age 38. Talk to your health care provider about which screenings and vaccines you need and how often you need them. This information is not intended to replace advice given to you by your health care provider. Make sure you discuss any questions you have with your health care provider. Document Released: 11/09/2015 Document Revised: 07/02/2016 Document Reviewed: 08/14/2015 Elsevier Interactive Patient Education  2017 [employer] Prevention in the Home Falls can cause injuries. They can happen to people of all ages. There are many things you can do to make your home safe and to help prevent falls. What can I do on the outside of my home? Regularly fix the edges of walkways and driveways and fix any cracks. Remove anything that might make you trip as you walk through a door, such as a raised step or threshold. Trim any bushes or trees on the path to your home. Use bright outdoor lighting. Clear any walking paths of anything that might make someone trip, such as rocks or tools. Regularly check to see if handrails are loose or broken. Make sure that both sides of any steps have handrails. Any raised decks and porches should have guardrails on the edges. Have any leaves, snow, or ice cleared regularly. Use sand or salt on walking paths during winter. Clean up any spills in your garage right away. This includes oil or grease spills. What can I do in the bathroom? Use night lights. Install grab bars by the toilet and in the tub and shower. Do not use towel bars as grab bars. Use non-skid mats or decals in the tub or shower. If you need to sit down in the shower, use a  plastic, non-slip stool. Keep the floor dry. Clean up any water that spills on the floor as soon as it happens. Remove soap buildup in the tub or shower regularly. Attach bath mats securely with double-sided non-slip rug tape. Do not have throw rugs and other things on the floor that can make you trip. What can I do in the bedroom? Use night lights. Make sure that you have a light by your bed that is easy to reach. Do not use any sheets or blankets that are too big for your bed. They should not hang down onto the floor. Have a firm chair that has side arms. You can use this for support while you get dressed. Do not have throw rugs and other things on the floor that can make you trip. What can I do in the kitchen? Clean up any spills right away. Avoid walking on wet floors. Keep items that you use a lot in easy-to-reach places. If you need to reach something above you, use a strong step stool that has a grab bar. Keep electrical cords out of the way. Do not use floor polish or wax that makes floors slippery. If you must use wax, use non-skid floor wax. Do not have throw rugs and other things on the floor that can make you trip. What can I do with my stairs? Do not leave any items on the stairs. Make sure  that there are handrails on both sides of the stairs and use them. Fix handrails that are broken or loose. Make sure that handrails are as long as the stairways. Check any carpeting to make sure that it is firmly attached to the stairs. Fix any carpet that is loose or worn. Avoid having throw rugs at the top or bottom of the stairs. If you do have throw rugs, attach them to the floor with carpet tape. Make sure that you have a light switch at the top of the stairs and the bottom of the stairs. If you do not have them, ask someone to add them for you. What else can I do to help prevent falls? Wear shoes that: Do not have high heels. Have rubber bottoms. Are comfortable and fit you  well. Are closed at the toe. Do not wear sandals. If you use a stepladder: Make sure that it is fully opened. Do not climb a closed stepladder. Make sure that both sides of the stepladder are locked into place. Ask someone to hold it for you, if possible. Clearly mark and make sure that you can see: Any grab bars or handrails. First and last steps. Where the edge of each step is. Use tools that help you move around (mobility aids) if they are needed. These include: Canes. Walkers. Scooters. Crutches. Turn on the lights when you go into a dark area. Replace any light bulbs as soon as they burn out. Set up your furniture so you have a clear path. Avoid moving your furniture around. If any of your floors are uneven, fix them. If there are any pets around you, be aware of where they are. Review your medicines with your doctor. Some medicines can make you feel dizzy. This can increase your chance of falling. Ask your doctor what other things that you can do to help prevent falls. This information is not intended to replace advice given to you by your health care provider. Make sure you discuss any questions you have with your health care provider. Document Released: 08/09/2009 Document Revised: 03/20/2016 Document Reviewed: 11/17/2014 Elsevier Interactive Patient Education  2017 [employer].

## 2021-12-12 NOTE — Progress Notes (Signed)
Subjective:   Jay Rivas is a 71 y.o. male who presents for an Initial Medicare Annual Wellness Visit. Virtual Visit via Telephone Note  I connected with  Jay Rivas on 12/12/21 at  8:15 AM EST by telephone and verified that I am speaking with the correct person using two identifiers.  Location: Patient: Home Provider: [facility] Persons participating in the virtual visit: patient/Nurse Health Advisor   I discussed the limitations, risks, security and privacy concerns of performing an evaluation and management service by telephone and the availability of in person appointments. The patient expressed understanding and agreed to proceed.  Interactive audio and video telecommunications were attempted between this nurse and patient, however failed, due to patient having technical difficulties OR patient did not have access to video capability.  We continued and completed visit with audio only.  Some vital signs may be absent or patient reported.   Chriss Driver, LPN  Review of Systems     Cardiac Risk Factors include: advanced age (>35men, >20 women);hypertension;male gender;sedentary lifestyle;Other (see comment), Risk factor comments: Dementia     Objective:    Today's Vitals   12/12/21 0818  Weight: 158 lb (71.7 kg)  Height: 5\' 8"  (1.727 m)   Body mass index is 24.02 kg/m.  Advanced Directives 12/12/2021 11/02/2018 10/25/2018 03/09/2018 03/03/2018 01/06/2018  Does Patient Have a Medical Advance Directive? No No No No No No  Would patient like information on creating a medical advance directive? No - Patient declined No - Patient declined No - Patient declined Yes (MAU/Ambulatory/Procedural Areas - Information given) No - Patient declined No - Patient declined    Current Medications (verified) Outpatient Encounter Medications as of 12/12/2021  Medication Sig   aspirin EC 81 MG tablet Take 81 mg by mouth daily.   Coconut Oil 1000 MG CAPS Take by mouth.   Coenzyme Q10  (COQ-10) 200 MG CAPS Take by mouth.   donepezil (ARICEPT) 10 MG tablet TAKE 1 TABLET BY MOUTH EVERYDAY AT BEDTIME   ezetimibe (ZETIA) 10 MG tablet Take 10 mg by mouth daily.   losartan (COZAAR) 100 MG tablet Take 100 mg by mouth daily.   Omega-3 Fatty Acids (FISH OIL) 1000 MG CAPS Take by mouth.   No facility-administered encounter medications on file as of 12/12/2021.    Allergies (verified) Penicillin g   History: Past Medical History:  Diagnosis Date   Carpal tunnel syndrome on both sides    Dementia ([place])    diagnosed in jan 2019, pt states medication seems to be helping, A&Ox4   HLD (hyperlipidemia)    Hypertension    Memory disorder 11/24/2017   Myocardial infarction [facility])    Dr. Terrence Dupont   Spinal stenosis of cervical region    Past Surgical History:  Procedure Laterality Date   ABDOMINAL SURGERY     CARDIAC CATHETERIZATION  2005   stent   CARPAL TUNNEL RELEASE Right    CARPAL TUNNEL RELEASE Right 03/09/2018   Procedure: RIGHT CARPAL TUNNEL RELEASE;  Surgeon: Daryll Brod, MD;  Location: [place];  Service: Orthopedics;  Laterality: Right;   CARPAL TUNNEL RELEASE Left 11/02/2018   Procedure: LEFT CARPAL TUNNEL RELEASE;  Surgeon: Daryll Brod, MD;  Location: [place];  Service: Orthopedics;  Laterality: Left;   TENDON TRANSFER Left 11/02/2018   Procedure: LEFT CAMITZ TRANSFER;  Surgeon: Daryll Brod, MD;  Location: [place];  Service: Orthopedics;  Laterality: Left;   TRIGGER FINGER RELEASE Right  03/09/2018   Procedure: RELEASE TRIGGER FINGER/A-1 PULLEY RIGHT MIDDLE, RING AND SMALL FINGERS;  Surgeon: Daryll Brod, MD;  Location: [place];  Service: Orthopedics;  Laterality: Right;   ULNAR NERVE TRANSPOSITION Left 11/02/2018   Procedure: LEFT ULNAR NERVE DECOMPRESSION;  Surgeon: Daryll Brod, MD;  Location: [place];  Service: Orthopedics;  Laterality: Left;   ULNAR TUNNEL RELEASE Right 03/09/2018    Procedure: CUBITAL TUNNEL DECOMPRESSION RIGHT ELBOW;  Surgeon: Daryll Brod, MD;  Location: [place];  Service: Orthopedics;  Laterality: Right;   No family history on file. Social History   Socioeconomic History   Marital status: Married    Spouse name: Evelena Peat   Number of children: 2   Years of education: Not on file   Highest education level: Not on file  Occupational History   Not on file  Tobacco Use   Smoking status: Former   Smokeless tobacco: Former   Tobacco comments:    quit 1990  Substance and Sexual Activity   Alcohol use: Yes    Alcohol/week: 24.0 standard drinks    Types: 24 Cans of beer per week    Comment: social beer only   Drug use: No   Sexual activity: Yes  Other Topics Concern   Not on file  Social History Narrative   Lives with wife, Evelena Peat.    1 son and 1 daughter.   2 grandchildren.   Right handed    Social Determinants of Health   Financial Resource Strain: Low Risk    Difficulty of Paying Living Expenses: Not hard at all  Food Insecurity: No Food Insecurity   Worried About Charity fundraiser in the Last Year: Never true   Ran Out of Food in the Last Year: Never true  Transportation Needs: No Transportation Needs   Lack of Transportation (Medical): No   Lack of Transportation (Non-Medical): No  Physical Activity: Insufficiently Active   Days of Exercise per Week: 3 days   Minutes of Exercise per Session: 20 min  Stress: No Stress Concern Present   Feeling of Stress : Not at all  Social Connections: Socially Integrated   Frequency of Communication with Friends and Family: More than three times a week   Frequency of Social Gatherings with Friends and Family: More than three times a week   Attends Religious Services: 1 to 4 times per year   Active Member of [employer] or Organizations: Yes   Attends Archivist Meetings: 1 to 4 times per year   Marital Status: Married    Tobacco Counseling Counseling given: Not  Answered Tobacco comments: quit 1990   Clinical Intake:  Pre-visit preparation completed: Yes  Pain : No/denies pain     BMI - recorded: 24.02 Nutritional Status: BMI of 19-24  Normal Nutritional Risks: None Diabetes: No  How often do you need to have someone help you when you read instructions, pamphlets, or other written materials from your doctor or pharmacy?: 1 - Never  Diabetic?no  Interpreter Needed?: No  Information entered by :: MJ Eitan Doubleday, LPN   Activities of Daily Living In your present state of health, do you have any difficulty performing the following activities: 12/12/2021  Hearing? Y  Vision? N  Difficulty concentrating or making decisions? Y  Comment Due to dementia diagnosis.  Walking or climbing stairs? N  Dressing or bathing? N  Doing errands, shopping? Y  Preparing Food and eating ? Y  Using the Toilet? N  In the past six months, have you accidently leaked urine? N  Do you have problems with loss of bowel control? N  Managing your Medications? Y  Managing your Finances? Y  Housekeeping or managing your Housekeeping? Y  Some recent data might be hidden    Patient Care Team: Susy Frizzle, MD as PCP - General (Family Medicine)  Indicate any recent Medical Services you may have received from other than [facility] providers in the past year (date may be approximate).     Assessment:   This is a routine wellness examination for Jay Rivas.  Hearing/Vision screen Hearing Screening - Comments:: Hearing issues.  Vision Screening - Comments:: Readers. Cataracts. 12/20222. [facility].  Dietary issues and exercise activities discussed: Current Exercise Habits: Home exercise routine, Type of exercise: walking, Time (Minutes): 20, Frequency (Times/Week): 3, Weekly Exercise (Minutes/Week): 60, Intensity: Mild, Exercise limited by: cardiac condition(s);neurologic condition(s)   Goals Addressed             This Visit's Progress    Exercise 3x per week  (30 min per time)       Try to increase walking and continue with healthy diet.       Depression Screen PHQ 2/9 Scores 12/12/2021 04/30/2021 11/22/2018 11/20/2017 10/22/2017 09/29/2017  PHQ - 2 Score 0 0 1 0 0 0    Fall Risk Fall Risk  12/12/2021 04/30/2021 05/26/2019 11/20/2017 10/22/2017  Falls in the past year? 1 0 0 No No  Comment - - Emmi Telephone Survey: data to providers prior to load - -  Number falls in past yr: 0 0 - - -  Injury with Fall? 0 0 - - -  Risk for fall due to : History of fall(s);Impaired balance/gait;Mental status change;Other (Comment) No Fall Risks - - -  Risk for fall due to: Comment Pt has dementia. - - - -  Follow up Falls prevention discussed Falls evaluation completed - - -    FALL RISK PREVENTION PERTAINING TO THE HOME:  Any stairs in or around the home? Yes  If so, are there any without handrails? No  Home free of loose throw rugs in walkways, pet beds, electrical cords, etc? Yes  Adequate lighting in your home to reduce risk of falls? Yes   ASSISTIVE DEVICES UTILIZED TO PREVENT FALLS:  Life alert? No  Use of a cane, walker or w/c? No  Grab bars in the bathroom? No  Shower chair or bench in shower? No  Elevated toilet seat or a handicapped toilet? No   TIMED UP AND GO:  Was the test performed? No .  Phone visit.   Cognitive Function: Patient has current diagnosis of cognitive impairment. 12/12/2021. Marland Kitchen Patient is unable to complete screening 6CIT or MMSE.   MMSE - Mini Mental State Exam 05/24/2018 11/24/2017  Orientation to time 5 4  Orientation to Place 5 2  Registration 3 3  Attention/ Calculation 0 0  Recall 2 2  Language- name 2 objects 2 2  Language- repeat 1 1  Language- follow 3 step command 3 2  Language- read & follow direction 1 1  Write a sentence 0 1  Copy design 0 1  Total score 22 19        Immunizations Immunization History  Administered Date(s) Administered   Influenza,inj,Quad PF,6+ Mos 09/06/2014   PFIZER(Purple  Top)SARS-COV-2 Vaccination 05/31/2020, 06/21/2020   Td 11/25/2010   Tdap 11/25/2010    TDAP status: Due, Education has been provided regarding the importance of  this vaccine. Advised may receive this vaccine at local pharmacy or Health Dept. Aware to provide a copy of the vaccination record if obtained from local pharmacy or Health Dept. Verbalized acceptance and understanding.  Flu Vaccine status: Declined, Education has been provided regarding the importance of this vaccine but patient still declined. Advised may receive this vaccine at local pharmacy or Health Dept. Aware to provide a copy of the vaccination record if obtained from local pharmacy or Health Dept. Verbalized acceptance and understanding.  Pneumococcal vaccine status: Due, Education has been provided regarding the importance of this vaccine. Advised may receive this vaccine at local pharmacy or Health Dept. Aware to provide a copy of the vaccination record if obtained from local pharmacy or Health Dept. Verbalized acceptance and understanding.  Covid-19 vaccine status: Completed vaccines  Qualifies for Shingles Vaccine? Yes   Zostavax completed No   Shingrix Completed?: No.    Education has been provided regarding the importance of this vaccine. Patient has been advised to call insurance company to determine out of pocket expense if they have not yet received this vaccine. Advised may also receive vaccine at local pharmacy or Health Dept. Verbalized acceptance and understanding.  Screening Tests Health Maintenance  Topic Date Due   Hepatitis C Screening  Never done   Zoster Vaccines- Shingrix (1 of 2) Never done   Pneumonia Vaccine 3+ Years old (1 - PCV) Never done   COVID-19 Vaccine (3 - Booster for Pfizer series) 08/16/2020   TETANUS/TDAP  11/25/2020   Fecal DNA (Cologuard)  12/12/2022 (Originally 11/26/2021)   INFLUENZA VACCINE  12/18/2022 (Originally 05/27/2021)   HPV VACCINES  Aged Out    Health Maintenance  Health  Maintenance Due  Topic Date Due   Hepatitis C Screening  Never done   Zoster Vaccines- Shingrix (1 of 2) Never done   Pneumonia Vaccine 50+ Years old (1 - PCV) Never done   COVID-19 Vaccine (3 - Booster for [place] series) 08/16/2020   TETANUS/TDAP  11/25/2020    Colorectal cancer screening: Type of screening: Cologuard. Completed 11/26/2018. Repeat every 3 years  Lung Cancer Screening: (Low Dose CT Chest recommended if Age 24-80 years, 30 pack-year currently smoking OR have quit w/in 15years.) does not qualify.   Additional Screening:  Hepatitis C Screening: does qualify; Completed DUE  Vision Screening: Recommended annual ophthalmology exams for early detection of glaucoma and other disorders of the eye. Is the patient up to date with their annual eye exam?  Yes  Who is the provider or what is the name of the office in which the patient attends annual eye exams? [facility], [place] If pt is not established with a provider, would they like to be referred to a provider to establish care? No .   Dental Screening: Recommended annual dental exams for proper oral hygiene  Community Resource Referral / Chronic Care Management: CRR required this visit?  Yes   CCM required this visit?  No      Plan:     I have personally reviewed and noted the following in the patients chart:   Medical and social history Use of alcohol, tobacco or illicit drugs  Current medications and supplements including opioid prescriptions. Patient is not currently taking opioid prescriptions. Functional ability and status Nutritional status Physical activity Advanced directives List of other physicians Hospitalizations, surgeries, and ER visits in previous 12 months Vitals Screenings to include cognitive, depression, and falls Referrals and appointments  In addition, I have reviewed and discussed with  patient certain preventive protocols, quality metrics, and best practice recommendations. A  written personalized care plan for preventive services as well as general preventive health recommendations were provided to patient.     Chriss Driver, LPN   02/02/9277   Nurse Notes: Visit completed with patient and pt's wife, Evelena Peat due to pt's dementia dx. Pt declined repeat Cologuard. Discussed Pneumococcal and Shingrix and how to obtain. Pt's wife asks if there are any resources for dementia patients/caregivers, advised Evelena Peat that there are and discussed [facility] referral. Evelena Peat is agreeable and referral made.

## 2021-12-13 ENCOUNTER — Telehealth: Payer: Self-pay | Admitting: *Deleted

## 2021-12-13 NOTE — Chronic Care Management (AMB) (Signed)
°  Chronic Care Management   Outreach Note  12/13/2021 Name: [name] MRN: [id number] DOB: [birth year]  Jay Rivas is a 71 y.o. year old male who is a primary care patient of Susy Frizzle, MD. I reached out to Delbert Phenix by phone today in response to a referral sent by Mr. Zavior Thomason Wittner's primary care provider.  An unsuccessful telephone outreach was attempted today. The patient was referred to the case management team for assistance with care management and care coordination.   Follow Up Plan: A HIPAA compliant phone message was left for the patient providing contact information and requesting a return call.  The care management team will reach out to the patient again over the next 7-10 days.  If patient returns call to provider office, please advise to call [place]* at [place] [facility]  Direct Dial: [phone]

## 2021-12-20 DIAGNOSIS — D2261 Melanocytic nevi of right upper limb, including shoulder: Secondary | ICD-10-CM | POA: Diagnosis not present

## 2021-12-20 DIAGNOSIS — D2262 Melanocytic nevi of left upper limb, including shoulder: Secondary | ICD-10-CM | POA: Diagnosis not present

## 2021-12-20 DIAGNOSIS — D2272 Melanocytic nevi of left lower limb, including hip: Secondary | ICD-10-CM | POA: Diagnosis not present

## 2021-12-20 DIAGNOSIS — X32XXXA Exposure to sunlight, initial encounter: Secondary | ICD-10-CM | POA: Diagnosis not present

## 2021-12-20 DIAGNOSIS — L821 Other seborrheic keratosis: Secondary | ICD-10-CM | POA: Diagnosis not present

## 2021-12-20 DIAGNOSIS — Z85828 Personal history of other malignant neoplasm of skin: Secondary | ICD-10-CM | POA: Diagnosis not present

## 2021-12-20 DIAGNOSIS — L570 Actinic keratosis: Secondary | ICD-10-CM | POA: Diagnosis not present

## 2021-12-20 DIAGNOSIS — L218 Other seborrheic dermatitis: Secondary | ICD-10-CM | POA: Diagnosis not present

## 2021-12-23 NOTE — Chronic Care Management (AMB) (Signed)
°  Chronic Care Management   Outreach Note  12/23/2021 Name: Jay Rivas MRN: [id number] DOB: [birth year]  Jay Rivas is a 71 y.o. year old male who is a primary care patient of Susy Frizzle, MD. I reached out to Jay Rivas by phone today in response to a referral sent by Jay Rivas's primary care provider.  A second unsuccessful telephone outreach was attempted today. The patient was referred to the case management team for assistance with care management and care coordination.   Follow Up Plan: A HIPAA compliant phone message was left for the patient providing contact information and requesting a return call.  The care management team will reach out to the patient again over the next 7 days.  If patient returns call to provider office, please advise to call [place]* at [phone].*  [facility]  Direct Dial: [phone]

## 2021-12-26 NOTE — Chronic Care Management (AMB) (Signed)
?  Chronic Care Management  ? ?Note ? ?12/26/2021 ?Name: KAYLER RISE MRN: [id number] DOB: 25-Mar-1951 ? ?SEGER JANI is a 72 y.o. year old male who is a primary care patient of Susy Frizzle, MD. I reached out to Delbert Phenix by phone today in response to a referral sent by Mr. Alvah Lagrow Lynch's PCP. ? ?Mr. Saathoff was given information about Chronic Care Management services today including:  ?CCM service includes personalized support from designated clinical staff supervised by his physician, including individualized plan of care and coordination with other care providers ?24/7 contact phone numbers for assistance for urgent and routine care needs. ?Service will only be billed when office clinical staff spend 20 minutes or more in a month to coordinate care. ?Only one practitioner may furnish and bill the service in a calendar month. ?The patient may stop CCM services at any time (effective at the end of the month) by phone call to the office staff. ?The patient is responsible for co-pay (up to 20% after annual deductible is met) if co-pay is required by the individual health plan.  ? ?Spouse Sair Faulcon  verbally agreed to assistance and services provided by embedded care coordination/care management team today. ? ?Follow up plan: ?Telephone appointment with care management team member scheduled for:01/03/22 ? ?Laverda Sorenson  ?Care Guide, Embedded Care Coordination ?[facility]  Care Management  ?Direct Dial: [phone] ? ?

## 2022-01-03 ENCOUNTER — Ambulatory Visit (INDEPENDENT_AMBULATORY_CARE_PROVIDER_SITE_OTHER): Payer: Medicare Other | Admitting: *Deleted

## 2022-01-03 DIAGNOSIS — I10 Essential (primary) hypertension: Secondary | ICD-10-CM

## 2022-01-03 DIAGNOSIS — E785 Hyperlipidemia, unspecified: Secondary | ICD-10-CM

## 2022-01-03 NOTE — Chronic Care Management (AMB) (Signed)
?Chronic Care Management  ? ?CCM RN Visit Note ? ?01/03/2022 ?Name: Jay Rivas MRN: [id number] DOB: [birth year] ? ?Subjective: ?[name] is a 71 y.o. year old male who is a primary care patient of Pickard, Cammie Mcgee, MD. The care management team was consulted for assistance with disease management and care coordination needs.   ? ?Engaged with patient by telephone for initial visit in response to provider referral for case management and/or care coordination services.  ? ?Consent to Services:  ?The patient was given the following information about Chronic Care Management services today, agreed to services, and gave verbal consent: 1. CCM service includes personalized support from designated clinical staff supervised by the primary care provider, including individualized plan of care and coordination with other care providers 2. 24/7 contact phone numbers for assistance for urgent and routine care needs. 3. Service will only be billed when office clinical staff spend 20 minutes or more in a month to coordinate care. 4. Only one practitioner may furnish and bill the service in a calendar month. 5.The patient may stop CCM services at any time (effective at the end of the month) by phone call to the office staff. 6. The patient will be responsible for cost sharing (co-pay) of up to 20% of the service fee (after annual deductible is met). Patient agreed to services and consent obtained. ? ?Patient agreed to services and verbal consent obtained.  ? ?Assessment: Review of patient past medical history, allergies, medications, health status, including review of consultants reports, laboratory and other test data, was performed as part of comprehensive evaluation and provision of chronic care management services.  ? ?SDOH (Social Determinants of Health) assessments and interventions performed:  ?SDOH Interventions   ? ?Flowsheet Row Most Recent Value  ?SDOH Interventions   ?Food Insecurity Interventions Intervention Not  Indicated  ?Transportation Interventions Intervention Not Indicated  ? ?  ?  ? ?[place] ? ?Allergies  ?Allergen Reactions  ? Penicillin G Anaphylaxis  ? ? ?Outpatient Encounter Medications as of 01/03/2022  ?Medication Sig  ? aspirin EC 81 MG tablet Take 81 mg by mouth daily.  ? Coenzyme Q10 (COQ-10) 200 MG CAPS Take by mouth.  ? donepezil (ARICEPT) 10 MG tablet TAKE 1 TABLET BY MOUTH EVERYDAY AT BEDTIME  ? losartan (COZAAR) 100 MG tablet Take 100 mg by mouth daily.  ? Omega-3 Fatty Acids (FISH OIL) 1000 MG CAPS Take by mouth.  ? Coconut Oil 1000 MG CAPS Take by mouth. (Patient not taking: Reported on 01/03/2022)  ? ezetimibe (ZETIA) 10 MG tablet Take 10 mg by mouth daily. (Patient not taking: Reported on 01/03/2022)  ? ?No facility-administered encounter medications on file as of 01/03/2022.  ? ? ?Patient Active Problem List  ? Diagnosis Date Noted  ? HLD (hyperlipidemia)   ? Memory disorder 11/24/2017  ? Hypertension 09/06/2014  ? ? ?Conditions to be addressed/monitored:HTN and HLD ? ?Care Plan : RN Care Manager Plan of Care  ?Updates made by Kassie Mends, RN since 01/03/2022 12:00 AM  ?  ? ?Problem: No plan of care established for management of chronic disease state  (HTN, HLD, memory disorder)   ?Priority: High  ?  ? ?Long-Range Goal: Development of plan of care for chronic disease management  (HTN, HLD, memory disorder)   ?Start Date: 01/03/2022  ?Expected End Date: 07/02/2022  ?Priority: High  ?Note:   ?Current Barriers:  ?Knowledge Deficits related to plan of care for management of HTN, HLD, and memory disorder  ?  Spoke with spouse, Jay Rivas due to patient having memory disorder, per spouse pt has dementia that varies day to day, pt still drives, is independent in all aspects of his care, still mows the yard and gets outside when he can, does not participate in regular exercise program, spouse reports pt is not taking zetia but she is not sure why and will speak with primary care provider about this.  Patient has wrist cuff but does not monitor blood pressure, does not eat a special diet. ?No advanced directives in place- requests information ? ?RNCM Clinical Goal(s):  ?Patient will verbalize understanding of plan for management of HTN and HLD as evidenced by caregiver report, review of EHR and  through collaboration with RN Care manager, provider, and care team.  ? ?Interventions: ?1:1 collaboration with primary care provider regarding development and update of comprehensive plan of care as evidenced by provider attestation and co-signature ?Inter-disciplinary care team collaboration (see longitudinal plan of care) ?Evaluation of current treatment plan related to  self management and patient's adherence to plan as established by provider ? ? ?Hyperlipidemia:  (Status: New goal. Goal on Track (progressing): YES.) Long Term Goal  ?Lab Results  ?Component Value Date  ? CHOL 188 11/04/2019  ? HDL 60 11/04/2019  ? [place] 105 (H) 11/04/2019  ? TRIG 130 11/04/2019  ? CHOLHDL 3.1 11/04/2019  ?  ? ?Medication review performed; medication list updated in electronic medical record.  ?Provider established cholesterol goals reviewed; ?Provided HLD educational materials; ?Reviewed role and benefits of statin for ASCVD risk reduction; ?Reviewed importance of limiting foods high in cholesterol; ?Screening for signs and symptoms of depression related to chronic disease state;  ?Assessed social determinant of health barriers;  ?Education mailed- heart healthy diet ?Reviewed importance of taking zetia as prescribed and if there is a reason for not taking it, please discuss with your doctor ? ? ?Hypertension Interventions:  (Status:  New goal. and Goal on track:  Yes.) Long Term Goal ?Last practice recorded BP readings:  ?BP Readings from Last 3 Encounters:  ?04/30/21 130/82  ?02/10/20 (!) 144/78  ?11/04/19 (!) 142/84  ?Most recent eGFR/CrCl: No results found for: EGFR  No components found for: CRCL ? ?Evaluation of current  treatment plan related to hypertension self management and patient's adherence to plan as established by provider ?Reviewed medications with patient and discussed importance of compliance ?Discussed plans with patient for ongoing care management follow up and provided patient with direct contact information for care management team ?Advised patient, providing education and rationale, to monitor blood pressure daily and record, calling PCP for findings outside established parameters  ?Education mailed- low sodium diet ?Advanced directives packet mailed ? ?Patient Goals/Self-Care Activities: ?Take medications as prescribed   ?Attend all scheduled provider appointments ?Call pharmacy for medication refills 3-7 days in advance of running out of medications ?Attend church or other social activities ?Perform all self care activities independently  ?Call provider office for new concerns or questions  ?check blood pressure weekly ?choose a place to take my blood pressure (home, clinic or office, retail store) ?write blood pressure results in a log or diary ?take blood pressure log to all doctor appointments ?- call for medicine refill 2 or 3 days before it runs out ?- take all medications exactly as prescribed ?- call doctor with any symptoms you believe are related to your medicine ?- call doctor when you experience any new symptoms ?- go to all doctor appointments as scheduled ?- adhere to prescribed diet:  heart healthy and low sodium ?Bake or broil foods instead of frying ?Talk to your doctor about why you are not taking zetia/ benefits versus risks  ?Look over education mailed- low sodium and heart healthy diet ?Look over and complete advanced directives packet mailed ? ? ?  ? ? ?Plan:Telephone follow up appointment with care management team member scheduled for:  03/17/22 ? ?Jacqlyn Larsen RNC, BSN ?RN Case Manager ?[place] ?[phone] ? ? ? ? ? ? ? ? ? ?

## 2022-01-03 NOTE — Patient Instructions (Signed)
Visit Information   Thank you for taking time to visit with me today. Please don't hesitate to contact me if I can be of assistance to you before our next scheduled telephone appointment.  Following are the goals we discussed today:  Take medications as prescribed   Attend all scheduled provider appointments Call pharmacy for medication refills 3-7 days in advance of running out of medications Attend church or other social activities Perform all self care activities independently  Call provider office for new concerns or questions  check blood pressure weekly choose a place to take my blood pressure (home, clinic or office, retail store) write blood pressure results in a log or diary take blood pressure log to all doctor appointments call for medicine refill 2 or 3 days before it runs out take all medications exactly as prescribed call doctor with any symptoms you believe are related to your medicine call doctor when you experience any new symptoms go to all doctor appointments as scheduled adhere to prescribed diet: heart healthy and low sodium Bake or broil foods instead of frying Talk to your doctor about why you are not taking zetia/ benefits versus risks  Look over education mailed- low sodium and heart healthy diet Look over and complete advanced directives packet mailed  Our next appointment is by telephone on 03/17/22 at 9 am  Please call the care guide team at [phone] if you need to cancel or reschedule your appointment.   If you are experiencing a Mental Health or [place] or need someone to talk to, please call the [place] National Suicide Prevention Lifeline: 431-649-5934 or TTY: 289-147-6842 TTY 218-802-1451) to talk to a trained counselor call 1-800-273-TALK (toll free, 24 hour hotline) go to [facility] [address] [place] [phone]) call 911   Following is a copy of your full care plan:  Care Plan :  [facility]  Updates made by Kassie Mends, RN since 01/03/2022 12:00 AM     Problem: No plan of care established for management of chronic disease state  (HTN, HLD, memory disorder)   Priority: High     Long-Range Goal: Development of plan of care for chronic disease management  (HTN, HLD, memory disorder)   Start Date: 01/03/2022  Expected End Date: 07/02/2022  Priority: High  Note:   Current Barriers:  Knowledge Deficits related to plan of care for management of HTN, HLD, and memory disorder  Spoke with spouse, Jay Rivas due to patient having memory disorder, per spouse pt has dementia that varies day to day, pt still drives, is independent in all aspects of his care, still mows the yard and gets outside when he can, does not participate in regular exercise program, spouse reports pt is not taking zetia but she is not sure why and will speak with primary care provider about this. Patient has wrist cuff but does not monitor blood pressure, does not eat a special diet. No advanced directives in place- requests information  RNCM Clinical Goal(s):  Patient will verbalize understanding of plan for management of HTN and HLD as evidenced by caregiver report, review of EHR and  through collaboration with RN Care manager, provider, and care team.   Interventions: 1:1 collaboration with primary care provider regarding development and update of comprehensive plan of care as evidenced by provider attestation and co-signature Inter-disciplinary care team collaboration (see longitudinal plan of care) Evaluation of current treatment plan related to  self management  and patient's adherence to plan as established by provider   Hyperlipidemia:  (Status: New goal. Goal on Track (progressing): YES.) Long Term Goal  Lab Results  Component Value Date   CHOL 188 11/04/2019   HDL 60 11/04/2019   LDLCALC 105 (H) 11/04/2019   TRIG 130 11/04/2019   CHOLHDL 3.1 11/04/2019      Medication review performed; medication list updated in electronic medical record.  Provider established cholesterol goals reviewed; Provided HLD educational materials; Reviewed role and benefits of statin for ASCVD risk reduction; Reviewed importance of limiting foods high in cholesterol; Screening for signs and symptoms of depression related to chronic disease state;  Assessed social determinant of health barriers;  Education mailed- heart healthy diet Reviewed importance of taking zetia as prescribed and if there is a reason for not taking it, please discuss with your doctor   Hypertension Interventions:  (Status:  New goal. and Goal on track:  Yes.) Long Term Goal Last practice recorded BP readings:  BP Readings from Last 3 Encounters:  04/30/21 130/82  02/10/20 (!) 144/78  11/04/19 (!) 142/84  Most recent eGFR/CrCl: No results found for: EGFR  No components found for: CRCL  Evaluation of current treatment plan related to hypertension self management and patient's adherence to plan as established by provider Reviewed medications with patient and discussed importance of compliance Discussed plans with patient for ongoing care management follow up and provided patient with direct contact information for care management team Advised patient, providing education and rationale, to monitor blood pressure daily and record, calling PCP for findings outside established parameters  Education mailed- low sodium diet Advanced directives packet mailed  Patient Goals/Self-Care Activities: Take medications as prescribed   Attend all scheduled provider appointments Call pharmacy for medication refills 3-7 days in advance of running out of medications Attend church or other social activities Perform all self care activities independently  Call provider office for new concerns or questions  check blood pressure weekly choose a place to take my blood pressure (home, clinic or office, retail  store) write blood pressure results in a log or diary take blood pressure log to all doctor appointments - call for medicine refill 2 or 3 days before it runs out - take all medications exactly as prescribed - call doctor with any symptoms you believe are related to your medicine - call doctor when you experience any new symptoms - go to all doctor appointments as scheduled - adhere to prescribed diet: heart healthy and low sodium Bake or broil foods instead of frying Talk to your doctor about why you are not taking zetia/ benefits versus risks  Look over education mailed- low sodium and heart healthy diet Look over and complete advanced directives packet mailed       Consent to CCM Services: Jay Rivas was given information about Chronic Care Management services including:  CCM service includes personalized support from designated clinical staff supervised by his physician, including individualized plan of care and coordination with other care providers 24/7 contact phone numbers for assistance for urgent and routine care needs. Service will only be billed when office clinical staff spend 20 minutes or more in a month to coordinate care. Only one practitioner may furnish and bill the service in a calendar month. The patient may stop CCM services at any time (effective at the end of the month) by phone call to the office staff. The patient will be responsible for cost sharing (co-pay) of up to 20% of the service  fee (after annual deductible is met).  Patient agreed to services and verbal consent obtained.   The patient verbalized understanding of instructions, educational materials, and care plan provided today and agreed to receive a mailed copy of patient instructions, educational materials, and care plan.   Telephone follow up appointment with care management team member scheduled for:  03/17/22  Heart-Healthy Eating Plan Heart-healthy meal planning includes: Eating less  unhealthy fats. Eating more healthy fats. Making other changes in your diet. Talk with your doctor or a diet specialist (dietitian) to create an eating plan that is right for you. What is my plan? Your doctor may recommend an eating plan that includes: Total fat: ______% or less of total calories a day. Saturated fat: ______% or less of total calories a day. Cholesterol: less than _________mg a day. What are tips for following this plan? Cooking Avoid frying your food. Try to bake, boil, grill, or broil it instead. You can also reduce fat by: Removing the skin from poultry. Removing all visible fats from meats. Steaming vegetables in water or broth. Meal planning  At meals, divide your plate into four equal parts: Fill one-half of your plate with vegetables and green salads. Fill one-fourth of your plate with whole grains. Fill one-fourth of your plate with lean protein foods. Eat 4-5 servings of vegetables per day. A serving of vegetables is: 1 cup of raw or cooked vegetables. 2 cups of raw leafy greens. Eat 4-5 servings of fruit per day. A serving of fruit is: 1 medium whole fruit.  cup of dried fruit.  cup of fresh, frozen, or canned fruit.  cup of 100% fruit juice. Eat more foods that have soluble fiber. These are apples, broccoli, carrots, beans, peas, and barley. Try to get 20-30 g of fiber per day. Eat 4-5 servings of nuts, legumes, and seeds per week: 1 serving of dried beans or legumes equals  cup after being cooked. 1 serving of nuts is  cup. 1 serving of seeds equals 1 tablespoon. General information Eat more home-cooked food. Eat less restaurant, buffet, and fast food. Limit or avoid alcohol. Limit foods that are high in starch and sugar. Avoid fried foods. Lose weight if you are overweight. Keep track of how much salt (sodium) you eat. This is important if you have high blood pressure. Ask your doctor to tell you more about this. Try to add vegetarian meals  each week. Fats Choose healthy fats. These include olive oil and canola oil, flaxseeds, walnuts, almonds, and seeds. Eat more omega-3 fats. These include salmon, mackerel, sardines, tuna, flaxseed oil, and ground flaxseeds. Try to eat fish at least 2 times each week. Check food labels. Avoid foods with trans fats or high amounts of saturated fat. Limit saturated fats. These are often found in animal products, such as meats, butter, and cream. These are also found in plant foods, such as palm oil, palm kernel oil, and coconut oil. Avoid foods with partially hydrogenated oils in them. These have trans fats. Examples are stick margarine, some tub margarines, cookies, crackers, and other baked goods. What foods can I eat? Fruits All fresh, canned (in natural juice), or frozen fruits. Vegetables Fresh or frozen vegetables (raw, steamed, roasted, or grilled). Green salads. Grains Most grains. Choose whole wheat and whole grains most of the time. Rice and pasta, including brown rice and pastas made with whole wheat. Meats and other proteins Lean, well-trimmed beef, veal, pork, and lamb. Chicken and [place] without skin. All fish and  shellfish. Wild duck, rabbit, pheasant, and venison. Egg whites or low-cholesterol egg substitutes. Dried beans, peas, lentils, and tofu. Seeds and most nuts. Dairy Low-fat or nonfat cheeses, including ricotta and mozzarella. Skim or 1% milk that is liquid, powdered, or evaporated. Buttermilk that is made with low-fat milk. Nonfat or low-fat yogurt. Fats and oils Non-hydrogenated (trans-free) margarines. Vegetable oils, including soybean, sesame, sunflower, olive, peanut, safflower, corn, canola, and cottonseed. Salad dressings or mayonnaise made with a vegetable oil. Beverages Mineral water. Coffee and tea. Diet carbonated beverages. Sweets and desserts Sherbet, gelatin, and fruit ice. Small amounts of dark chocolate. Limit all sweets and desserts. Seasonings and  condiments All seasonings and condiments. The items listed above may not be a complete list of foods and drinks you can eat. Contact a dietitian for more options. What foods should I avoid? Fruits Canned fruit in heavy syrup. Fruit in cream or butter sauce. Fried fruit. Limit coconut. Vegetables Vegetables cooked in cheese, cream, or butter sauce. Fried vegetables. Grains Breads that are made with saturated or trans fats, oils, or whole milk. Croissants. Sweet rolls. Donuts. High-fat crackers, such as cheese crackers. Meats and other proteins Fatty meats, such as hot dogs, ribs, sausage, bacon, rib-eye roast or steak. High-fat deli meats, such as salami and bologna. Caviar. Domestic duck and goose. Organ meats, such as liver. Dairy Cream, sour cream, cream cheese, and creamed cottage cheese. Whole-milk cheeses. Whole or 2% milk that is liquid, evaporated, or condensed. Whole buttermilk. Cream sauce or high-fat cheese sauce. Yogurt that is made from whole milk. Fats and oils Meat fat, or shortening. Cocoa butter, hydrogenated oils, palm oil, coconut oil, palm kernel oil. Solid fats and shortenings, including bacon fat, salt pork, lard, and butter. Nondairy cream substitutes. Salad dressings with cheese or sour cream. Beverages Regular sodas and juice drinks with added sugar. Sweets and desserts Frosting. Pudding. Cookies. Cakes. Pies. Milk chocolate or white chocolate. Buttered syrups. Full-fat ice cream or ice cream drinks. The items listed above may not be a complete list of foods and drinks to avoid. Contact a dietitian for more information. Summary Heart-healthy meal planning includes eating less unhealthy fats, eating more healthy fats, and making other changes in your diet. Eat a balanced diet. This includes fruits and vegetables, low-fat or nonfat dairy, lean protein, nuts and legumes, whole grains, and heart-healthy oils and fats. This information is not intended to replace advice  given to you by your health care provider. Make sure you discuss any questions you have with your health care provider. Document Revised: 02/21/2021 Document Reviewed: 02/21/2021 Elsevier Patient Education  2022 [employer]. Low-Sodium Eating Plan Sodium, which is an element that makes up salt, helps you maintain a healthy balance of fluids in your body. Too much sodium can increase your blood pressure and cause fluid and waste to be held in your body. Your health care provider or dietitian may recommend following this plan if you have high blood pressure (hypertension), kidney disease, liver disease, or heart failure. Eating less sodium can help lower your blood pressure, reduce swelling, and protect your heart, liver, and kidneys. What are tips for following this plan? Reading food labels The Nutrition Facts label lists the amount of sodium in one serving of the food. If you eat more than one serving, you must multiply the listed amount of sodium by the number of servings. Choose foods with less than 140 mg of sodium per serving. Avoid foods with 300 mg of sodium or more per serving.  Shopping  Look for lower-sodium products, often labeled as "low-sodium" or "no salt added." Always check the sodium content, even if foods are labeled as "unsalted" or "no salt added." Buy fresh foods. Avoid canned foods and pre-made or frozen meals. Avoid canned, cured, or processed meats. Buy breads that have less than 80 mg of sodium per slice. Cooking  Eat more home-cooked food and less restaurant, buffet, and fast food. Avoid adding salt when cooking. Use salt-free seasonings or herbs instead of table salt or sea salt. Check with your health care provider or pharmacist before using salt substitutes. Cook with plant-based oils, such as canola, sunflower, or olive oil. Meal planning When eating at a restaurant, ask that your food be prepared with less salt or no salt, if possible. Avoid dishes labeled as  brined, pickled, cured, smoked, or made with soy sauce, miso, or teriyaki sauce. Avoid foods that contain MSG (monosodium glutamate). MSG is sometimes added to [place] food, bouillon, and some canned foods. Make meals that can be grilled, baked, poached, roasted, or steamed. These are generally made with less sodium. General information Most people on this plan should limit their sodium intake to 1,500-2,000 mg (milligrams) of sodium each day. What foods should I eat? Fruits Fresh, frozen, or canned fruit. Fruit juice. Vegetables Fresh or frozen vegetables. "No salt added" canned vegetables. "No salt added" tomato sauce and paste. Low-sodium or reduced-sodium tomato and vegetable juice. Grains Low-sodium cereals, including oats, puffed wheat and rice, and shredded wheat. Low-sodium crackers. Unsalted rice. Unsalted pasta. Low-sodium bread. Whole-grain breads and whole-grain pasta. Meats and other proteins Fresh or frozen (no salt added) meat, poultry, seafood, and fish. Low-sodium canned tuna and salmon. Unsalted nuts. Dried peas, beans, and lentils without added salt. Unsalted canned beans. Eggs. Unsalted nut butters. Dairy Milk. Soy milk. Cheese that is naturally low in sodium, such as ricotta cheese, fresh mozzarella, or Swiss cheese. Low-sodium or reduced-sodium cheese. Cream cheese. Yogurt. Seasonings and condiments Fresh and dried herbs and spices. Salt-free seasonings. Low-sodium mustard and ketchup. Sodium-free salad dressing. Sodium-free light mayonnaise. Fresh or refrigerated horseradish. Lemon juice. Vinegar. Other foods Homemade, reduced-sodium, or low-sodium soups. Unsalted popcorn and pretzels. Low-salt or salt-free chips. The items listed above may not be a complete list of foods and beverages you can eat. Contact a dietitian for more information. What foods should I avoid? Vegetables Sauerkraut, pickled vegetables, and relishes. Olives. [place] fries. Onion rings. Regular canned  vegetables (not low-sodium or reduced-sodium). Regular canned tomato sauce and paste (not low-sodium or reduced-sodium). Regular tomato and vegetable juice (not low-sodium or reduced-sodium). Frozen vegetables in sauces. Grains Instant hot cereals. Bread stuffing, pancake, and biscuit mixes. Croutons. Seasoned rice or pasta mixes. Noodle soup cups. Boxed or frozen macaroni and cheese. Regular salted crackers. Self-rising flour. Meats and other proteins Meat or fish that is salted, canned, smoked, spiced, or pickled. Precooked or cured meat, such as sausages or meat loaves. [place]ce Salines. Ham. Pepperoni. Hot dogs. Corned beef. Chipped beef. Salt pork. Jerky. Pickled herring. Anchovies and sardines. Regular canned tuna. Salted nuts. Dairy Processed cheese and cheese spreads. Hard cheeses. Cheese curds. Blue cheese. Feta cheese. String cheese. Regular cottage cheese. Buttermilk. Canned milk. Fats and oils Salted butter. Regular margarine. Ghee. Bacon fat. Seasonings and condiments Onion salt, garlic salt, seasoned salt, table salt, and sea salt. Canned and packaged gravies. Worcestershire sauce. Tartar sauce. Barbecue sauce. Teriyaki sauce. Soy sauce, including reduced-sodium. Steak sauce. Fish sauce. Oyster sauce. Cocktail sauce. Horseradish that you find on the  shelf. Regular ketchup and mustard. Meat flavorings and tenderizers. Bouillon cubes. Hot sauce. Pre-made or packaged marinades. Pre-made or packaged taco seasonings. Relishes. Regular salad dressings. Salsa. Other foods Salted popcorn and pretzels. Corn chips and puffs. Potato and tortilla chips. Canned or dried soups. Pizza. Frozen entrees and pot pies. The items listed above may not be a complete list of foods and beverages you should avoid. Contact a dietitian for more information. Summary Eating less sodium can help lower your blood pressure, reduce swelling, and protect your heart, liver, and kidneys. Most people on this plan should limit their  sodium intake to 1,500-2,000 mg (milligrams) of sodium each day. Canned, boxed, and frozen foods are high in sodium. Restaurant foods, fast foods, and pizza are also very high in sodium. You also get sodium by adding salt to food. Try to cook at home, eat more fresh fruits and vegetables, and eat less fast food and canned, processed, or prepared foods. This information is not intended to replace advice given to you by your health care provider. Make sure you discuss any questions you have with your health care provider. Document Revised: 11/18/2019 Document Reviewed: 09/14/2019 Elsevier Patient Education  2022 [employer].

## 2022-01-09 ENCOUNTER — Ambulatory Visit (INDEPENDENT_AMBULATORY_CARE_PROVIDER_SITE_OTHER): Payer: Medicare Other | Admitting: Family Medicine

## 2022-01-09 ENCOUNTER — Other Ambulatory Visit: Payer: Self-pay

## 2022-01-09 ENCOUNTER — Encounter: Payer: Self-pay | Admitting: Family Medicine

## 2022-01-09 VITALS — BP 128/82 | HR 67 | Temp 97.9°F | Resp 18 | Ht 68.0 in | Wt 158.0 lb

## 2022-01-09 DIAGNOSIS — D509 Iron deficiency anemia, unspecified: Secondary | ICD-10-CM | POA: Diagnosis not present

## 2022-01-09 DIAGNOSIS — G629 Polyneuropathy, unspecified: Secondary | ICD-10-CM | POA: Diagnosis not present

## 2022-01-09 DIAGNOSIS — R6889 Other general symptoms and signs: Secondary | ICD-10-CM | POA: Diagnosis not present

## 2022-01-09 DIAGNOSIS — G3185 Corticobasal degeneration: Secondary | ICD-10-CM | POA: Diagnosis not present

## 2022-01-09 MED ORDER — CARBIDOPA-LEVODOPA 25-250 MG PO TABS
1.0000 | ORAL_TABLET | Freq: Three times a day (TID) | ORAL | 1 refills | Status: DC
Start: 2022-01-09 — End: 2022-02-04

## 2022-01-09 NOTE — Progress Notes (Signed)
? ?Subjective:  ? ? Patient ID: Jay Rivas, male    DOB: Nov 25, 1950, 71 y.o.   MRN: [id number] ? ?Medication Refill ?  ? ? ?01/09/22 ?As mentioned at his physical exam last year, the patient has seen neurology at [facility] and been diagnosed with corticobasal degeneration.  We discussed possibly trying Sinemet for the pill-rolling tremor and cogwheel rigidity in his right arm as well as some of the bradykinesia.  We discussed the benefit at best is modest and only temporary.  At that time, he declined trying the medication.  However, the tremor is getting worse making it difficult for him to do certain everyday tasks.  He is now interested in trying the medication.  He is also having word finding difficulties.  He states that he knows what he wants to say but he has a hard time making his body and now speak the words.  Memory loss seems to be stable.  He denies any falls.  He is still very active and driving.  He denies any chest pain or shortness of breath or dyspnea on exertion. ? ? ?Past Medical History:  ?Diagnosis Date  ? Carpal tunnel syndrome on both sides   ? Dementia ([place])   ? diagnosed in jan 2019, pt states medication seems to be helping, A&Ox4  ? HLD (hyperlipidemia)   ? Hypertension   ? Memory disorder 11/24/2017  ? Myocardial infarction [facility])   ? Dr. Terrence Dupont  ? Spinal stenosis of cervical region   ? ?Past Surgical History:  ?Procedure Laterality Date  ? ABDOMINAL SURGERY    ? CARDIAC CATHETERIZATION  2005  ? stent  ? CARPAL TUNNEL RELEASE Right   ? CARPAL TUNNEL RELEASE Right 03/09/2018  ? Procedure: RIGHT CARPAL TUNNEL RELEASE;  Surgeon: Daryll Brod, MD;  Location: [facility];  Service: Orthopedics;  Laterality: Right;  ? CARPAL TUNNEL RELEASE Left 11/02/2018  ? Procedure: LEFT CARPAL TUNNEL RELEASE;  Surgeon: Daryll Brod, MD;  Location: [place];  Service: Orthopedics;  Laterality: Left;  ? TENDON TRANSFER Left 11/02/2018  ? Procedure: LEFT CAMITZ TRANSFER;  Surgeon: Daryll Brod, MD;  Location: [place];  Service: Orthopedics;  Laterality: Left;  ? TRIGGER FINGER RELEASE Right 03/09/2018  ? Procedure: RELEASE TRIGGER FINGER/A-1 PULLEY RIGHT MIDDLE, RING AND SMALL FINGERS;  Surgeon: Daryll Brod, MD;  Location: [place];  Service: Orthopedics;  Laterality: Right;  ? ULNAR NERVE TRANSPOSITION Left 11/02/2018  ? Procedure: LEFT ULNAR NERVE DECOMPRESSION;  Surgeon: Daryll Brod, MD;  Location: [place];  Service: Orthopedics;  Laterality: Left;  ? ULNAR TUNNEL RELEASE Right 03/09/2018  ? Procedure: CUBITAL TUNNEL DECOMPRESSION RIGHT ELBOW;  Surgeon: Daryll Brod, MD;  Location: [place];  Service: Orthopedics;  Laterality: Right;  ? ?Current Outpatient Medications on File Prior to Visit  ?Medication Sig Dispense Refill  ? aspirin EC 81 MG tablet Take 81 mg by mouth daily.    ? Coconut Oil 1000 MG CAPS Take by mouth. (Patient not taking: Reported on 01/03/2022)    ? Coenzyme Q10 (COQ-10) 200 MG CAPS Take by mouth.    ? donepezil (ARICEPT) 10 MG tablet TAKE 1 TABLET BY MOUTH EVERYDAY AT BEDTIME 90 tablet 3  ? ezetimibe (ZETIA) 10 MG tablet Take 10 mg by mouth daily. (Patient not taking: Reported on 01/03/2022)    ? losartan (COZAAR) 100 MG tablet Take 100 mg by mouth daily.    ? Omega-3 Fatty Acids (  FISH OIL) 1000 MG CAPS Take by mouth.    ? ?No current facility-administered medications on file prior to visit.  ? ?Allergies  ?Allergen Reactions  ? Penicillin G Anaphylaxis  ? ?Social History  ? ?Socioeconomic History  ? Marital status: Married  ?  Spouse name: Evelena Peat  ? Number of children: 2  ? Years of education: Not on file  ? Highest education level: Not on file  ?Occupational History  ? Not on file  ?Tobacco Use  ? Smoking status: Former  ? Smokeless tobacco: Former  ? Tobacco comments:  ?  quit 1990  ?Substance and Sexual Activity  ? Alcohol use: Yes  ?  Alcohol/week: 24.0 standard drinks  ?  Types: 24 Cans of beer per week  ?   Comment: social beer only  ? Drug use: No  ? Sexual activity: Yes  ?Other Topics Concern  ? Not on file  ?Social History Narrative  ? Lives with wife, Evelena Peat.   ? 1 son and 1 daughter.  ? 2 grandchildren.  ? Right handed   ? ?Social Determinants of Health  ? ?Financial Resource Strain: Low Risk   ? Difficulty of Paying Living Expenses: Not hard at all  ?Food Insecurity: No Food Insecurity  ? Worried About Charity fundraiser in the Last Year: Never true  ? Ran Out of Food in the Last Year: Never true  ?Transportation Needs: No Transportation Needs  ? Lack of Transportation (Medical): No  ? Lack of Transportation (Non-Medical): No  ?Physical Activity: Insufficiently Active  ? Days of Exercise per Week: 3 days  ? Minutes of Exercise per Session: 20 min  ?Stress: No Stress Concern Present  ? Feeling of Stress : Not at all  ?Social Connections: Socially Integrated  ? Frequency of Communication with Friends and Family: More than three times a week  ? Frequency of Social Gatherings with Friends and Family: More than three times a week  ? Attends Religious Services: 1 to 4 times per year  ? Active Member of Clubs or Organizations: Yes  ? Attends Archivist Meetings: 1 to 4 times per year  ? Marital Status: Married  ?Intimate Partner Violence: Not At Risk  ? Fear of Current or Ex-Partner: No  ? Emotionally Abused: No  ? Physically Abused: No  ? Sexually Abused: No  ? ?No family history on file. ? ? ? ? ?Review of Systems  ?All other systems reviewed and are negative. ? ?   ?Objective:  ? Physical Exam ?Vitals reviewed.  ?Constitutional:   ?   General: He is not in acute distress. ?   Appearance: He is well-developed. He is not diaphoretic.  ?HENT:  ?   Head: Normocephalic and atraumatic.  ?   Right Ear: External ear normal.  ?   Left Ear: External ear normal.  ?   Nose: Nose normal.  ?   Mouth/Throat:  ?   Pharynx: No oropharyngeal exudate.  ?Eyes:  ?   General: No scleral icterus.    ?   Right eye: No discharge.      ?   Left eye: No discharge.  ?   Conjunctiva/sclera: Conjunctivae normal.  ?   Pupils: Pupils are equal, round, and reactive to light.  ?Neck:  ?   Thyroid: No thyromegaly.  ?   Vascular: No JVD.  ?Cardiovascular:  ?   Rate and Rhythm: Normal rate and regular rhythm.  ?   Heart sounds: Normal heart sounds.  No murmur heard. ?  No friction rub. No gallop.  ?Pulmonary:  ?   Effort: Pulmonary effort is normal. No respiratory distress.  ?   Breath sounds: Normal breath sounds. No wheezing or rales.  ?Chest:  ?   Chest wall: No tenderness.  ?Abdominal:  ?   General: Bowel sounds are normal. There is no distension.  ?   Palpations: Abdomen is soft. There is no mass.  ?   Tenderness: There is no abdominal tenderness. There is no guarding or rebound.  ?Musculoskeletal:  ?   Cervical back: Normal range of motion and neck supple.  ?Lymphadenopathy:  ?   Cervical: No cervical adenopathy.  ?Neurological:  ?   Mental Status: He is alert and oriented to person, place, and time.  ?   Cranial Nerves: No cranial nerve deficit.  ?   Deep Tendon Reflexes: Reflexes are normal and symmetric.  ?Psychiatric:     ?   Behavior: Behavior normal.     ?   Thought Content: Thought content normal.     ?   Judgment: Judgment normal.  ? ? ?Patient has cogwheel rigidity in the right upper extremity with a pill-rolling tremor.  He also demonstrates some hyperreflexia in the right lower extremity with ankle jerk.  Otherwise his neurologic exam is normal.  He does have some diminished sensation to 10 g monofilament in both feet. ? ? ?   ?Assessment & Plan:  ?Neuropathy - Plan: CBC with Differential/Platelet, Iron, TIBC and Ferritin Panel ? 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  For his neuropathy they would like to check an iron panel along with a CBC.  Recent CMP and fasting lipid panel was normal. ?

## 2022-01-10 LAB — CBC WITH DIFFERENTIAL/PLATELET
Absolute Monocytes: 320 cells/uL (ref 200–950)
Basophils Absolute: 50 cells/uL (ref 0–200)
Basophils Relative: 1.1 %
Eosinophils Absolute: 248 cells/uL (ref 15–500)
Eosinophils Relative: 5.5 %
HCT: 37 % — ABNORMAL LOW (ref 38.5–50.0)
Hemoglobin: 12.7 g/dL — ABNORMAL LOW (ref 13.2–17.1)
Lymphs Abs: 887 cells/uL (ref 850–3900)
MCH: 31 pg (ref 27.0–33.0)
MCHC: 34.3 g/dL (ref 32.0–36.0)
MCV: 90.2 fL (ref 80.0–100.0)
MPV: 9.9 fL (ref 7.5–12.5)
Monocytes Relative: 7.1 %
Neutro Abs: 2997 cells/uL (ref 1500–7800)
Neutrophils Relative %: 66.6 %
Platelets: 202 10*3/uL (ref 140–400)
RBC: 4.1 10*6/uL — ABNORMAL LOW (ref 4.20–5.80)
RDW: 13.1 % (ref 11.0–15.0)
Total Lymphocyte: 19.7 %
WBC: 4.5 10*3/uL (ref 3.8–10.8)

## 2022-01-10 LAB — IRON,TIBC AND FERRITIN PANEL
%SAT: 29 % (calc) (ref 20–48)
Ferritin: 48 ng/mL (ref 24–380)
Iron: 96 ug/dL (ref 50–180)
TIBC: 330 mcg/dL (calc) (ref 250–425)

## 2022-01-24 DIAGNOSIS — I10 Essential (primary) hypertension: Secondary | ICD-10-CM

## 2022-01-24 DIAGNOSIS — E785 Hyperlipidemia, unspecified: Secondary | ICD-10-CM | POA: Diagnosis not present

## 2022-02-04 ENCOUNTER — Other Ambulatory Visit: Payer: Self-pay | Admitting: Family Medicine

## 2022-02-28 DIAGNOSIS — E785 Hyperlipidemia, unspecified: Secondary | ICD-10-CM | POA: Diagnosis not present

## 2022-02-28 DIAGNOSIS — I2510 Atherosclerotic heart disease of native coronary artery without angina pectoris: Secondary | ICD-10-CM | POA: Diagnosis not present

## 2022-02-28 DIAGNOSIS — I10 Essential (primary) hypertension: Secondary | ICD-10-CM | POA: Diagnosis not present

## 2022-02-28 DIAGNOSIS — R7303 Prediabetes: Secondary | ICD-10-CM | POA: Diagnosis not present

## 2022-03-05 DIAGNOSIS — G253 Myoclonus: Secondary | ICD-10-CM | POA: Diagnosis not present

## 2022-03-05 DIAGNOSIS — F0390 Unspecified dementia without behavioral disturbance: Secondary | ICD-10-CM | POA: Diagnosis not present

## 2022-03-17 ENCOUNTER — Ambulatory Visit (INDEPENDENT_AMBULATORY_CARE_PROVIDER_SITE_OTHER): Payer: Medicare Other | Admitting: *Deleted

## 2022-03-17 ENCOUNTER — Telehealth: Payer: Self-pay

## 2022-03-17 DIAGNOSIS — E785 Hyperlipidemia, unspecified: Secondary | ICD-10-CM

## 2022-03-17 DIAGNOSIS — I10 Essential (primary) hypertension: Secondary | ICD-10-CM

## 2022-03-17 NOTE — Telephone Encounter (Signed)
Pt's wife, called stated that the Carbidopa-levodopa, is making pt feel disoriented and also if affecting him mentally. Pt's wife stated that they had stop taking the medicine.

## 2022-03-17 NOTE — Chronic Care Management (AMB) (Signed)
Chronic Care Management   CCM RN Visit Note  03/17/2022 Name: Jay Rivas MRN: [id number] DOB: 06-26-1951  Subjective: Jay Rivas is a 71 y.o. year old male who is a primary care patient of Pickard, Cammie Mcgee, MD. The care management team was consulted for assistance with disease management and care coordination needs.    Engaged with patient by telephone for follow up visit in response to provider referral for case management and/or care coordination services.   Consent to Services:  The patient was given information about Chronic Care Management services, agreed to services, and gave verbal consent prior to initiation of services.  Please see initial visit note for detailed documentation.   Patient agreed to services and verbal consent obtained.   Assessment: Review of patient past medical history, allergies, medications, health status, including review of consultants reports, laboratory and other test data, was performed as part of comprehensive evaluation and provision of chronic care management services.   SDOH (Social Determinants of Health) assessments and interventions performed:    CCM Care Plan  Allergies  Allergen Reactions   Penicillin G Anaphylaxis    Outpatient Encounter Medications as of 03/17/2022  Medication Sig   aspirin EC 81 MG tablet Take 81 mg by mouth daily.   Coconut Oil 1000 MG CAPS Take by mouth.   Coenzyme Q10 (COQ-10) 200 MG CAPS Take by mouth.   donepezil (ARICEPT) 10 MG tablet TAKE 1 TABLET BY MOUTH EVERYDAY AT BEDTIME   hydrocortisone 2.5 % lotion Apply topically.   ketoconazole (NIZORAL) 2 % shampoo SMARTSIG:Topical 2-3 Times Weekly   losartan (COZAAR) 100 MG tablet Take 100 mg by mouth daily.   Omega-3 Fatty Acids (FISH OIL) 1000 MG CAPS Take by mouth.   carbidopa-levodopa (SINEMET IR) 25-250 MG tablet TAKE 1 TABLET BY MOUTH THREE TIMES A DAY (Patient not taking: Reported on 03/17/2022)   ezetimibe (ZETIA) 10 MG tablet Take 10 mg by mouth  daily. (Patient not taking: Reported on 01/09/2022)   No facility-administered encounter medications on file as of 03/17/2022.    Patient Active Problem List   Diagnosis Date Noted   HLD (hyperlipidemia)    Memory disorder 11/24/2017   Hypertension 09/06/2014    Conditions to be addressed/monitored:HTN and HLD  Care Plan : RN Care Manager Plan of Care  Updates made by Kassie Mends, RN since 03/17/2022 12:00 AM     Problem: No plan of care established for management of chronic disease state  (HTN, HLD, memory disorder)   Priority: High     Long-Range Goal: Development of plan of care for chronic disease management  (HTN, HLD, memory disorder)   Start Date: 01/03/2022  Expected End Date: 07/02/2022  Priority: High  Note:   Current Barriers:  Knowledge Deficits related to plan of care for management of HTN, HLD, and memory disorder  Spoke with spouse, Jay Rivas due to patient having memory disorder, per spouse pt has dementia that varies day to day, pt still drives (around town, not in heavy traffic), is independent in all aspects of his care, still mows the yard and gets outside when he can, does not participate in regular exercise program, spouse reports pt is is still not taking zetia, has decided not to take because of side effects, reports patient was prescribed medication "for Parkinson's and thought might help but he can't take it, doesn't like the side effects" Patient has wrist cuff but does not monitor blood pressure, wife states they can start checking,  does not eat a special diet. No advanced directives in place- packet was mailed  [place]):  Patient will verbalize understanding of plan for management of HTN and HLD as evidenced by caregiver report, review of EHR and  through collaboration with RN Care manager, provider, and care team.   Interventions: 1:1 collaboration with primary care provider regarding development and update of comprehensive plan of  care as evidenced by provider attestation and co-signature Inter-disciplinary care team collaboration (see longitudinal plan of care) Evaluation of current treatment plan related to  self management and patient's adherence to plan as established by provider   Hyperlipidemia:  (Status: New goal. Goal on Track (progressing): YES.) Long Term Goal  Lab Results  Component Value Date   CHOL 188 11/04/2019   HDL 60 11/04/2019   LDLCALC 105 (H) 11/04/2019   TRIG 130 11/04/2019   CHOLHDL 3.1 11/04/2019     Medication review performed; medication list updated in electronic medical record.  Provider established cholesterol goals reviewed; Reviewed role and benefits of statin for ASCVD risk reduction; Reviewed importance of limiting foods high in cholesterol; Reinforced importance of following a heart healthy diet   Hypertension Interventions:  (Status:  New goal. and Goal on track:  Yes.) Long Term Goal Last practice recorded BP readings:  BP Readings from Last 3 Encounters:  04/30/21 130/82  02/10/20 (!) 144/78  11/04/19 (!) 142/84  Most recent eGFR/CrCl: No results found for: EGFR  No components found for: CRCL  Reviewed medications with patient and discussed importance of compliance Discussed plans with patient for ongoing care management follow up and provided patient with direct contact information for care management team Advised patient, providing education and rationale, to monitor blood pressure daily and record, calling PCP for findings outside established parameters  Reviewed importance of following low sodium diet and limiting fast food Reviewed importance of getting outside daily and walking  Reinforced tips for everyday care of patients with memory disorder  Patient Goals/Self-Care Activities: Take medications as prescribed   Attend all scheduled provider appointments Call pharmacy for medication refills 3-7 days in advance of running out of medications Attend church or  other social activities Perform all self care activities independently  Call provider office for new concerns or questions  check blood pressure weekly choose a place to take my blood pressure (home, clinic or office, retail store) write blood pressure results in a log or diary take blood pressure log to all doctor appointments eat more whole grains, fruits and vegetables, lean meats and healthy fats - call for medicine refill 2 or 3 days before it runs out - take all medications exactly as prescribed - call doctor with any symptoms you believe are related to your medicine - call doctor when you experience any new symptoms - go to all doctor appointments as scheduled - adhere to prescribed diet: heart healthy and low sodium Bake or broil foods instead of frying Always talk to your doctor about any side effects you are having with any medications Try to get outside daily and walk Keep a routine, such as bathing and dressing, eating at the same time each day, plan activities the person enjoys and try to do them the same time each day       Plan:Telephone follow up appointment with care management team member scheduled for:  06/09/22  Jacqlyn Larsen [facility], BSN RN Case Manager [facility] [phone]

## 2022-03-17 NOTE — Patient Instructions (Signed)
Visit Information  Thank you for taking time to visit with me today. Please don't hesitate to contact me if I can be of assistance to you before our next scheduled telephone appointment.  Following are the goals we discussed today:  Take medications as prescribed   Attend all scheduled provider appointments Call pharmacy for medication refills 3-7 days in advance of running out of medications Attend church or other social activities Perform all self care activities independently  Call provider office for new concerns or questions  check blood pressure weekly choose a place to take my blood pressure (home, clinic or office, retail store) write blood pressure results in a log or diary take blood pressure log to all doctor appointments eat more whole grains, fruits and vegetables, lean meats and healthy fats call for medicine refill 2 or 3 days before it runs out take all medications exactly as prescribed call doctor with any symptoms you believe are related to your medicine call doctor when you experience any new symptoms go to all doctor appointments as scheduled adhere to prescribed diet: heart healthy and low sodium Bake or broil foods instead of frying Always talk to your doctor about any side effects you are having with any medications Try to get outside daily and walk Keep a routine, such as bathing and dressing, eating at the same time each day, plan activities the person enjoys and try to do them the same time each day  Our next appointment is by telephone on 06/09/22 at 9 am  Please call the care guide team at [phone] if you need to cancel or reschedule your appointment.   If you are experiencing a Mental Health or [place] or need someone to talk to, please call the Suicide and Crisis Lifeline: 988 call the [place] National Suicide Prevention Lifeline: (463) 044-2720 or TTY: 2124212064 TTY [phone]) to talk to a trained counselor call 1-800-273-TALK  (toll free, 24 hour hotline) go to [facility] [address], [place] [phone]) call 911   The patient verbalized understanding of instructions, educational materials, and care plan provided today and DECLINED offer to receive copy of patient instructions, educational materials, and care plan.   Jacqlyn Larsen RNC, BSN RN Case Manager [facility] [phone]

## 2022-03-18 NOTE — Telephone Encounter (Signed)
error 

## 2022-03-25 DIAGNOSIS — F0390 Unspecified dementia without behavioral disturbance: Secondary | ICD-10-CM | POA: Diagnosis not present

## 2022-03-25 DIAGNOSIS — G253 Myoclonus: Secondary | ICD-10-CM | POA: Diagnosis not present

## 2022-03-26 DIAGNOSIS — E785 Hyperlipidemia, unspecified: Secondary | ICD-10-CM

## 2022-03-26 DIAGNOSIS — I10 Essential (primary) hypertension: Secondary | ICD-10-CM

## 2022-03-26 DIAGNOSIS — Z87891 Personal history of nicotine dependence: Secondary | ICD-10-CM

## 2022-05-30 DIAGNOSIS — I10 Essential (primary) hypertension: Secondary | ICD-10-CM | POA: Diagnosis not present

## 2022-05-30 DIAGNOSIS — I2510 Atherosclerotic heart disease of native coronary artery without angina pectoris: Secondary | ICD-10-CM | POA: Diagnosis not present

## 2022-05-30 DIAGNOSIS — R7303 Prediabetes: Secondary | ICD-10-CM | POA: Diagnosis not present

## 2022-05-30 DIAGNOSIS — E785 Hyperlipidemia, unspecified: Secondary | ICD-10-CM | POA: Diagnosis not present

## 2022-06-09 ENCOUNTER — Ambulatory Visit (INDEPENDENT_AMBULATORY_CARE_PROVIDER_SITE_OTHER): Payer: Medicare Other | Admitting: *Deleted

## 2022-06-09 DIAGNOSIS — I10 Essential (primary) hypertension: Secondary | ICD-10-CM

## 2022-06-09 DIAGNOSIS — E785 Hyperlipidemia, unspecified: Secondary | ICD-10-CM

## 2022-06-09 NOTE — Chronic Care Management (AMB) (Signed)
Chronic Care Management   CCM RN Visit Note  06/09/2022 Name: Jay Rivas MRN: [id number] DOB: 05-16-1951  Subjective: Jay Rivas is a 71 y.o. year old male who is a primary care patient of Pickard, Cammie Mcgee, MD. The care management team was consulted for assistance with disease management and care coordination needs.    Engaged with patient by telephone for follow up visit in response to provider referral for case management and/or care coordination services.   Consent to Services:  The patient was given information about Chronic Care Management services, agreed to services, and gave verbal consent prior to initiation of services.  Please see initial visit note for detailed documentation.   Patient agreed to services and verbal consent obtained.   Assessment: Review of patient past medical history, allergies, medications, health status, including review of consultants reports, laboratory and other test data, was performed as part of comprehensive evaluation and provision of chronic care management services.   SDOH (Social Determinants of Health) assessments and interventions performed:    CCM Care Plan  Allergies  Allergen Reactions   Penicillin G Anaphylaxis    Outpatient Encounter Medications as of 06/09/2022  Medication Sig   aspirin EC 81 MG tablet Take 81 mg by mouth daily.   Coconut Oil 1000 MG CAPS Take by mouth.   Coenzyme Q10 (COQ-10) 200 MG CAPS Take by mouth.   donepezil (ARICEPT) 10 MG tablet TAKE 1 TABLET BY MOUTH EVERYDAY AT BEDTIME   hydrocortisone 2.5 % lotion Apply topically.   ketoconazole (NIZORAL) 2 % shampoo SMARTSIG:Topical 2-3 Times Weekly   losartan (COZAAR) 100 MG tablet Take 100 mg by mouth daily.   Omega-3 Fatty Acids (FISH OIL) 1000 MG CAPS Take by mouth.   carbidopa-levodopa (SINEMET IR) 25-250 MG tablet TAKE 1 TABLET BY MOUTH THREE TIMES A DAY (Patient not taking: Reported on 03/17/2022)   ezetimibe (ZETIA) 10 MG tablet Take 10 mg by mouth  daily. (Patient not taking: Reported on 01/09/2022)   No facility-administered encounter medications on file as of 06/09/2022.    Patient Active Problem List   Diagnosis Date Noted   HLD (hyperlipidemia)    Memory disorder 11/24/2017   Hypertension 09/06/2014    Conditions to be addressed/monitored:HTN and HLD  Care Plan : RN Care Manager Plan of Care  Updates made by Kassie Mends, RN since 06/09/2022 12:00 AM     Problem: No plan of care established for management of chronic disease state  (HTN, HLD, memory disorder)   Priority: High     Long-Range Goal: Development of plan of care for chronic disease management  (HTN, HLD, memory disorder)   Start Date: 01/03/2022  Expected End Date: 07/02/2022  Priority: High  Note:   Current Barriers:  Knowledge Deficits related to plan of care for management of HTN, HLD, and memory disorder  Spoke with spouse, Jay Rivas due to patient having memory disorder, per spouse pt has dementia that varies day to day, pt still drives (around town, not in heavy traffic), is independent in all aspects of his care, still mows the yard and gets outside when he can, does not participate in regular exercise program, spouse reports pt is is still not taking zetia, has decided not to take because of side effects, reports patient was prescribed medication "for Parkinson's and thought might help but he can't take it, doesn't like the side effects" Patient has wrist cuff but does not monitor blood pressure, wife states they can start checking,  does not eat a special diet. No advanced directives in place- packet was mailed but has not completed Spoke with patient's spouse Jay Rivas who reports patient's memory issues "are about the same", no recent changes, no new concerns reported.  RNCM Clinical Goal(s):  Patient will verbalize understanding of plan for management of HTN and HLD as evidenced by caregiver report, review of EHR and  through collaboration with  RN Care manager, provider, and care team.   Interventions: 1:1 collaboration with primary care provider regarding development and update of comprehensive plan of care as evidenced by provider attestation and co-signature Inter-disciplinary care team collaboration (see longitudinal plan of care) Evaluation of current treatment plan related to  self management and patient's adherence to plan as established by provider   Hyperlipidemia:  (Status: New goal. Goal on Track (progressing): YES.) Long Term Goal  Lab Results  Component Value Date   CHOL 188 11/04/2019   HDL 60 11/04/2019   LDLCALC 105 (H) 11/04/2019   TRIG 130 11/04/2019   CHOLHDL 3.1 11/04/2019     Medication review performed; medication list updated in electronic medical record.  Provider established cholesterol goals reviewed; Reviewed role and benefits of statin for ASCVD risk reduction; Reviewed importance of limiting foods high in cholesterol; Reviewed importance of following a heart healthy diet Reviewed safety precautions   Hypertension Interventions:  (Status:  New goal. and Goal on track:  Yes.) Long Term Goal Last practice recorded BP readings:  BP Readings from Last 3 Encounters:  04/30/21 130/82  02/10/20 (!) 144/78  11/04/19 (!) 142/84  Most recent eGFR/CrCl: No results found for: EGFR  No components found for: CRCL  Reviewed medications with patient and discussed importance of compliance Discussed plans with patient for ongoing care management follow up and provided patient with direct contact information for care management team Advised patient, providing education and rationale, to monitor blood pressure daily and record, calling PCP for findings outside established parameters Discussed complications of poorly controlled blood pressure such as heart disease, stroke, circulatory complications, vision complications, kidney impairment, sexual dysfunction  Reinforced importance of following low sodium diet  and limiting fast food Reinforced importance of getting outside daily and walking  Reviewed tips for everyday care of patients with memory disorder Reviewed plan of care with patient's wife including case closure today  Patient Goals/Self-Care Activities: Take medications as prescribed   Attend all scheduled provider appointments Call pharmacy for medication refills 3-7 days in advance of running out of medications Attend church or other social activities Perform all self care activities independently  Call provider office for new concerns or questions  check blood pressure weekly choose a place to take my blood pressure (home, clinic or office, retail store) write blood pressure results in a log or diary take blood pressure log to all doctor appointments report new symptoms to your doctor eat more whole grains, fruits and vegetables, lean meats and healthy fats - call for medicine refill 2 or 3 days before it runs out - take all medications exactly as prescribed - call doctor with any symptoms you believe are related to your medicine - call doctor when you experience any new symptoms - go to all doctor appointments as scheduled - adhere to prescribed diet: heart healthy and low sodium Bake or broil foods instead of frying Always talk to your doctor about any side effects you are having with any medications Try to get outside daily and walk Keep a routine, such as bathing and dressing, eating  at the same time each day, plan activities the person enjoys and try to do them the same time each day Please be careful with walking and always ask for assistance as needed Case closure today, please let your doctor know if you have any further needs for care management       Plan:No further follow up required: case closure today  Jacqlyn Larsen [facility], BSN RN Case Manager [place] [phone]

## 2022-06-09 NOTE — Patient Instructions (Signed)
Visit Information  Thank you for taking time to visit with me today. Please don't hesitate to contact me if I can be of assistance to you before our next scheduled telephone appointment.  Following are the goals we discussed today:  Take medications as prescribed   Attend all scheduled provider appointments Call pharmacy for medication refills 3-7 days in advance of running out of medications Attend church or other social activities Perform all self care activities independently  Call provider office for new concerns or questions  check blood pressure weekly choose a place to take my blood pressure (home, clinic or office, retail store) write blood pressure results in a log or diary take blood pressure log to all doctor appointments report new symptoms to your doctor eat more whole grains, fruits and vegetables, lean meats and healthy fats - call for medicine refill 2 or 3 days before it runs out - take all medications exactly as prescribed - call doctor with any symptoms you believe are related to your medicine - call doctor when you experience any new symptoms - go to all doctor appointments as scheduled - adhere to prescribed diet: heart healthy and low sodium Bake or broil foods instead of frying Always talk to your doctor about any side effects you are having with any medications Try to get outside daily and walk Keep a routine, such as bathing and dressing, eating at the same time each day, plan activities the person enjoys and try to do them the same time each day Please be careful with walking and always ask for assistance as needed Case closure today, please let your doctor know if you have any further needs for care management   Please call the care guide team at [phone] if you need to cancel or reschedule your appointment.   If you are experiencing a Mental Health or [place] or need someone to talk to, please call the Suicide and Crisis Lifeline:  988 call the [place] National Suicide Prevention Lifeline: 4424115065 or TTY: 845-087-6956 TTY 917 187 7242) to talk to a trained counselor call 1-800-273-TALK (toll free, 24 hour hotline) go to [facility] [address] [place] [phone]) call 911   The patient verbalized understanding of instructions, educational materials, and care plan provided today and DECLINED offer to receive copy of patient instructions, educational materials, and care plan.   Jacqlyn Larsen RNC, BSN RN Case Manager [facility] [phone]

## 2022-06-26 DIAGNOSIS — E785 Hyperlipidemia, unspecified: Secondary | ICD-10-CM | POA: Diagnosis not present

## 2022-06-26 DIAGNOSIS — I10 Essential (primary) hypertension: Secondary | ICD-10-CM

## 2022-07-02 DIAGNOSIS — R482 Apraxia: Secondary | ICD-10-CM | POA: Diagnosis not present

## 2022-07-02 DIAGNOSIS — G3185 Corticobasal degeneration: Secondary | ICD-10-CM | POA: Diagnosis not present

## 2022-07-02 DIAGNOSIS — G5603 Carpal tunnel syndrome, bilateral upper limbs: Secondary | ICD-10-CM | POA: Diagnosis not present

## 2022-07-02 DIAGNOSIS — F0280 Dementia in other diseases classified elsewhere without behavioral disturbance: Secondary | ICD-10-CM | POA: Diagnosis not present

## 2022-07-02 DIAGNOSIS — G253 Myoclonus: Secondary | ICD-10-CM | POA: Diagnosis not present

## 2022-07-07 DIAGNOSIS — R41841 Cognitive communication deficit: Secondary | ICD-10-CM | POA: Diagnosis not present

## 2022-07-07 DIAGNOSIS — Z7982 Long term (current) use of aspirin: Secondary | ICD-10-CM | POA: Diagnosis not present

## 2022-07-07 DIAGNOSIS — G3185 Corticobasal degeneration: Secondary | ICD-10-CM | POA: Diagnosis not present

## 2022-07-07 DIAGNOSIS — G253 Myoclonus: Secondary | ICD-10-CM | POA: Diagnosis not present

## 2022-07-07 DIAGNOSIS — Z955 Presence of coronary angioplasty implant and graft: Secondary | ICD-10-CM | POA: Diagnosis not present

## 2022-07-07 DIAGNOSIS — F0284 Dementia in other diseases classified elsewhere, unspecified severity, with anxiety: Secondary | ICD-10-CM | POA: Diagnosis not present

## 2022-07-07 DIAGNOSIS — R419 Unspecified symptoms and signs involving cognitive functions and awareness: Secondary | ICD-10-CM | POA: Diagnosis not present

## 2022-07-07 DIAGNOSIS — I10 Essential (primary) hypertension: Secondary | ICD-10-CM | POA: Diagnosis not present

## 2022-07-07 DIAGNOSIS — G629 Polyneuropathy, unspecified: Secondary | ICD-10-CM | POA: Diagnosis not present

## 2022-07-07 DIAGNOSIS — F02811 Dementia in other diseases classified elsewhere, unspecified severity, with agitation: Secondary | ICD-10-CM | POA: Diagnosis not present

## 2022-07-07 DIAGNOSIS — Z87891 Personal history of nicotine dependence: Secondary | ICD-10-CM | POA: Diagnosis not present

## 2022-07-09 DIAGNOSIS — F02811 Dementia in other diseases classified elsewhere, unspecified severity, with agitation: Secondary | ICD-10-CM | POA: Diagnosis not present

## 2022-07-09 DIAGNOSIS — F0284 Dementia in other diseases classified elsewhere, unspecified severity, with anxiety: Secondary | ICD-10-CM | POA: Diagnosis not present

## 2022-07-09 DIAGNOSIS — G253 Myoclonus: Secondary | ICD-10-CM | POA: Diagnosis not present

## 2022-07-09 DIAGNOSIS — I10 Essential (primary) hypertension: Secondary | ICD-10-CM | POA: Diagnosis not present

## 2022-07-09 DIAGNOSIS — G3185 Corticobasal degeneration: Secondary | ICD-10-CM | POA: Diagnosis not present

## 2022-07-09 DIAGNOSIS — G629 Polyneuropathy, unspecified: Secondary | ICD-10-CM | POA: Diagnosis not present

## 2022-07-14 DIAGNOSIS — G3185 Corticobasal degeneration: Secondary | ICD-10-CM | POA: Diagnosis not present

## 2022-07-14 DIAGNOSIS — F0284 Dementia in other diseases classified elsewhere, unspecified severity, with anxiety: Secondary | ICD-10-CM | POA: Diagnosis not present

## 2022-07-14 DIAGNOSIS — I10 Essential (primary) hypertension: Secondary | ICD-10-CM | POA: Diagnosis not present

## 2022-07-14 DIAGNOSIS — G629 Polyneuropathy, unspecified: Secondary | ICD-10-CM | POA: Diagnosis not present

## 2022-07-14 DIAGNOSIS — G253 Myoclonus: Secondary | ICD-10-CM | POA: Diagnosis not present

## 2022-07-14 DIAGNOSIS — F02811 Dementia in other diseases classified elsewhere, unspecified severity, with agitation: Secondary | ICD-10-CM | POA: Diagnosis not present

## 2022-07-16 DIAGNOSIS — G253 Myoclonus: Secondary | ICD-10-CM | POA: Diagnosis not present

## 2022-07-16 DIAGNOSIS — G3185 Corticobasal degeneration: Secondary | ICD-10-CM | POA: Diagnosis not present

## 2022-07-16 DIAGNOSIS — F02811 Dementia in other diseases classified elsewhere, unspecified severity, with agitation: Secondary | ICD-10-CM | POA: Diagnosis not present

## 2022-07-16 DIAGNOSIS — I10 Essential (primary) hypertension: Secondary | ICD-10-CM | POA: Diagnosis not present

## 2022-07-16 DIAGNOSIS — F0284 Dementia in other diseases classified elsewhere, unspecified severity, with anxiety: Secondary | ICD-10-CM | POA: Diagnosis not present

## 2022-07-16 DIAGNOSIS — G629 Polyneuropathy, unspecified: Secondary | ICD-10-CM | POA: Diagnosis not present

## 2022-07-24 DIAGNOSIS — I10 Essential (primary) hypertension: Secondary | ICD-10-CM | POA: Diagnosis not present

## 2022-07-24 DIAGNOSIS — G253 Myoclonus: Secondary | ICD-10-CM | POA: Diagnosis not present

## 2022-07-24 DIAGNOSIS — F02811 Dementia in other diseases classified elsewhere, unspecified severity, with agitation: Secondary | ICD-10-CM | POA: Diagnosis not present

## 2022-07-24 DIAGNOSIS — G629 Polyneuropathy, unspecified: Secondary | ICD-10-CM | POA: Diagnosis not present

## 2022-07-24 DIAGNOSIS — F0284 Dementia in other diseases classified elsewhere, unspecified severity, with anxiety: Secondary | ICD-10-CM | POA: Diagnosis not present

## 2022-07-24 DIAGNOSIS — G3185 Corticobasal degeneration: Secondary | ICD-10-CM | POA: Diagnosis not present

## 2022-07-30 DIAGNOSIS — F0284 Dementia in other diseases classified elsewhere, unspecified severity, with anxiety: Secondary | ICD-10-CM | POA: Diagnosis not present

## 2022-07-30 DIAGNOSIS — G253 Myoclonus: Secondary | ICD-10-CM | POA: Diagnosis not present

## 2022-07-30 DIAGNOSIS — G3185 Corticobasal degeneration: Secondary | ICD-10-CM | POA: Diagnosis not present

## 2022-07-30 DIAGNOSIS — I10 Essential (primary) hypertension: Secondary | ICD-10-CM | POA: Diagnosis not present

## 2022-07-30 DIAGNOSIS — F02811 Dementia in other diseases classified elsewhere, unspecified severity, with agitation: Secondary | ICD-10-CM | POA: Diagnosis not present

## 2022-07-30 DIAGNOSIS — G629 Polyneuropathy, unspecified: Secondary | ICD-10-CM | POA: Diagnosis not present

## 2022-08-01 DIAGNOSIS — G629 Polyneuropathy, unspecified: Secondary | ICD-10-CM | POA: Diagnosis not present

## 2022-08-01 DIAGNOSIS — G3185 Corticobasal degeneration: Secondary | ICD-10-CM | POA: Diagnosis not present

## 2022-08-01 DIAGNOSIS — I10 Essential (primary) hypertension: Secondary | ICD-10-CM | POA: Diagnosis not present

## 2022-08-01 DIAGNOSIS — G253 Myoclonus: Secondary | ICD-10-CM | POA: Diagnosis not present

## 2022-08-01 DIAGNOSIS — F02811 Dementia in other diseases classified elsewhere, unspecified severity, with agitation: Secondary | ICD-10-CM | POA: Diagnosis not present

## 2022-08-01 DIAGNOSIS — F0284 Dementia in other diseases classified elsewhere, unspecified severity, with anxiety: Secondary | ICD-10-CM | POA: Diagnosis not present

## 2022-08-06 DIAGNOSIS — R419 Unspecified symptoms and signs involving cognitive functions and awareness: Secondary | ICD-10-CM | POA: Diagnosis not present

## 2022-08-06 DIAGNOSIS — G253 Myoclonus: Secondary | ICD-10-CM | POA: Diagnosis not present

## 2022-08-06 DIAGNOSIS — Z955 Presence of coronary angioplasty implant and graft: Secondary | ICD-10-CM | POA: Diagnosis not present

## 2022-08-06 DIAGNOSIS — R41841 Cognitive communication deficit: Secondary | ICD-10-CM | POA: Diagnosis not present

## 2022-08-06 DIAGNOSIS — Z87891 Personal history of nicotine dependence: Secondary | ICD-10-CM | POA: Diagnosis not present

## 2022-08-06 DIAGNOSIS — G629 Polyneuropathy, unspecified: Secondary | ICD-10-CM | POA: Diagnosis not present

## 2022-08-06 DIAGNOSIS — G3185 Corticobasal degeneration: Secondary | ICD-10-CM | POA: Diagnosis not present

## 2022-08-06 DIAGNOSIS — F0284 Dementia in other diseases classified elsewhere, unspecified severity, with anxiety: Secondary | ICD-10-CM | POA: Diagnosis not present

## 2022-08-06 DIAGNOSIS — Z7982 Long term (current) use of aspirin: Secondary | ICD-10-CM | POA: Diagnosis not present

## 2022-08-06 DIAGNOSIS — I10 Essential (primary) hypertension: Secondary | ICD-10-CM | POA: Diagnosis not present

## 2022-08-06 DIAGNOSIS — F02811 Dementia in other diseases classified elsewhere, unspecified severity, with agitation: Secondary | ICD-10-CM | POA: Diagnosis not present

## 2022-08-07 DIAGNOSIS — G3185 Corticobasal degeneration: Secondary | ICD-10-CM | POA: Diagnosis not present

## 2022-08-07 DIAGNOSIS — I10 Essential (primary) hypertension: Secondary | ICD-10-CM | POA: Diagnosis not present

## 2022-08-07 DIAGNOSIS — G253 Myoclonus: Secondary | ICD-10-CM | POA: Diagnosis not present

## 2022-08-07 DIAGNOSIS — F0284 Dementia in other diseases classified elsewhere, unspecified severity, with anxiety: Secondary | ICD-10-CM | POA: Diagnosis not present

## 2022-08-07 DIAGNOSIS — F02811 Dementia in other diseases classified elsewhere, unspecified severity, with agitation: Secondary | ICD-10-CM | POA: Diagnosis not present

## 2022-08-07 DIAGNOSIS — G629 Polyneuropathy, unspecified: Secondary | ICD-10-CM | POA: Diagnosis not present

## 2022-08-10 ENCOUNTER — Other Ambulatory Visit: Payer: Self-pay | Admitting: Family Medicine

## 2022-08-11 NOTE — Telephone Encounter (Signed)
Unable to refill per protocol, last refill by  Provider 12/05/21 for 90 and 3 refills. Request is too soon.E-Prescribing Status: Receipt confirmed by pharmacy (12/05/2021 4:39 PM EST). Will refuse.  Requested Prescriptions  Pending Prescriptions Disp Refills  . donepezil (ARICEPT) 10 MG tablet [Pharmacy Med Name: DONEPEZIL HCL 10 MG TABLET] 90 tablet 3    Sig: TAKE 1 TABLET BY MOUTH EVERYDAY AT BEDTIME     Neurology:  Alzheimer's Agents Failed - 08/10/2022  2:53 PM      Failed - Valid encounter within last 6 months    Recent Outpatient Visits          7 months ago Neuropathy   [place] Susy Frizzle, MD   1 year ago Prostate cancer screening   [place] Susy Frizzle, MD   2 years ago Neuropathy   [place] Susy Frizzle, MD   2 years ago Dementia without behavioral disturbance, unspecified dementia type [facility])   [place] Susy Frizzle, MD   3 years ago Dementia without behavioral disturbance, unspecified dementia type ([place])   [facility] Pickard, Cammie Mcgee, MD

## 2022-08-13 DIAGNOSIS — F02811 Dementia in other diseases classified elsewhere, unspecified severity, with agitation: Secondary | ICD-10-CM | POA: Diagnosis not present

## 2022-08-13 DIAGNOSIS — G253 Myoclonus: Secondary | ICD-10-CM | POA: Diagnosis not present

## 2022-08-13 DIAGNOSIS — I10 Essential (primary) hypertension: Secondary | ICD-10-CM | POA: Diagnosis not present

## 2022-08-13 DIAGNOSIS — G3185 Corticobasal degeneration: Secondary | ICD-10-CM | POA: Diagnosis not present

## 2022-08-13 DIAGNOSIS — F0284 Dementia in other diseases classified elsewhere, unspecified severity, with anxiety: Secondary | ICD-10-CM | POA: Diagnosis not present

## 2022-08-13 DIAGNOSIS — G629 Polyneuropathy, unspecified: Secondary | ICD-10-CM | POA: Diagnosis not present

## 2022-08-26 DIAGNOSIS — I10 Essential (primary) hypertension: Secondary | ICD-10-CM | POA: Diagnosis not present

## 2022-08-26 DIAGNOSIS — R7303 Prediabetes: Secondary | ICD-10-CM | POA: Diagnosis not present

## 2022-08-26 DIAGNOSIS — E785 Hyperlipidemia, unspecified: Secondary | ICD-10-CM | POA: Diagnosis not present

## 2022-08-26 DIAGNOSIS — I2510 Atherosclerotic heart disease of native coronary artery without angina pectoris: Secondary | ICD-10-CM | POA: Diagnosis not present

## 2022-08-27 DIAGNOSIS — G629 Polyneuropathy, unspecified: Secondary | ICD-10-CM | POA: Diagnosis not present

## 2022-08-27 DIAGNOSIS — G253 Myoclonus: Secondary | ICD-10-CM | POA: Diagnosis not present

## 2022-08-27 DIAGNOSIS — F02811 Dementia in other diseases classified elsewhere, unspecified severity, with agitation: Secondary | ICD-10-CM | POA: Diagnosis not present

## 2022-08-27 DIAGNOSIS — I10 Essential (primary) hypertension: Secondary | ICD-10-CM | POA: Diagnosis not present

## 2022-08-27 DIAGNOSIS — G3185 Corticobasal degeneration: Secondary | ICD-10-CM | POA: Diagnosis not present

## 2022-08-27 DIAGNOSIS — F0284 Dementia in other diseases classified elsewhere, unspecified severity, with anxiety: Secondary | ICD-10-CM | POA: Diagnosis not present

## 2022-08-29 DIAGNOSIS — I2510 Atherosclerotic heart disease of native coronary artery without angina pectoris: Secondary | ICD-10-CM | POA: Diagnosis not present

## 2022-08-29 DIAGNOSIS — I10 Essential (primary) hypertension: Secondary | ICD-10-CM | POA: Diagnosis not present

## 2022-08-29 DIAGNOSIS — E785 Hyperlipidemia, unspecified: Secondary | ICD-10-CM | POA: Diagnosis not present

## 2022-08-29 DIAGNOSIS — R7303 Prediabetes: Secondary | ICD-10-CM | POA: Diagnosis not present

## 2022-09-01 DIAGNOSIS — G253 Myoclonus: Secondary | ICD-10-CM | POA: Diagnosis not present

## 2022-09-01 DIAGNOSIS — G629 Polyneuropathy, unspecified: Secondary | ICD-10-CM | POA: Diagnosis not present

## 2022-09-01 DIAGNOSIS — F02811 Dementia in other diseases classified elsewhere, unspecified severity, with agitation: Secondary | ICD-10-CM | POA: Diagnosis not present

## 2022-09-01 DIAGNOSIS — G3185 Corticobasal degeneration: Secondary | ICD-10-CM | POA: Diagnosis not present

## 2022-09-01 DIAGNOSIS — I10 Essential (primary) hypertension: Secondary | ICD-10-CM | POA: Diagnosis not present

## 2022-09-01 DIAGNOSIS — F0284 Dementia in other diseases classified elsewhere, unspecified severity, with anxiety: Secondary | ICD-10-CM | POA: Diagnosis not present

## 2022-09-02 DIAGNOSIS — I10 Essential (primary) hypertension: Secondary | ICD-10-CM | POA: Diagnosis not present

## 2022-09-02 DIAGNOSIS — G629 Polyneuropathy, unspecified: Secondary | ICD-10-CM | POA: Diagnosis not present

## 2022-09-02 DIAGNOSIS — F02811 Dementia in other diseases classified elsewhere, unspecified severity, with agitation: Secondary | ICD-10-CM | POA: Diagnosis not present

## 2022-09-02 DIAGNOSIS — F0284 Dementia in other diseases classified elsewhere, unspecified severity, with anxiety: Secondary | ICD-10-CM | POA: Diagnosis not present

## 2022-09-02 DIAGNOSIS — G3185 Corticobasal degeneration: Secondary | ICD-10-CM | POA: Diagnosis not present

## 2022-09-02 DIAGNOSIS — G253 Myoclonus: Secondary | ICD-10-CM | POA: Diagnosis not present

## 2022-09-05 DIAGNOSIS — Z87891 Personal history of nicotine dependence: Secondary | ICD-10-CM | POA: Diagnosis not present

## 2022-09-05 DIAGNOSIS — Z955 Presence of coronary angioplasty implant and graft: Secondary | ICD-10-CM | POA: Diagnosis not present

## 2022-09-05 DIAGNOSIS — G629 Polyneuropathy, unspecified: Secondary | ICD-10-CM | POA: Diagnosis not present

## 2022-09-05 DIAGNOSIS — G253 Myoclonus: Secondary | ICD-10-CM | POA: Diagnosis not present

## 2022-09-05 DIAGNOSIS — I10 Essential (primary) hypertension: Secondary | ICD-10-CM | POA: Diagnosis not present

## 2022-09-05 DIAGNOSIS — F0284 Dementia in other diseases classified elsewhere, unspecified severity, with anxiety: Secondary | ICD-10-CM | POA: Diagnosis not present

## 2022-09-05 DIAGNOSIS — G3185 Corticobasal degeneration: Secondary | ICD-10-CM | POA: Diagnosis not present

## 2022-09-05 DIAGNOSIS — R41841 Cognitive communication deficit: Secondary | ICD-10-CM | POA: Diagnosis not present

## 2022-09-05 DIAGNOSIS — F02811 Dementia in other diseases classified elsewhere, unspecified severity, with agitation: Secondary | ICD-10-CM | POA: Diagnosis not present

## 2022-09-17 DIAGNOSIS — F0284 Dementia in other diseases classified elsewhere, unspecified severity, with anxiety: Secondary | ICD-10-CM | POA: Diagnosis not present

## 2022-09-17 DIAGNOSIS — G3185 Corticobasal degeneration: Secondary | ICD-10-CM | POA: Diagnosis not present

## 2022-09-17 DIAGNOSIS — G629 Polyneuropathy, unspecified: Secondary | ICD-10-CM | POA: Diagnosis not present

## 2022-09-17 DIAGNOSIS — R41841 Cognitive communication deficit: Secondary | ICD-10-CM | POA: Diagnosis not present

## 2022-09-17 DIAGNOSIS — F02811 Dementia in other diseases classified elsewhere, unspecified severity, with agitation: Secondary | ICD-10-CM | POA: Diagnosis not present

## 2022-09-17 DIAGNOSIS — I10 Essential (primary) hypertension: Secondary | ICD-10-CM | POA: Diagnosis not present

## 2022-09-24 DIAGNOSIS — G3185 Corticobasal degeneration: Secondary | ICD-10-CM | POA: Diagnosis not present

## 2022-09-24 DIAGNOSIS — G629 Polyneuropathy, unspecified: Secondary | ICD-10-CM | POA: Diagnosis not present

## 2022-09-24 DIAGNOSIS — I10 Essential (primary) hypertension: Secondary | ICD-10-CM | POA: Diagnosis not present

## 2022-09-24 DIAGNOSIS — F02811 Dementia in other diseases classified elsewhere, unspecified severity, with agitation: Secondary | ICD-10-CM | POA: Diagnosis not present

## 2022-09-24 DIAGNOSIS — F0284 Dementia in other diseases classified elsewhere, unspecified severity, with anxiety: Secondary | ICD-10-CM | POA: Diagnosis not present

## 2022-09-24 DIAGNOSIS — R41841 Cognitive communication deficit: Secondary | ICD-10-CM | POA: Diagnosis not present

## 2022-10-01 DIAGNOSIS — F0280 Dementia in other diseases classified elsewhere without behavioral disturbance: Secondary | ICD-10-CM | POA: Diagnosis not present

## 2022-10-01 DIAGNOSIS — G3185 Corticobasal degeneration: Secondary | ICD-10-CM | POA: Diagnosis not present

## 2022-10-01 DIAGNOSIS — G253 Myoclonus: Secondary | ICD-10-CM | POA: Diagnosis not present

## 2022-10-01 DIAGNOSIS — R482 Apraxia: Secondary | ICD-10-CM | POA: Diagnosis not present

## 2022-10-03 DIAGNOSIS — F0284 Dementia in other diseases classified elsewhere, unspecified severity, with anxiety: Secondary | ICD-10-CM | POA: Diagnosis not present

## 2022-10-03 DIAGNOSIS — R41841 Cognitive communication deficit: Secondary | ICD-10-CM | POA: Diagnosis not present

## 2022-10-03 DIAGNOSIS — G3185 Corticobasal degeneration: Secondary | ICD-10-CM | POA: Diagnosis not present

## 2022-10-03 DIAGNOSIS — I10 Essential (primary) hypertension: Secondary | ICD-10-CM | POA: Diagnosis not present

## 2022-10-03 DIAGNOSIS — G629 Polyneuropathy, unspecified: Secondary | ICD-10-CM | POA: Diagnosis not present

## 2022-10-03 DIAGNOSIS — F02811 Dementia in other diseases classified elsewhere, unspecified severity, with agitation: Secondary | ICD-10-CM | POA: Diagnosis not present

## 2022-10-05 DIAGNOSIS — F02811 Dementia in other diseases classified elsewhere, unspecified severity, with agitation: Secondary | ICD-10-CM | POA: Diagnosis not present

## 2022-10-05 DIAGNOSIS — Z87891 Personal history of nicotine dependence: Secondary | ICD-10-CM | POA: Diagnosis not present

## 2022-10-05 DIAGNOSIS — F0284 Dementia in other diseases classified elsewhere, unspecified severity, with anxiety: Secondary | ICD-10-CM | POA: Diagnosis not present

## 2022-10-05 DIAGNOSIS — G3185 Corticobasal degeneration: Secondary | ICD-10-CM | POA: Diagnosis not present

## 2022-10-05 DIAGNOSIS — G253 Myoclonus: Secondary | ICD-10-CM | POA: Diagnosis not present

## 2022-10-05 DIAGNOSIS — G629 Polyneuropathy, unspecified: Secondary | ICD-10-CM | POA: Diagnosis not present

## 2022-10-05 DIAGNOSIS — I10 Essential (primary) hypertension: Secondary | ICD-10-CM | POA: Diagnosis not present

## 2022-10-05 DIAGNOSIS — Z955 Presence of coronary angioplasty implant and graft: Secondary | ICD-10-CM | POA: Diagnosis not present

## 2022-10-05 DIAGNOSIS — R41841 Cognitive communication deficit: Secondary | ICD-10-CM | POA: Diagnosis not present

## 2022-10-08 DIAGNOSIS — R41841 Cognitive communication deficit: Secondary | ICD-10-CM | POA: Diagnosis not present

## 2022-10-08 DIAGNOSIS — I10 Essential (primary) hypertension: Secondary | ICD-10-CM | POA: Diagnosis not present

## 2022-10-08 DIAGNOSIS — F0284 Dementia in other diseases classified elsewhere, unspecified severity, with anxiety: Secondary | ICD-10-CM | POA: Diagnosis not present

## 2022-10-08 DIAGNOSIS — G629 Polyneuropathy, unspecified: Secondary | ICD-10-CM | POA: Diagnosis not present

## 2022-10-08 DIAGNOSIS — G3185 Corticobasal degeneration: Secondary | ICD-10-CM | POA: Diagnosis not present

## 2022-10-08 DIAGNOSIS — F02811 Dementia in other diseases classified elsewhere, unspecified severity, with agitation: Secondary | ICD-10-CM | POA: Diagnosis not present

## 2022-10-15 DIAGNOSIS — F02811 Dementia in other diseases classified elsewhere, unspecified severity, with agitation: Secondary | ICD-10-CM | POA: Diagnosis not present

## 2022-10-15 DIAGNOSIS — F0284 Dementia in other diseases classified elsewhere, unspecified severity, with anxiety: Secondary | ICD-10-CM | POA: Diagnosis not present

## 2022-10-15 DIAGNOSIS — G629 Polyneuropathy, unspecified: Secondary | ICD-10-CM | POA: Diagnosis not present

## 2022-10-15 DIAGNOSIS — R41841 Cognitive communication deficit: Secondary | ICD-10-CM | POA: Diagnosis not present

## 2022-10-15 DIAGNOSIS — G3185 Corticobasal degeneration: Secondary | ICD-10-CM | POA: Diagnosis not present

## 2022-10-15 DIAGNOSIS — I10 Essential (primary) hypertension: Secondary | ICD-10-CM | POA: Diagnosis not present

## 2022-10-31 DIAGNOSIS — R41841 Cognitive communication deficit: Secondary | ICD-10-CM | POA: Diagnosis not present

## 2022-10-31 DIAGNOSIS — F0284 Dementia in other diseases classified elsewhere, unspecified severity, with anxiety: Secondary | ICD-10-CM | POA: Diagnosis not present

## 2022-10-31 DIAGNOSIS — G629 Polyneuropathy, unspecified: Secondary | ICD-10-CM | POA: Diagnosis not present

## 2022-10-31 DIAGNOSIS — I10 Essential (primary) hypertension: Secondary | ICD-10-CM | POA: Diagnosis not present

## 2022-10-31 DIAGNOSIS — F02811 Dementia in other diseases classified elsewhere, unspecified severity, with agitation: Secondary | ICD-10-CM | POA: Diagnosis not present

## 2022-10-31 DIAGNOSIS — G3185 Corticobasal degeneration: Secondary | ICD-10-CM | POA: Diagnosis not present

## 2022-11-04 DIAGNOSIS — F0284 Dementia in other diseases classified elsewhere, unspecified severity, with anxiety: Secondary | ICD-10-CM | POA: Diagnosis not present

## 2022-11-04 DIAGNOSIS — Z955 Presence of coronary angioplasty implant and graft: Secondary | ICD-10-CM | POA: Diagnosis not present

## 2022-11-04 DIAGNOSIS — G253 Myoclonus: Secondary | ICD-10-CM | POA: Diagnosis not present

## 2022-11-04 DIAGNOSIS — F02811 Dementia in other diseases classified elsewhere, unspecified severity, with agitation: Secondary | ICD-10-CM | POA: Diagnosis not present

## 2022-11-04 DIAGNOSIS — G3185 Corticobasal degeneration: Secondary | ICD-10-CM | POA: Diagnosis not present

## 2022-11-04 DIAGNOSIS — R41841 Cognitive communication deficit: Secondary | ICD-10-CM | POA: Diagnosis not present

## 2022-11-04 DIAGNOSIS — Z87891 Personal history of nicotine dependence: Secondary | ICD-10-CM | POA: Diagnosis not present

## 2022-11-04 DIAGNOSIS — G629 Polyneuropathy, unspecified: Secondary | ICD-10-CM | POA: Diagnosis not present

## 2022-11-04 DIAGNOSIS — I10 Essential (primary) hypertension: Secondary | ICD-10-CM | POA: Diagnosis not present

## 2022-11-07 DIAGNOSIS — G629 Polyneuropathy, unspecified: Secondary | ICD-10-CM | POA: Diagnosis not present

## 2022-11-07 DIAGNOSIS — G3185 Corticobasal degeneration: Secondary | ICD-10-CM | POA: Diagnosis not present

## 2022-11-07 DIAGNOSIS — I10 Essential (primary) hypertension: Secondary | ICD-10-CM | POA: Diagnosis not present

## 2022-11-07 DIAGNOSIS — F0284 Dementia in other diseases classified elsewhere, unspecified severity, with anxiety: Secondary | ICD-10-CM | POA: Diagnosis not present

## 2022-11-07 DIAGNOSIS — R41841 Cognitive communication deficit: Secondary | ICD-10-CM | POA: Diagnosis not present

## 2022-11-07 DIAGNOSIS — F02811 Dementia in other diseases classified elsewhere, unspecified severity, with agitation: Secondary | ICD-10-CM | POA: Diagnosis not present

## 2022-11-12 ENCOUNTER — Telehealth: Payer: Self-pay | Admitting: Family Medicine

## 2022-11-12 NOTE — Telephone Encounter (Signed)
Patient's wife came to the office last week to follow up on a bill patient received from [place]. I gave her the number for [facility] and she contacted them.  [facility] billing advised her we need to change the codes. A copy of the bill was placed on your desk.  Please advise at [phone].

## 2022-11-13 NOTE — Telephone Encounter (Signed)
I have placed the copy in nurses folder. The provider and nurse will need to look and add additional codes.

## 2022-11-14 DIAGNOSIS — G5600 Carpal tunnel syndrome, unspecified upper limb: Secondary | ICD-10-CM | POA: Insufficient documentation

## 2022-11-14 DIAGNOSIS — D509 Iron deficiency anemia, unspecified: Secondary | ICD-10-CM | POA: Insufficient documentation

## 2022-11-28 DIAGNOSIS — I2510 Atherosclerotic heart disease of native coronary artery without angina pectoris: Secondary | ICD-10-CM | POA: Diagnosis not present

## 2022-11-28 DIAGNOSIS — I10 Essential (primary) hypertension: Secondary | ICD-10-CM | POA: Diagnosis not present

## 2022-11-28 DIAGNOSIS — R7303 Prediabetes: Secondary | ICD-10-CM | POA: Diagnosis not present

## 2022-11-28 DIAGNOSIS — E785 Hyperlipidemia, unspecified: Secondary | ICD-10-CM | POA: Diagnosis not present

## 2022-12-01 DIAGNOSIS — R41841 Cognitive communication deficit: Secondary | ICD-10-CM | POA: Diagnosis not present

## 2022-12-01 DIAGNOSIS — G3185 Corticobasal degeneration: Secondary | ICD-10-CM | POA: Diagnosis not present

## 2022-12-01 DIAGNOSIS — F0284 Dementia in other diseases classified elsewhere, unspecified severity, with anxiety: Secondary | ICD-10-CM | POA: Diagnosis not present

## 2022-12-01 DIAGNOSIS — F02811 Dementia in other diseases classified elsewhere, unspecified severity, with agitation: Secondary | ICD-10-CM | POA: Diagnosis not present

## 2022-12-01 DIAGNOSIS — I10 Essential (primary) hypertension: Secondary | ICD-10-CM | POA: Diagnosis not present

## 2022-12-01 DIAGNOSIS — G629 Polyneuropathy, unspecified: Secondary | ICD-10-CM | POA: Diagnosis not present

## 2022-12-04 DIAGNOSIS — G253 Myoclonus: Secondary | ICD-10-CM | POA: Diagnosis not present

## 2022-12-04 DIAGNOSIS — F02811 Dementia in other diseases classified elsewhere, unspecified severity, with agitation: Secondary | ICD-10-CM | POA: Diagnosis not present

## 2022-12-04 DIAGNOSIS — I10 Essential (primary) hypertension: Secondary | ICD-10-CM | POA: Diagnosis not present

## 2022-12-04 DIAGNOSIS — Z87891 Personal history of nicotine dependence: Secondary | ICD-10-CM | POA: Diagnosis not present

## 2022-12-04 DIAGNOSIS — Z955 Presence of coronary angioplasty implant and graft: Secondary | ICD-10-CM | POA: Diagnosis not present

## 2022-12-04 DIAGNOSIS — F0284 Dementia in other diseases classified elsewhere, unspecified severity, with anxiety: Secondary | ICD-10-CM | POA: Diagnosis not present

## 2022-12-04 DIAGNOSIS — G3185 Corticobasal degeneration: Secondary | ICD-10-CM | POA: Diagnosis not present

## 2022-12-04 DIAGNOSIS — R41841 Cognitive communication deficit: Secondary | ICD-10-CM | POA: Diagnosis not present

## 2022-12-04 DIAGNOSIS — G629 Polyneuropathy, unspecified: Secondary | ICD-10-CM | POA: Diagnosis not present

## 2022-12-08 ENCOUNTER — Other Ambulatory Visit: Payer: Self-pay | Admitting: Family Medicine

## 2022-12-10 DIAGNOSIS — F02811 Dementia in other diseases classified elsewhere, unspecified severity, with agitation: Secondary | ICD-10-CM | POA: Diagnosis not present

## 2022-12-10 DIAGNOSIS — G629 Polyneuropathy, unspecified: Secondary | ICD-10-CM | POA: Diagnosis not present

## 2022-12-10 DIAGNOSIS — R41841 Cognitive communication deficit: Secondary | ICD-10-CM | POA: Diagnosis not present

## 2022-12-10 DIAGNOSIS — I10 Essential (primary) hypertension: Secondary | ICD-10-CM | POA: Diagnosis not present

## 2022-12-10 DIAGNOSIS — G3185 Corticobasal degeneration: Secondary | ICD-10-CM | POA: Diagnosis not present

## 2022-12-10 DIAGNOSIS — F0284 Dementia in other diseases classified elsewhere, unspecified severity, with anxiety: Secondary | ICD-10-CM | POA: Diagnosis not present

## 2022-12-17 NOTE — Patient Instructions (Incomplete)
[name] , Thank you for taking time to come for your Medicare Wellness Visit. I appreciate your ongoing commitment to your health goals. Please review the following plan we discussed and let me know if I can assist you in the future.   These are the goals we discussed:  Goals      Exercise 3x per week (30 min per time)     Try to increase walking and continue with healthy diet.        This is a list of the screening recommended for you and due dates:  Health Maintenance  Topic Date Due   Hepatitis C Screening: USPSTF Recommendation to screen - Ages 40-79 yo.  Never done   Zoster (Shingles) Vaccine (1 of 2) Never done   Pneumonia Vaccine (1 of 1 - PCV) Never done   DTaP/Tdap/Td vaccine (3 - Td or Tdap) 11/25/2020   Cologuard (Stool DNA test)  11/26/2021   Flu Shot  05/27/2022   COVID-19 Vaccine (3 - 2023-24 season) 06/27/2022   Medicare Annual Wellness Visit  12/12/2022   HPV Vaccine  Aged Out    Advanced directives: ***  Conditions/risks identified: Aim for 30 minutes of exercise or brisk walking, 6-8 glasses of water, and 5 servings of fruits and vegetables each day.   Next appointment: Follow up in one year for your annual wellness visit.   Preventive Care 71 Years and Older, Male  Preventive care refers to lifestyle choices and visits with your health care provider that can promote health and wellness. What does preventive care include? A yearly physical exam. This is also called an annual well check. Dental exams once or twice a year. Routine eye exams. Ask your health care provider how often you should have your eyes checked. Personal lifestyle choices, including: Daily care of your teeth and gums. Regular physical activity. Eating a healthy diet. Avoiding tobacco and drug use. Limiting alcohol use. Practicing safe sex. Taking low doses of aspirin every day. Taking vitamin and mineral supplements as recommended by your health care provider. What happens during  an annual well check? The services and screenings done by your health care provider during your annual well check will depend on your age, overall health, lifestyle risk factors, and family history of disease. Counseling  Your health care provider may ask you questions about your: Alcohol use. Tobacco use. Drug use. Emotional well-being. Home and relationship well-being. Sexual activity. Eating habits. History of falls. Memory and ability to understand (cognition). Work and work Statistician. Screening  You may have the following tests or measurements: Height, weight, and BMI. Blood pressure. Lipid and cholesterol levels. These may be checked every 5 years, or more frequently if you are over 50 years old. Skin check. Lung cancer screening. You may have this screening every year starting at age 39 if you have a 30-pack-year history of smoking and currently smoke or have quit within the past 15 years. Fecal occult blood test (FOBT) of the stool. You may have this test every year starting at age 66. Flexible sigmoidoscopy or colonoscopy. You may have a sigmoidoscopy every 5 years or a colonoscopy every 10 years starting at age 66. Prostate cancer screening. Recommendations will vary depending on your family history and other risks. Hepatitis C blood test. Hepatitis B blood test. Sexually transmitted disease (STD) testing. Diabetes screening. This is done by checking your blood sugar (glucose) after you have not eaten for a while (fasting). You may have this done every 1-3 years.  Abdominal aortic aneurysm (AAA) screening. You may need this if you are a current or former smoker. Osteoporosis. You may be screened starting at age 71 if you are at high risk. Talk with your health care provider about your test results, treatment options, and if necessary, the need for more tests. Vaccines  Your health care provider may recommend certain vaccines, such as: Influenza vaccine. This is recommended 71 every year. Tetanus, diphtheria, and acellular pertussis (Tdap, Td) vaccine. You may need a Td booster every 10 years. Zoster vaccine. You may need this after age 71. Pneumococcal 13-valent conjugate (PCV13) vaccine. One dose is recommended after age 71. Pneumococcal polysaccharide (PPSV23) vaccine. One dose is recommended after age 71. Talk to your health care provider about which screenings and vaccines you need and how often you need them. This information is not intended to replace advice given to you by your health care provider. Make sure you discuss any questions you have with your health care provider. Document Released: 11/09/2015 Document Revised: 07/02/2016 Document Reviewed: 08/14/2015 Elsevier Interactive Patient Education  2017 [place] Prevention in the Home Falls can cause injuries. They can happen to people of all ages. There are many things you can do to make your home safe and to help prevent falls. What can I do on the outside of my home? Regularly fix the edges of walkways and driveways and fix any cracks. Remove anything that might make you trip as you walk through a door, such as a raised step or threshold. Trim any bushes or trees on the path to your home. Use bright outdoor lighting. Clear any walking paths of anything that might make someone trip, such as rocks or tools. Regularly check to see if handrails are loose or broken. Make sure that both sides of any steps have handrails. Any raised decks and porches should have guardrails on the edges. Have any leaves, snow, or ice cleared regularly. Use sand or salt on walking paths during winter. Clean up any spills in your garage right away. This includes oil or grease spills. What can I do in the bathroom? Use night lights. Install grab bars by the toilet and in the tub and shower. Do not use towel bars as grab bars. Use non-skid mats or decals in the tub or shower. If you need to sit down in the shower,  use a plastic, non-slip stool. Keep the floor dry. Clean up any water that spills on the floor as soon as it happens. Remove soap buildup in the tub or shower regularly. Attach bath mats securely with double-sided non-slip rug tape. Do not have throw rugs and other things on the floor that can make you trip. What can I do in the bedroom? Use night lights. Make sure that you have a light by your bed that is easy to reach. Do not use any sheets or blankets that are too big for your bed. They should not hang down onto the floor. Have a firm chair that has side arms. You can use this for support while you get dressed. Do not have throw rugs and other things on the floor that can make you trip. What can I do in the kitchen? Clean up any spills right away. Avoid walking on wet floors. Keep items that you use a lot in easy-to-reach places. If you need to reach something above you, use a strong step stool that has a grab bar. Keep electrical cords out of the way. Do not  use floor polish or wax that makes floors slippery. If you must use wax, use non-skid floor wax. Do not have throw rugs and other things on the floor that can make you trip. What can I do with my stairs? Do not leave any items on the stairs. Make sure that there are handrails on both sides of the stairs and use them. Fix handrails that are broken or loose. Make sure that handrails are as long as the stairways. Check any carpeting to make sure that it is firmly attached to the stairs. Fix any carpet that is loose or worn. Avoid having throw rugs at the top or bottom of the stairs. If you do have throw rugs, attach them to the floor with carpet tape. Make sure that you have a light switch at the top of the stairs and the bottom of the stairs. If you do not have them, ask someone to add them for you. What else can I do to help prevent falls? Wear shoes that: Do not have high heels. Have rubber bottoms. Are comfortable and fit you  well. Are closed at the toe. Do not wear sandals. If you use a stepladder: Make sure that it is fully opened. Do not climb a closed stepladder. Make sure that both sides of the stepladder are locked into place. Ask someone to hold it for you, if possible. Clearly mark and make sure that you can see: Any grab bars or handrails. First and last steps. Where the edge of each step is. Use tools that help you move around (mobility aids) if they are needed. These include: Canes. Walkers. Scooters. Crutches. Turn on the lights when you go into a dark area. Replace any light bulbs as soon as they burn out. Set up your furniture so you have a clear path. Avoid moving your furniture around. If any of your floors are uneven, fix them. If there are any pets around you, be aware of where they are. Review your medicines with your doctor. Some medicines can make you feel dizzy. This can increase your chance of falling. Ask your doctor what other things that you can do to help prevent falls. This information is not intended to replace advice given to you by your health care provider. Make sure you discuss any questions you have with your health care provider. Document Released: 08/09/2009 Document Revised: 03/20/2016 Document Reviewed: 11/17/2014 Elsevier Interactive Patient Education  2017 [employer].

## 2022-12-17 NOTE — Progress Notes (Deleted)
Subjective:   Jay Rivas is a 72 y.o. male who presents for Medicare Annual/Subsequent preventive examination.  Review of Systems    ***       Objective:    There were no vitals filed for this visit. There is no height or weight on file to calculate BMI.     01/03/2022    9:53 AM 12/12/2021    8:30 AM 11/02/2018    7:19 AM 10/25/2018    9:38 AM 03/09/2018    7:49 AM 03/03/2018   12:18 PM 01/06/2018    3:03 PM  Advanced Directives  Does Patient Have a Medical Advance Directive? No No No No No No No  Would patient like information on creating a medical advance directive? Yes (MAU/Ambulatory/Procedural Areas - Information given) No - Patient declined No - Patient declined No - Patient declined Yes (MAU/Ambulatory/Procedural Areas - Information given) No - Patient declined No - Patient declined    Current Medications (verified) Outpatient Encounter Medications as of 12/18/2022  Medication Sig   aspirin EC 81 MG tablet Take 81 mg by mouth daily.   carbidopa-levodopa (SINEMET IR) 25-250 MG tablet TAKE 1 TABLET BY MOUTH THREE TIMES A DAY (Patient not taking: Reported on 03/17/2022)   Coconut Oil 1000 MG CAPS Take by mouth.   Coenzyme Q10 (COQ-10) 200 MG CAPS Take by mouth.   donepezil (ARICEPT) 10 MG tablet TAKE 1 TABLET BY MOUTH EVERYDAY AT BEDTIME   ezetimibe (ZETIA) 10 MG tablet Take 10 mg by mouth daily. (Patient not taking: Reported on 01/09/2022)   hydrocortisone 2.5 % lotion Apply topically.   ketoconazole (NIZORAL) 2 % shampoo SMARTSIG:Topical 2-3 Times Weekly   losartan (COZAAR) 100 MG tablet Take 100 mg by mouth daily.   Omega-3 Fatty Acids (FISH OIL) 1000 MG CAPS Take by mouth.   No facility-administered encounter medications on file as of 12/18/2022.    Allergies (verified) Penicillin g   History: Past Medical History:  Diagnosis Date   Carpal tunnel syndrome on both sides    Dementia ([place])    diagnosed in jan 2019, pt states medication seems to be helping, A&Ox4    HLD (hyperlipidemia)    Hypertension    Memory disorder 11/24/2017   Myocardial infarction [facility])    Dr. Terrence Dupont   Spinal stenosis of cervical region    Past Surgical History:  Procedure Laterality Date   ABDOMINAL SURGERY     CARDIAC CATHETERIZATION  2005   stent   CARPAL TUNNEL RELEASE Right    CARPAL TUNNEL RELEASE Right 03/09/2018   Procedure: RIGHT CARPAL TUNNEL RELEASE;  Surgeon: Daryll Brod, MD;  Location: [place];  Service: Orthopedics;  Laterality: Right;   CARPAL TUNNEL RELEASE Left 11/02/2018   Procedure: LEFT CARPAL TUNNEL RELEASE;  Surgeon: Daryll Brod, MD;  Location: [place];  Service: Orthopedics;  Laterality: Left;   TENDON TRANSFER Left 11/02/2018   Procedure: LEFT CAMITZ TRANSFER;  Surgeon: Daryll Brod, MD;  Location: [place];  Service: Orthopedics;  Laterality: Left;   TRIGGER FINGER RELEASE Right 03/09/2018   Procedure: RELEASE TRIGGER FINGER/A-1 PULLEY RIGHT MIDDLE, RING AND SMALL FINGERS;  Surgeon: Daryll Brod, MD;  Location: [place];  Service: Orthopedics;  Laterality: Right;   ULNAR NERVE TRANSPOSITION Left 11/02/2018   Procedure: LEFT ULNAR NERVE DECOMPRESSION;  Surgeon: Daryll Brod, MD;  Location: [place];  Service: Orthopedics;  Laterality: Left;   ULNAR TUNNEL RELEASE Right 03/09/2018  Procedure: CUBITAL TUNNEL DECOMPRESSION RIGHT ELBOW;  Surgeon: Daryll Brod, MD;  Location: [place];  Service: Orthopedics;  Laterality: Right;   No family history on file. Social History   Socioeconomic History   Marital status: Married    Spouse name: Evelena Peat   Number of children: 2   Years of education: Not on file   Highest education level: Not on file  Occupational History   Not on file  Tobacco Use   Smoking status: Former   Smokeless tobacco: Former   Tobacco comments:    quit 1990  Substance and Sexual Activity   Alcohol use: Yes    Alcohol/week: 24.0  standard drinks of alcohol    Types: 24 Cans of beer per week    Comment: social beer only   Drug use: No   Sexual activity: Yes  Other Topics Concern   Not on file  Social History Narrative   Lives with wife, Evelena Peat.    1 son and 1 daughter.   2 grandchildren.   Right handed    Social Determinants of Health   Financial Resource Strain: Low Risk  (12/12/2021)   Overall Financial Resource Strain (CARDIA)    Difficulty of Paying Living Expenses: Not hard at all  Food Insecurity: No Food Insecurity (01/03/2022)   Hunger Vital Sign    Worried About Running Out of Food in the Last Year: Never true    Ran Out of Food in the Last Year: Never true  Transportation Needs: No Transportation Needs (01/03/2022)   PRAPARE - Hydrologist (Medical): No    Lack of Transportation (Non-Medical): No  Physical Activity: Insufficiently Active (12/12/2021)   Exercise Vital Sign    Days of Exercise per Week: 3 days    Minutes of Exercise per Session: 20 min  Stress: No Stress Concern Present (12/12/2021)   [place]    Feeling of Stress : Not at all  Social Connections: [place] (12/12/2021)   Social Connection and Isolation Panel [NHANES]    Frequency of Communication with Friends and Family: More than three times a week    Frequency of Social Gatherings with Friends and Family: More than three times a week    Attends Religious Services: 1 to 4 times per year    Active Member of [employer] or Organizations: Yes    Attends Archivist Meetings: 1 to 4 times per year    Marital Status: Married    Tobacco Counseling Counseling given: Not Answered Tobacco comments: quit 1990   Clinical Intake:                 Diabetic?No          Activities of Daily Living     No data to display          Patient Care Team: Susy Frizzle, MD as PCP - General (Family  Medicine)  Indicate any recent Medical Services you may have received from other than [facility] providers in the past year (date may be approximate).     Assessment:   This is a routine wellness examination for Jay Rivas.  Hearing/Vision screen No results found.  Dietary issues and exercise activities discussed:     Goals Addressed   None    Depression Screen    01/03/2022    9:51 AM 12/12/2021    8:25 AM 04/30/2021    9:09 AM 11/22/2018  8:00 AM 11/20/2017   12:34 PM 10/22/2017    2:36 PM 09/29/2017    3:37 PM  PHQ 2/9 Scores  PHQ - 2 Score 0 0 0 1 0 0 0    Fall Risk    01/03/2022    9:51 AM 12/12/2021    8:31 AM 04/30/2021    9:09 AM 05/26/2019    3:28 PM 11/20/2017   12:34 PM  Fall Risk   Falls in the past year? 1 1 0 0 No  Comment    Emmi Telephone Survey: data to providers prior to load   Number falls in past yr: 0 0 0    Injury with Fall? 0 0 0    Risk for fall due to :  History of fall(s);Impaired balance/gait;Mental status change;Other (Comment) No Fall Risks    Risk for fall due to: Comment  Pt has dementia.     Follow up  Falls prevention discussed Falls evaluation completed      FALL RISK PREVENTION PERTAINING TO THE HOME:  Any stairs in or around the home? {YES/NO:21197} If so, are there any without handrails? {YES/NO:21197} Home free of loose throw rugs in walkways, pet beds, electrical cords, etc? {YES/NO:21197} Adequate lighting in your home to reduce risk of falls? {YES/NO:21197}  ASSISTIVE DEVICES UTILIZED TO PREVENT FALLS:  Life alert? {YES/NO:21197} Use of a cane, walker or w/c? {YES/NO:21197} Grab bars in the bathroom? {YES/NO:21197} Shower chair or bench in shower? {YES/NO:21197} Elevated toilet seat or a handicapped toilet? {YES/NO:21197}  TIMED UP AND GO:  Was the test performed? No . Telephonic visit   Cognitive Function:    05/24/2018    8:18 AM 11/24/2017   11:19 AM  MMSE - Mini Mental State Exam  Orientation to time 5 4  Orientation to  Place 5 2  Registration 3 3  Attention/ Calculation 0 0  Recall 2 2  Language- name 2 objects 2 2  Language- repeat 1 1  Language- follow 3 step command 3 2  Language- read & follow direction 1 1  Write a sentence 0 1  Copy design 0 1  Total score 22 19        Immunizations Immunization History  Administered Date(s) Administered   Influenza,inj,Quad PF,6+ Mos 09/06/2014   PFIZER(Purple Top)SARS-COV-2 Vaccination 05/31/2020, 06/21/2020   Td 11/25/2010   Tdap 11/25/2010    TDAP status: Due, Education has been provided regarding the importance of this vaccine. Advised may receive this vaccine at local pharmacy or Health Dept. Aware to provide a copy of the vaccination record if obtained from local pharmacy or Health Dept. Verbalized acceptance and understanding.  {Flu Vaccine status:2101806}  {Pneumococcal vaccine status:2101807}  Covid-19 vaccine status: Information provided on how to obtain vaccines.   Qualifies for Shingles Vaccine? Yes   Zostavax completed No   Shingrix Completed?: No.    Education has been provided regarding the importance of this vaccine. Patient has been advised to call insurance company to determine out of pocket expense if they have not yet received this vaccine. Advised may also receive vaccine at local pharmacy or Health Dept. Verbalized acceptance and understanding.  Screening Tests Health Maintenance  Topic Date Due   Hepatitis C Screening  Never done   Zoster Vaccines- Shingrix (1 of 2) Never done   Pneumonia Vaccine 80+ Years old (1 of 1 - PCV) Never done   DTaP/Tdap/Td (3 - Td or Tdap) 11/25/2020   Fecal DNA (Cologuard)  11/26/2021   INFLUENZA VACCINE  05/27/2022   COVID-19 Vaccine (3 - 2023-24 season) 06/27/2022   Medicare Annual Wellness (AWV)  12/12/2022   HPV VACCINES  Aged Out    Health Maintenance  Health Maintenance Due  Topic Date Due   Hepatitis C Screening  Never done   Zoster Vaccines- Shingrix (1 of 2) Never done    Pneumonia Vaccine 69+ Years old (1 of 1 - PCV) Never done   DTaP/Tdap/Td (3 - Td or Tdap) 11/25/2020   Fecal DNA (Cologuard)  11/26/2021   INFLUENZA VACCINE  05/27/2022   COVID-19 Vaccine (3 - 2023-24 season) 06/27/2022   Medicare Annual Wellness (AWV)  12/12/2022    {Colorectal cancer screening:2101809}  Lung Cancer Screening: (Low Dose CT Chest recommended if Age 84-80 years, 30 pack-year currently smoking OR have quit w/in 15years.) does not qualify.   Lung Cancer Screening Referral: n/a  Additional Screening:  Hepatitis C Screening: {DOES NOT does:27190::"does not"} qualify; Completed ***  Vision Screening: Recommended annual ophthalmology exams for early detection of glaucoma and other disorders of the eye. Is the patient up to date with their annual eye exam?  {YES/NO:21197} Who is the provider or what is the name of the office in which the patient attends annual eye exams? *** If pt is not established with a provider, would they like to be referred to a provider to establish care? {YES/NO:21197}.   Dental Screening: Recommended annual dental exams for proper oral hygiene  Community Resource Referral / Chronic Care Management: CRR required this visit?  {YES/NO:21197}  CCM required this visit?  {YES/NO:21197}     Plan:     I have personally reviewed and noted the following in the patient's chart:   Medical and social history Use of alcohol, tobacco or illicit drugs  Current medications and supplements including opioid prescriptions. {Opioid Prescriptions:[phone]} Functional ability and status Nutritional status Physical activity Advanced directives List of other physicians Hospitalizations, surgeries, and ER visits in previous 12 months Vitals Screenings to include cognitive, depression, and falls Referrals and appointments  In addition, I have reviewed and discussed with patient certain preventive protocols, quality metrics, and best practice  recommendations. A written personalized care plan for preventive services as well as general preventive health recommendations were provided to patient.     Vanetta Mulders, [place]   [id number]   Due to this being a virtual visit, the after visit summary with patients personalized plan was offered to patient via mail or my-chart. ***Patient declined at this time./ Patient would like to access on my-chart/ per request, patient was mailed a copy of AVS./ Patient preferred to pick up at office at next visit   Nurse Notes: ***

## 2022-12-22 DIAGNOSIS — L570 Actinic keratosis: Secondary | ICD-10-CM | POA: Diagnosis not present

## 2022-12-22 DIAGNOSIS — D2261 Melanocytic nevi of right upper limb, including shoulder: Secondary | ICD-10-CM | POA: Diagnosis not present

## 2022-12-22 DIAGNOSIS — D2262 Melanocytic nevi of left upper limb, including shoulder: Secondary | ICD-10-CM | POA: Diagnosis not present

## 2022-12-22 DIAGNOSIS — Z85828 Personal history of other malignant neoplasm of skin: Secondary | ICD-10-CM | POA: Diagnosis not present

## 2022-12-22 DIAGNOSIS — X32XXXA Exposure to sunlight, initial encounter: Secondary | ICD-10-CM | POA: Diagnosis not present

## 2022-12-22 DIAGNOSIS — L821 Other seborrheic keratosis: Secondary | ICD-10-CM | POA: Diagnosis not present

## 2022-12-22 DIAGNOSIS — D225 Melanocytic nevi of trunk: Secondary | ICD-10-CM | POA: Diagnosis not present

## 2022-12-22 DIAGNOSIS — D2272 Melanocytic nevi of left lower limb, including hip: Secondary | ICD-10-CM | POA: Diagnosis not present

## 2022-12-24 DIAGNOSIS — G3185 Corticobasal degeneration: Secondary | ICD-10-CM | POA: Diagnosis not present

## 2022-12-24 DIAGNOSIS — F0284 Dementia in other diseases classified elsewhere, unspecified severity, with anxiety: Secondary | ICD-10-CM | POA: Diagnosis not present

## 2022-12-24 DIAGNOSIS — R41841 Cognitive communication deficit: Secondary | ICD-10-CM | POA: Diagnosis not present

## 2022-12-24 DIAGNOSIS — F02811 Dementia in other diseases classified elsewhere, unspecified severity, with agitation: Secondary | ICD-10-CM | POA: Diagnosis not present

## 2022-12-24 DIAGNOSIS — I10 Essential (primary) hypertension: Secondary | ICD-10-CM | POA: Diagnosis not present

## 2022-12-24 DIAGNOSIS — G629 Polyneuropathy, unspecified: Secondary | ICD-10-CM | POA: Diagnosis not present

## 2022-12-31 DIAGNOSIS — G629 Polyneuropathy, unspecified: Secondary | ICD-10-CM | POA: Diagnosis not present

## 2022-12-31 DIAGNOSIS — F02811 Dementia in other diseases classified elsewhere, unspecified severity, with agitation: Secondary | ICD-10-CM | POA: Diagnosis not present

## 2022-12-31 DIAGNOSIS — R41841 Cognitive communication deficit: Secondary | ICD-10-CM | POA: Diagnosis not present

## 2022-12-31 DIAGNOSIS — I10 Essential (primary) hypertension: Secondary | ICD-10-CM | POA: Diagnosis not present

## 2022-12-31 DIAGNOSIS — G3185 Corticobasal degeneration: Secondary | ICD-10-CM | POA: Diagnosis not present

## 2022-12-31 DIAGNOSIS — F0284 Dementia in other diseases classified elsewhere, unspecified severity, with anxiety: Secondary | ICD-10-CM | POA: Diagnosis not present

## 2023-02-11 ENCOUNTER — Telehealth: Payer: Self-pay | Admitting: Family Medicine

## 2023-02-11 NOTE — Telephone Encounter (Signed)
Called patient to schedule Medicare Annual Wellness Visit (AWV). Left message for patient to call back and schedule Medicare Annual Wellness Visit (AWV).  Last date of AWV: 12/12/2021   Please schedule an appointment at any time with Toni Amend, [facility] .  If any questions, please contact me at [phone].  Thank you,  Judeth Cornfield,  AMB Clinical Support [facility] AWV Program Direct Dial ??[phone]

## 2023-02-16 DIAGNOSIS — G253 Myoclonus: Secondary | ICD-10-CM | POA: Diagnosis not present

## 2023-02-16 DIAGNOSIS — G3185 Corticobasal degeneration: Secondary | ICD-10-CM | POA: Diagnosis not present

## 2023-02-16 DIAGNOSIS — R482 Apraxia: Secondary | ICD-10-CM | POA: Diagnosis not present

## 2023-02-16 DIAGNOSIS — G5603 Carpal tunnel syndrome, bilateral upper limbs: Secondary | ICD-10-CM | POA: Diagnosis not present

## 2023-02-16 DIAGNOSIS — F0280 Dementia in other diseases classified elsewhere without behavioral disturbance: Secondary | ICD-10-CM | POA: Diagnosis not present

## 2023-02-27 DIAGNOSIS — I2510 Atherosclerotic heart disease of native coronary artery without angina pectoris: Secondary | ICD-10-CM | POA: Diagnosis not present

## 2023-02-27 DIAGNOSIS — E782 Mixed hyperlipidemia: Secondary | ICD-10-CM | POA: Diagnosis not present

## 2023-02-27 DIAGNOSIS — I10 Essential (primary) hypertension: Secondary | ICD-10-CM | POA: Diagnosis not present

## 2023-02-27 DIAGNOSIS — R7303 Prediabetes: Secondary | ICD-10-CM | POA: Diagnosis not present

## 2023-05-12 DIAGNOSIS — S01511A Laceration without foreign body of lip, initial encounter: Secondary | ICD-10-CM | POA: Diagnosis not present

## 2023-05-22 DIAGNOSIS — S01511S Laceration without foreign body of lip, sequela: Secondary | ICD-10-CM | POA: Diagnosis not present

## 2023-06-01 ENCOUNTER — Encounter: Payer: Self-pay | Admitting: Family Medicine

## 2023-06-01 ENCOUNTER — Ambulatory Visit (INDEPENDENT_AMBULATORY_CARE_PROVIDER_SITE_OTHER): Payer: Medicare Other | Admitting: Family Medicine

## 2023-06-01 VITALS — BP 120/72 | HR 71 | Temp 97.5°F | Ht 68.0 in | Wt 147.8 lb

## 2023-06-01 DIAGNOSIS — G3185 Corticobasal degeneration: Secondary | ICD-10-CM | POA: Diagnosis not present

## 2023-06-01 MED ORDER — CARBIDOPA-LEVODOPA 25-250 MG PO TABS: 1.0000 | ORAL_TABLET | Freq: Three times a day (TID) | ORAL | 3 refills | Status: DC

## 2023-06-01 NOTE — Progress Notes (Signed)
Subjective:    Patient ID: Jay Rivas, male    DOB: 04-20-1951, 72 y.o.   MRN: [id number]      01/09/22 As mentioned at his physical exam last year, the patient has seen neurology at [facility] and been diagnosed with corticobasal degeneration.  We discussed possibly trying Sinemet for the pill-rolling tremor and cogwheel rigidity in his right arm as well as some of the bradykinesia.  We discussed the benefit at best is modest and only temporary.  At that time, he declined trying the medication.  However, the tremor is getting worse making it difficult for him to do certain everyday tasks.  He is now interested in trying the medication.  He is also having word finding difficulties.  He states that he knows what he wants to say but he has a hard time making his body and now speak the words.  Memory loss seems to be stable.  He denies any falls.  He is still very active and driving.  He denies any chest pain or shortness of breath or dyspnea on exertion.  06/01/23 I have not seen the patient in the last 16 months.  However since my last visit with him, there is been significant progression.  Today he is essentially nonverbal.  He has an extremely difficult time following commands.  I tried to perform finger-to-nose testing with the patient and he was unable to follow the commands to perform the testing.  He also had a difficult time on his neurologic exam following the flashlight with his eyes.  I performed a Mini-Mental status exam.  The patient could not tell me my name or location.  He cannot tell me the year or the date.  He knew that I was his doctor but that was the extent of his memory.  He was only able to repeat 1 word out of 3 on recall and that occurred immediately after.  He could not even begin to attempt serial sevens or spelling world in reverse.  Therefore the dementia has progressed substantially.  His wife is concerned because he has been falling recently.  Today on exam, the patient has a  pronounced slow shuffling gait.  He has very little arm swing.  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  He has a Patent examiner with significant bradykinesia   Past Medical History:  Diagnosis Date   Carpal tunnel syndrome on both sides    Dementia ([facility])    diagnosed in jan 2019, pt states medication seems to be helping, A&Ox4   HLD (hyperlipidemia)    Hypertension    Memory disorder 11/24/2017   Myocardial infarction [facility])    Dr. Sharyn Lull   Spinal stenosis of cervical region    Past Surgical History:  Procedure Laterality Date   ABDOMINAL SURGERY     CARDIAC CATHETERIZATION  2005   stent   CARPAL TUNNEL RELEASE Right    CARPAL TUNNEL RELEASE Right 03/09/2018   Procedure: RIGHT CARPAL TUNNEL RELEASE;  Surgeon: Cindee Salt, MD;  Location: [facility];  Service: Orthopedics;  Laterality: Right;   CARPAL TUNNEL RELEASE Left 11/02/2018   Procedure: LEFT CARPAL TUNNEL RELEASE;  Surgeon: Cindee Salt, MD;  Location: [facility];  Service: Orthopedics;  Laterality: Left;   TENDON TRANSFER Left 11/02/2018   Procedure: LEFT CAMITZ TRANSFER;  Surgeon: Cindee Salt, MD;  Location: [facility];  Service: Orthopedics;  Laterality: Left;   TRIGGER  FINGER RELEASE Right 03/09/2018   Procedure: RELEASE TRIGGER FINGER/A-1 PULLEY RIGHT MIDDLE, RING AND SMALL FINGERS;  Surgeon: Cindee Salt, MD;  Location: [facility];  Service: Orthopedics;  Laterality: Right;   ULNAR NERVE TRANSPOSITION Left 11/02/2018   Procedure: LEFT ULNAR NERVE DECOMPRESSION;  Surgeon: Cindee Salt, MD;  Location: [facility];  Service: Orthopedics;  Laterality: Left;   ULNAR TUNNEL RELEASE Right 03/09/2018   Procedure: CUBITAL TUNNEL DECOMPRESSION RIGHT ELBOW;  Surgeon: Cindee Salt, MD;  Location: [facility];  Service: Orthopedics;  Laterality: Right;   Current Outpatient Medications on File Prior to Visit   Medication Sig Dispense Refill   aspirin EC 81 MG tablet Take 81 mg by mouth daily.     Coconut Oil 1000 MG CAPS Take by mouth.     Coenzyme Q10 (COQ-10) 200 MG CAPS Take by mouth.     donepezil (ARICEPT) 10 MG tablet TAKE 1 TABLET BY MOUTH EVERYDAY AT BEDTIME 90 tablet 3   hydrocortisone 2.5 % lotion Apply topically.     ketoconazole (NIZORAL) 2 % shampoo SMARTSIG:Topical 2-3 Times Weekly     losartan (COZAAR) 100 MG tablet Take 100 mg by mouth daily.     Omega-3 Fatty Acids (FISH OIL) 1000 MG CAPS Take by mouth.     No current facility-administered medications on file prior to visit.   Allergies  Allergen Reactions   Penicillin G Anaphylaxis   Social History   Socioeconomic History   Marital status: Married    Spouse name: Brayton Caves   Number of children: 2   Years of education: Not on file   Highest education level: Not on file  Occupational History   Not on file  Tobacco Use   Smoking status: Former   Smokeless tobacco: Former   Tobacco comments:    quit 1990  Substance and Sexual Activity   Alcohol use: Yes    Alcohol/week: 24.0 standard drinks of alcohol    Types: 24 Cans of beer per week    Comment: social beer only   Drug use: No   Sexual activity: Yes  Other Topics Concern   Not on file  Social History Narrative   Lives with wife, Brayton Caves.    1 son and 1 daughter.   2 grandchildren.   Right handed    Social Determinants of Health   Financial Resource Strain: Low Risk  (12/12/2021)   Overall Financial Resource Strain (CARDIA)    Difficulty of Paying Living Expenses: Not hard at all  Food Insecurity: No Food Insecurity (01/03/2022)   Hunger Vital Sign    Worried About Running Out of Food in the Last Year: Never true    Ran Out of Food in the Last Year: Never true  Transportation Needs: No Transportation Needs (01/03/2022)   PRAPARE - Administrator, Civil Service (Medical): No    Lack of Transportation (Non-Medical): No  Physical Activity:  Insufficiently Active (12/12/2021)   Exercise Vital Sign    Days of Exercise per Week: 3 days    Minutes of Exercise per Session: 20 min  Stress: No Stress Concern Present (12/12/2021)   [employer] of Occupational Health - Occupational Stress Questionnaire    Feeling of Stress : Not at all  Social Connections: Socially Integrated (12/12/2021)   Social Connection and Isolation Panel [NHANES]    Frequency of Communication with Friends and Family: More than three times a week    Frequency of Social Gatherings with Friends  and Family: More than three times a week    Attends Religious Services: 1 to 4 times per year    Active Member of Clubs or Organizations: Yes    Attends Banker Meetings: 1 to 4 times per year    Marital Status: Married  Catering manager Violence: Not At Risk (12/12/2021)   Humiliation, Afraid, Rape, and Kick questionnaire    Fear of Current or Ex-Partner: No    Emotionally Abused: No    Physically Abused: No    Sexually Abused: No   No family history on file.     Review of Systems  Psychiatric/Behavioral:  Positive for depression.   All other systems reviewed and are negative.      Objective:   Physical Exam Vitals reviewed.  Constitutional:      General: He is not in acute distress.    Appearance: He is well-developed. He is not diaphoretic.  HENT:     Head: Normocephalic and atraumatic.     Right Ear: External ear normal.     Left Ear: External ear normal.     Nose: Nose normal.     Mouth/Throat:     Pharynx: No oropharyngeal exudate.  Eyes:     General: No scleral icterus.       Right eye: No discharge.        Left eye: No discharge.     Conjunctiva/sclera: Conjunctivae normal.     Pupils: Pupils are equal, round, and reactive to light.  Neck:     Thyroid: No thyromegaly.     Vascular: No JVD.  Cardiovascular:     Rate and Rhythm: Normal rate and regular rhythm.     Heart sounds: Normal heart sounds. No murmur heard.    No  friction rub. No gallop.  Pulmonary:     Effort: Pulmonary effort is normal. No respiratory distress.     Breath sounds: Normal breath sounds. No wheezing or rales.  Chest:     Chest wall: No tenderness.  Abdominal:     General: Bowel sounds are normal. There is no distension.     Palpations: Abdomen is soft. There is no mass.     Tenderness: There is no abdominal tenderness. There is no guarding or rebound.  Musculoskeletal:     Cervical back: Normal range of motion and neck supple.  Lymphadenopathy:     Cervical: No cervical adenopathy.  Neurological:     Mental Status: He is alert. He is disoriented.     Cranial Nerves: No cranial nerve deficit.     Sensory: Sensation is intact.     Motor: Weakness and tremor present.     Coordination: Romberg sign positive. Coordination abnormal. Finger-Nose-Finger Test abnormal.     Gait: Gait abnormal.     Deep Tendon Reflexes: Reflexes are normal and symmetric.  Psychiatric:        Behavior: Behavior normal.        Thought Content: Thought content normal.        Judgment: Judgment normal.        Assessment & Plan:  Corticobasal degeneration (HCC) We try the patient on Sinemet 25/250 3 times daily almost 2 years ago.  At that time he saw very little benefit so he stopped the medication.  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  Therefore we will rechallenge the patient with Sinemet 25/250 3 times daily and reassess in 1 month.  I am also going to schedule the patient for physical  therapy at home.  He is extremely high fall risk.  Reassess in 1 month

## 2023-06-08 DIAGNOSIS — G3185 Corticobasal degeneration: Secondary | ICD-10-CM | POA: Diagnosis not present

## 2023-06-08 DIAGNOSIS — M4802 Spinal stenosis, cervical region: Secondary | ICD-10-CM | POA: Diagnosis not present

## 2023-06-08 DIAGNOSIS — I252 Old myocardial infarction: Secondary | ICD-10-CM | POA: Diagnosis not present

## 2023-06-08 DIAGNOSIS — I10 Essential (primary) hypertension: Secondary | ICD-10-CM | POA: Diagnosis not present

## 2023-06-08 DIAGNOSIS — Z87891 Personal history of nicotine dependence: Secondary | ICD-10-CM | POA: Diagnosis not present

## 2023-06-08 DIAGNOSIS — F0280 Dementia in other diseases classified elsewhere without behavioral disturbance: Secondary | ICD-10-CM | POA: Diagnosis not present

## 2023-06-08 DIAGNOSIS — Z556 Problems related to health literacy: Secondary | ICD-10-CM | POA: Diagnosis not present

## 2023-06-08 DIAGNOSIS — Z9181 History of falling: Secondary | ICD-10-CM | POA: Diagnosis not present

## 2023-06-08 DIAGNOSIS — E785 Hyperlipidemia, unspecified: Secondary | ICD-10-CM | POA: Diagnosis not present

## 2023-06-10 DIAGNOSIS — G3185 Corticobasal degeneration: Secondary | ICD-10-CM | POA: Diagnosis not present

## 2023-06-10 DIAGNOSIS — I10 Essential (primary) hypertension: Secondary | ICD-10-CM | POA: Diagnosis not present

## 2023-06-10 DIAGNOSIS — I252 Old myocardial infarction: Secondary | ICD-10-CM | POA: Diagnosis not present

## 2023-06-10 DIAGNOSIS — M4802 Spinal stenosis, cervical region: Secondary | ICD-10-CM | POA: Diagnosis not present

## 2023-06-10 DIAGNOSIS — E785 Hyperlipidemia, unspecified: Secondary | ICD-10-CM | POA: Diagnosis not present

## 2023-06-10 DIAGNOSIS — F0280 Dementia in other diseases classified elsewhere without behavioral disturbance: Secondary | ICD-10-CM | POA: Diagnosis not present

## 2023-06-15 DIAGNOSIS — I252 Old myocardial infarction: Secondary | ICD-10-CM | POA: Diagnosis not present

## 2023-06-15 DIAGNOSIS — G3185 Corticobasal degeneration: Secondary | ICD-10-CM | POA: Diagnosis not present

## 2023-06-15 DIAGNOSIS — I10 Essential (primary) hypertension: Secondary | ICD-10-CM | POA: Diagnosis not present

## 2023-06-15 DIAGNOSIS — E785 Hyperlipidemia, unspecified: Secondary | ICD-10-CM | POA: Diagnosis not present

## 2023-06-15 DIAGNOSIS — F0280 Dementia in other diseases classified elsewhere without behavioral disturbance: Secondary | ICD-10-CM | POA: Diagnosis not present

## 2023-06-15 DIAGNOSIS — M4802 Spinal stenosis, cervical region: Secondary | ICD-10-CM | POA: Diagnosis not present

## 2023-06-17 DIAGNOSIS — E785 Hyperlipidemia, unspecified: Secondary | ICD-10-CM | POA: Diagnosis not present

## 2023-06-17 DIAGNOSIS — I252 Old myocardial infarction: Secondary | ICD-10-CM | POA: Diagnosis not present

## 2023-06-17 DIAGNOSIS — G3185 Corticobasal degeneration: Secondary | ICD-10-CM | POA: Diagnosis not present

## 2023-06-17 DIAGNOSIS — F0280 Dementia in other diseases classified elsewhere without behavioral disturbance: Secondary | ICD-10-CM | POA: Diagnosis not present

## 2023-06-17 DIAGNOSIS — M4802 Spinal stenosis, cervical region: Secondary | ICD-10-CM | POA: Diagnosis not present

## 2023-06-17 DIAGNOSIS — I10 Essential (primary) hypertension: Secondary | ICD-10-CM | POA: Diagnosis not present

## 2023-06-18 ENCOUNTER — Telehealth: Payer: Self-pay | Admitting: Family Medicine

## 2023-06-18 NOTE — Telephone Encounter (Signed)
Received call from [facility] with [facility] to report the patient decided not to start taking the newly prescribed medication carbidopa-levodopa (SINEMET) 25-250 MG tablet  due to concerns of side effects. Drinda Butts advised patient to contact provider today to ask questions and resolve concerns. Drinda Butts stated patient showing increase in tremors and jerky motions.  Please advise Drinda Butts with questions [phone].

## 2023-06-24 DIAGNOSIS — I252 Old myocardial infarction: Secondary | ICD-10-CM | POA: Diagnosis not present

## 2023-06-24 DIAGNOSIS — I10 Essential (primary) hypertension: Secondary | ICD-10-CM | POA: Diagnosis not present

## 2023-06-24 DIAGNOSIS — E785 Hyperlipidemia, unspecified: Secondary | ICD-10-CM | POA: Diagnosis not present

## 2023-06-24 DIAGNOSIS — G3185 Corticobasal degeneration: Secondary | ICD-10-CM | POA: Diagnosis not present

## 2023-06-24 DIAGNOSIS — M4802 Spinal stenosis, cervical region: Secondary | ICD-10-CM | POA: Diagnosis not present

## 2023-06-24 DIAGNOSIS — F0280 Dementia in other diseases classified elsewhere without behavioral disturbance: Secondary | ICD-10-CM | POA: Diagnosis not present

## 2023-06-26 DIAGNOSIS — I10 Essential (primary) hypertension: Secondary | ICD-10-CM | POA: Diagnosis not present

## 2023-06-26 DIAGNOSIS — E782 Mixed hyperlipidemia: Secondary | ICD-10-CM | POA: Diagnosis not present

## 2023-06-26 DIAGNOSIS — R7303 Prediabetes: Secondary | ICD-10-CM | POA: Diagnosis not present

## 2023-06-26 DIAGNOSIS — I2510 Atherosclerotic heart disease of native coronary artery without angina pectoris: Secondary | ICD-10-CM | POA: Diagnosis not present

## 2023-07-02 DIAGNOSIS — F0280 Dementia in other diseases classified elsewhere without behavioral disturbance: Secondary | ICD-10-CM | POA: Diagnosis not present

## 2023-07-02 DIAGNOSIS — I252 Old myocardial infarction: Secondary | ICD-10-CM | POA: Diagnosis not present

## 2023-07-02 DIAGNOSIS — E785 Hyperlipidemia, unspecified: Secondary | ICD-10-CM | POA: Diagnosis not present

## 2023-07-02 DIAGNOSIS — I10 Essential (primary) hypertension: Secondary | ICD-10-CM | POA: Diagnosis not present

## 2023-07-02 DIAGNOSIS — M4802 Spinal stenosis, cervical region: Secondary | ICD-10-CM | POA: Diagnosis not present

## 2023-07-02 DIAGNOSIS — G3185 Corticobasal degeneration: Secondary | ICD-10-CM | POA: Diagnosis not present

## 2023-07-07 DIAGNOSIS — F0280 Dementia in other diseases classified elsewhere without behavioral disturbance: Secondary | ICD-10-CM | POA: Diagnosis not present

## 2023-07-07 DIAGNOSIS — M4802 Spinal stenosis, cervical region: Secondary | ICD-10-CM | POA: Diagnosis not present

## 2023-07-07 DIAGNOSIS — E785 Hyperlipidemia, unspecified: Secondary | ICD-10-CM | POA: Diagnosis not present

## 2023-07-07 DIAGNOSIS — G3185 Corticobasal degeneration: Secondary | ICD-10-CM | POA: Diagnosis not present

## 2023-07-07 DIAGNOSIS — I252 Old myocardial infarction: Secondary | ICD-10-CM | POA: Diagnosis not present

## 2023-07-07 DIAGNOSIS — I10 Essential (primary) hypertension: Secondary | ICD-10-CM | POA: Diagnosis not present

## 2023-07-08 DIAGNOSIS — I252 Old myocardial infarction: Secondary | ICD-10-CM | POA: Diagnosis not present

## 2023-07-08 DIAGNOSIS — F0280 Dementia in other diseases classified elsewhere without behavioral disturbance: Secondary | ICD-10-CM | POA: Diagnosis not present

## 2023-07-08 DIAGNOSIS — Z9181 History of falling: Secondary | ICD-10-CM | POA: Diagnosis not present

## 2023-07-08 DIAGNOSIS — Z87891 Personal history of nicotine dependence: Secondary | ICD-10-CM | POA: Diagnosis not present

## 2023-07-08 DIAGNOSIS — M4802 Spinal stenosis, cervical region: Secondary | ICD-10-CM | POA: Diagnosis not present

## 2023-07-08 DIAGNOSIS — I10 Essential (primary) hypertension: Secondary | ICD-10-CM | POA: Diagnosis not present

## 2023-07-08 DIAGNOSIS — E785 Hyperlipidemia, unspecified: Secondary | ICD-10-CM | POA: Diagnosis not present

## 2023-07-08 DIAGNOSIS — G3185 Corticobasal degeneration: Secondary | ICD-10-CM | POA: Diagnosis not present

## 2023-07-08 DIAGNOSIS — Z556 Problems related to health literacy: Secondary | ICD-10-CM | POA: Diagnosis not present

## 2023-07-15 DIAGNOSIS — M4802 Spinal stenosis, cervical region: Secondary | ICD-10-CM | POA: Diagnosis not present

## 2023-07-15 DIAGNOSIS — I252 Old myocardial infarction: Secondary | ICD-10-CM | POA: Diagnosis not present

## 2023-07-15 DIAGNOSIS — I10 Essential (primary) hypertension: Secondary | ICD-10-CM | POA: Diagnosis not present

## 2023-07-15 DIAGNOSIS — F0280 Dementia in other diseases classified elsewhere without behavioral disturbance: Secondary | ICD-10-CM | POA: Diagnosis not present

## 2023-07-15 DIAGNOSIS — E785 Hyperlipidemia, unspecified: Secondary | ICD-10-CM | POA: Diagnosis not present

## 2023-07-15 DIAGNOSIS — G3185 Corticobasal degeneration: Secondary | ICD-10-CM | POA: Diagnosis not present

## 2023-07-20 ENCOUNTER — Telehealth: Payer: Self-pay | Admitting: Family Medicine

## 2023-07-20 NOTE — Telephone Encounter (Signed)
Received call from [facility] with [facility]; stated he had a home visit last week. Patient's wife stated during the visit that the patient is no longer taking carbidopa-levodopa (SINEMET) 25-250 MG tablet [[id number]]  and hasn't taken it for the past 2 weeks due to side effects including diarrhea. Drinda Butts stated the patient experienced difficulty walking. She observed patient doesn't like to take a lot of meds and seems to prefer a holistic approach.  Please advise with questions at [phone]. Ok to leave a voicemail on this secured line.

## 2023-07-23 DIAGNOSIS — F0280 Dementia in other diseases classified elsewhere without behavioral disturbance: Secondary | ICD-10-CM | POA: Diagnosis not present

## 2023-07-23 DIAGNOSIS — E785 Hyperlipidemia, unspecified: Secondary | ICD-10-CM | POA: Diagnosis not present

## 2023-07-23 DIAGNOSIS — Z79899 Other long term (current) drug therapy: Secondary | ICD-10-CM | POA: Diagnosis not present

## 2023-07-23 DIAGNOSIS — I10 Essential (primary) hypertension: Secondary | ICD-10-CM | POA: Diagnosis not present

## 2023-07-23 DIAGNOSIS — I252 Old myocardial infarction: Secondary | ICD-10-CM | POA: Diagnosis not present

## 2023-07-23 DIAGNOSIS — R482 Apraxia: Secondary | ICD-10-CM | POA: Diagnosis not present

## 2023-07-23 DIAGNOSIS — G3185 Corticobasal degeneration: Secondary | ICD-10-CM | POA: Diagnosis not present

## 2023-07-23 DIAGNOSIS — R296 Repeated falls: Secondary | ICD-10-CM | POA: Diagnosis not present

## 2023-07-23 DIAGNOSIS — M4802 Spinal stenosis, cervical region: Secondary | ICD-10-CM | POA: Diagnosis not present

## 2023-07-23 DIAGNOSIS — G253 Myoclonus: Secondary | ICD-10-CM | POA: Diagnosis not present

## 2023-07-27 ENCOUNTER — Other Ambulatory Visit: Payer: Self-pay | Admitting: Student

## 2023-07-27 DIAGNOSIS — R296 Repeated falls: Secondary | ICD-10-CM

## 2023-07-28 DIAGNOSIS — F0280 Dementia in other diseases classified elsewhere without behavioral disturbance: Secondary | ICD-10-CM | POA: Diagnosis not present

## 2023-07-28 DIAGNOSIS — M4802 Spinal stenosis, cervical region: Secondary | ICD-10-CM | POA: Diagnosis not present

## 2023-07-28 DIAGNOSIS — I252 Old myocardial infarction: Secondary | ICD-10-CM | POA: Diagnosis not present

## 2023-07-28 DIAGNOSIS — I10 Essential (primary) hypertension: Secondary | ICD-10-CM | POA: Diagnosis not present

## 2023-07-28 DIAGNOSIS — G3185 Corticobasal degeneration: Secondary | ICD-10-CM | POA: Diagnosis not present

## 2023-07-28 DIAGNOSIS — E785 Hyperlipidemia, unspecified: Secondary | ICD-10-CM | POA: Diagnosis not present

## 2023-07-29 ENCOUNTER — Telehealth: Payer: Self-pay | Admitting: Family Medicine

## 2023-07-29 NOTE — Telephone Encounter (Signed)
Received call from Loraine Maple with [facility] to advise [place] the patient reported a fall to her which took place on Saturday, 07/25/23 while trying to take the garbage cans in. Patient also had another fall the previous week which the patient reported to his neurologist.  Drinda Butts stated the patient isn't taking the Parkinson's medication due to concerns he has which he reported to the neurologist. Drinda Butts advised the patient to speak with his pcp regarding those concerns. Reportedly the neurologist told the patient there are other medications he can take but the patient declined everything.  Please advise Drinda Butts with any questions at [phone].

## 2023-07-31 ENCOUNTER — Other Ambulatory Visit: Payer: Self-pay | Admitting: Family Medicine

## 2023-07-31 DIAGNOSIS — Z1212 Encounter for screening for malignant neoplasm of rectum: Secondary | ICD-10-CM

## 2023-07-31 DIAGNOSIS — Z1211 Encounter for screening for malignant neoplasm of colon: Secondary | ICD-10-CM

## 2023-08-03 DIAGNOSIS — G3185 Corticobasal degeneration: Secondary | ICD-10-CM | POA: Diagnosis not present

## 2023-08-03 DIAGNOSIS — M4802 Spinal stenosis, cervical region: Secondary | ICD-10-CM | POA: Diagnosis not present

## 2023-08-03 DIAGNOSIS — F0280 Dementia in other diseases classified elsewhere without behavioral disturbance: Secondary | ICD-10-CM | POA: Diagnosis not present

## 2023-08-03 DIAGNOSIS — I252 Old myocardial infarction: Secondary | ICD-10-CM | POA: Diagnosis not present

## 2023-08-03 DIAGNOSIS — E785 Hyperlipidemia, unspecified: Secondary | ICD-10-CM | POA: Diagnosis not present

## 2023-08-03 DIAGNOSIS — I10 Essential (primary) hypertension: Secondary | ICD-10-CM | POA: Diagnosis not present

## 2023-08-07 DIAGNOSIS — F0280 Dementia in other diseases classified elsewhere without behavioral disturbance: Secondary | ICD-10-CM | POA: Diagnosis not present

## 2023-08-07 DIAGNOSIS — Z556 Problems related to health literacy: Secondary | ICD-10-CM | POA: Diagnosis not present

## 2023-08-07 DIAGNOSIS — I10 Essential (primary) hypertension: Secondary | ICD-10-CM | POA: Diagnosis not present

## 2023-08-07 DIAGNOSIS — Z87891 Personal history of nicotine dependence: Secondary | ICD-10-CM | POA: Diagnosis not present

## 2023-08-07 DIAGNOSIS — I252 Old myocardial infarction: Secondary | ICD-10-CM | POA: Diagnosis not present

## 2023-08-07 DIAGNOSIS — E785 Hyperlipidemia, unspecified: Secondary | ICD-10-CM | POA: Diagnosis not present

## 2023-08-07 DIAGNOSIS — Z9181 History of falling: Secondary | ICD-10-CM | POA: Diagnosis not present

## 2023-08-07 DIAGNOSIS — G3185 Corticobasal degeneration: Secondary | ICD-10-CM | POA: Diagnosis not present

## 2023-08-07 DIAGNOSIS — M4802 Spinal stenosis, cervical region: Secondary | ICD-10-CM | POA: Diagnosis not present

## 2023-08-11 ENCOUNTER — Ambulatory Visit
Admission: RE | Admit: 2023-08-11 | Discharge: 2023-08-11 | Disposition: A | Discharge status: Home / Self Care | Payer: Medicare Other | Source: Ambulatory Visit | Attending: Student | Admitting: Student

## 2023-08-11 DIAGNOSIS — R296 Repeated falls: Secondary | ICD-10-CM | POA: Diagnosis not present

## 2023-08-11 DIAGNOSIS — G20C Parkinsonism, unspecified: Secondary | ICD-10-CM | POA: Diagnosis not present

## 2023-08-11 DIAGNOSIS — G9389 Other specified disorders of brain: Secondary | ICD-10-CM | POA: Diagnosis not present

## 2023-08-13 DIAGNOSIS — I10 Essential (primary) hypertension: Secondary | ICD-10-CM | POA: Diagnosis not present

## 2023-08-13 DIAGNOSIS — M4802 Spinal stenosis, cervical region: Secondary | ICD-10-CM | POA: Diagnosis not present

## 2023-08-13 DIAGNOSIS — I252 Old myocardial infarction: Secondary | ICD-10-CM | POA: Diagnosis not present

## 2023-08-13 DIAGNOSIS — E785 Hyperlipidemia, unspecified: Secondary | ICD-10-CM | POA: Diagnosis not present

## 2023-08-13 DIAGNOSIS — F0280 Dementia in other diseases classified elsewhere without behavioral disturbance: Secondary | ICD-10-CM | POA: Diagnosis not present

## 2023-08-13 DIAGNOSIS — G3185 Corticobasal degeneration: Secondary | ICD-10-CM | POA: Diagnosis not present

## 2023-08-18 DIAGNOSIS — F0280 Dementia in other diseases classified elsewhere without behavioral disturbance: Secondary | ICD-10-CM | POA: Diagnosis not present

## 2023-08-18 DIAGNOSIS — I10 Essential (primary) hypertension: Secondary | ICD-10-CM | POA: Diagnosis not present

## 2023-08-18 DIAGNOSIS — M4802 Spinal stenosis, cervical region: Secondary | ICD-10-CM | POA: Diagnosis not present

## 2023-08-18 DIAGNOSIS — G3185 Corticobasal degeneration: Secondary | ICD-10-CM | POA: Diagnosis not present

## 2023-08-18 DIAGNOSIS — E785 Hyperlipidemia, unspecified: Secondary | ICD-10-CM | POA: Diagnosis not present

## 2023-08-18 DIAGNOSIS — I252 Old myocardial infarction: Secondary | ICD-10-CM | POA: Diagnosis not present

## 2023-08-19 ENCOUNTER — Telehealth: Payer: Self-pay

## 2023-08-19 NOTE — Telephone Encounter (Signed)
Pt's wife called in stating that pt is having some diarrhea. Pt 's wife wanted to speak with nurse about what pt may be able to take to help with this please. Please advise.  Cb#: [phone]

## 2023-08-20 NOTE — Telephone Encounter (Signed)
Pt's wife called in again to speak with nurse. Pt's wife stated that pt is still having issues with diarrhea. Pt's wife was offered an appt time for 08/21/23, but wanted to speak with nurse before taking this appt. Please advise.  Cb#:  [phone]

## 2023-08-25 DIAGNOSIS — I10 Essential (primary) hypertension: Secondary | ICD-10-CM | POA: Diagnosis not present

## 2023-08-25 DIAGNOSIS — E785 Hyperlipidemia, unspecified: Secondary | ICD-10-CM | POA: Diagnosis not present

## 2023-08-25 DIAGNOSIS — I252 Old myocardial infarction: Secondary | ICD-10-CM | POA: Diagnosis not present

## 2023-08-25 DIAGNOSIS — M4802 Spinal stenosis, cervical region: Secondary | ICD-10-CM | POA: Diagnosis not present

## 2023-08-25 DIAGNOSIS — G3185 Corticobasal degeneration: Secondary | ICD-10-CM | POA: Diagnosis not present

## 2023-08-25 DIAGNOSIS — F0280 Dementia in other diseases classified elsewhere without behavioral disturbance: Secondary | ICD-10-CM | POA: Diagnosis not present

## 2023-09-01 ENCOUNTER — Ambulatory Visit (INDEPENDENT_AMBULATORY_CARE_PROVIDER_SITE_OTHER): Payer: Medicare Other | Admitting: Family Medicine

## 2023-09-01 ENCOUNTER — Encounter: Payer: Self-pay | Admitting: Family Medicine

## 2023-09-01 VITALS — BP 120/72 | HR 75 | Temp 97.5°F | Ht 68.0 in | Wt 146.0 lb

## 2023-09-01 DIAGNOSIS — G3185 Corticobasal degeneration: Secondary | ICD-10-CM | POA: Diagnosis not present

## 2023-09-01 DIAGNOSIS — E785 Hyperlipidemia, unspecified: Secondary | ICD-10-CM | POA: Diagnosis not present

## 2023-09-01 DIAGNOSIS — M4802 Spinal stenosis, cervical region: Secondary | ICD-10-CM | POA: Diagnosis not present

## 2023-09-01 DIAGNOSIS — F03B0 Unspecified dementia, moderate, without behavioral disturbance, psychotic disturbance, mood disturbance, and anxiety: Secondary | ICD-10-CM | POA: Diagnosis not present

## 2023-09-01 DIAGNOSIS — I252 Old myocardial infarction: Secondary | ICD-10-CM | POA: Diagnosis not present

## 2023-09-01 DIAGNOSIS — F0280 Dementia in other diseases classified elsewhere without behavioral disturbance: Secondary | ICD-10-CM | POA: Diagnosis not present

## 2023-09-01 DIAGNOSIS — I10 Essential (primary) hypertension: Secondary | ICD-10-CM | POA: Diagnosis not present

## 2023-09-01 DIAGNOSIS — Z23 Encounter for immunization: Secondary | ICD-10-CM

## 2023-09-01 MED ORDER — ROPINIROLE HCL 0.25 MG PO TABS: 0.2500 mg | ORAL_TABLET | Freq: Three times a day (TID) | ORAL | 1 refills | Status: DC

## 2023-09-01 NOTE — Progress Notes (Signed)
Subjective:    Patient ID: Jay Rivas, male    DOB: 21-Aug-1951, 72 y.o.   MRN: [id number]   06/01/23 I have not seen the patient in the last 16 months.  However since my last visit with him, there is been significant progression.  Today he is essentially nonverbal.  He has an extremely difficult time following commands.  I tried to perform finger-to-nose testing with the patient and he was unable to follow the commands to perform the testing.  He also had a difficult time on his neurologic exam following the flashlight with his eyes.  I performed a Mini-Mental status exam.  The patient could not tell me my name or location.  He cannot tell me the year or the date.  He knew that I was his doctor but that was the extent of his memory.  He was only able to repeat 1 word out of 3 on recall and that occurred immediately after.  He could not even begin to attempt serial sevens or spelling world in reverse.  Therefore the dementia has progressed substantially.  His wife is concerned because he has been falling recently.  Today on exam, the patient has a pronounced slow shuffling gait.  He has very little arm swing.  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  He has a Patent examiner with significant bradykinesia.  At that time, my plan was: We tried the patient on Sinemet 25/250 3 times daily almost 2 years ago.  At that time he saw very little benefit so he stopped the medication.  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  Therefore we will rechallenge the patient with Sinemet 25/250 3 times daily and reassess in 1 month.  I am also going to schedule the patient for physical therapy at home.  He is extremely high fall risk.  Reassess in 1 month  09/01/23 Wt Readings from Last 3 Encounters:  09/01/23 146 lb (66.2 kg)  06/01/23 147 lb 12.8 oz (67 kg)  01/09/22 158 lb (71.7 kg)   Patient took the Sinemet for approximately 2 weeks.  However he  developed GI upset and stopped the medication.  His wife states that she was not aware of any benefit medication with regards to deformity of his treatment, his balance, or his pill-rolling tremor.  In October, the patient experienced a fall while trying to pull a garbage can the road.  Thankfully he did not suffer any injuries.  Patient today does not speak.  He questions only with a yes or no.  When I asked questions about his review of systems he is unable to elaborate.  His wife provides most of the history.  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  Patient's current [facility] is no source of memory loss.  We tried Namenda in the past but the patient did not tolerate the medication.  He also has myoclonic jerks of his right upper extremity.  He tried Depakote as well as Keppra but these seem to exacerbate his memory loss and cognitive abilities.  With regards to his review of systems, I asked the patient and his wife what is his biggest problem.  Their biggest concern is his unsteadiness on his feet, his falling, his rigidity, as well as the jerking movements in his right upper extremity tremor in his right hand.  He denies depression.  He denies chest pain or shortness of breath.  He denies delusions or hallucinations.     Past Medical History:  Diagnosis Date   Carpal tunnel syndrome on both sides    Dementia (HCC)    diagnosed in jan 2019, pt states medication seems to be helping, A&Ox4   HLD (hyperlipidemia)    Hypertension    Memory disorder 11/24/2017   Myocardial infarction [facility])    Dr. Sharyn Lull   Spinal stenosis of cervical region    Past Surgical History:  Procedure Laterality Date   ABDOMINAL SURGERY     CARDIAC CATHETERIZATION  2005   stent   CARPAL TUNNEL RELEASE Right    CARPAL TUNNEL RELEASE Right 03/09/2018   Procedure: RIGHT CARPAL TUNNEL RELEASE;  Surgeon: Cindee Salt, MD;  Location: [facility];  Service: Orthopedics;   Laterality: Right;   CARPAL TUNNEL RELEASE Left 11/02/2018   Procedure: LEFT CARPAL TUNNEL RELEASE;  Surgeon: Cindee Salt, MD;  Location: [facility];  Service: Orthopedics;  Laterality: Left;   TENDON TRANSFER Left 11/02/2018   Procedure: LEFT CAMITZ TRANSFER;  Surgeon: Cindee Salt, MD;  Location: [facility];  Service: Orthopedics;  Laterality: Left;   TRIGGER FINGER RELEASE Right 03/09/2018   Procedure: RELEASE TRIGGER FINGER/A-1 PULLEY RIGHT MIDDLE, RING AND SMALL FINGERS;  Surgeon: Cindee Salt, MD;  Location: [facility];  Service: Orthopedics;  Laterality: Right;   ULNAR NERVE TRANSPOSITION Left 11/02/2018   Procedure: LEFT ULNAR NERVE DECOMPRESSION;  Surgeon: Cindee Salt, MD;  Location: [facility];  Service: Orthopedics;  Laterality: Left;   ULNAR TUNNEL RELEASE Right 03/09/2018   Procedure: CUBITAL TUNNEL DECOMPRESSION RIGHT ELBOW;  Surgeon: Cindee Salt, MD;  Location: [facility];  Service: Orthopedics;  Laterality: Right;   Current Outpatient Medications on File Prior to Visit  Medication Sig Dispense Refill   aspirin EC 81 MG tablet Take 81 mg by mouth daily.     Coconut Oil 1000 MG CAPS Take by mouth.     Coenzyme Q10 (COQ-10) 200 MG CAPS Take by mouth.     donepezil (ARICEPT) 10 MG tablet TAKE 1 TABLET BY MOUTH EVERYDAY AT BEDTIME 90 tablet 3   ketoconazole (NIZORAL) 2 % shampoo SMARTSIG:Topical 2-3 Times Weekly     losartan (COZAAR) 100 MG tablet Take 100 mg by mouth daily.     Omega-3 Fatty Acids (FISH OIL) 1000 MG CAPS Take by mouth.     carbidopa-levodopa (SINEMET) 25-250 MG tablet Take 1 tablet by mouth 3 (three) times daily. (Patient not taking: Reported on 09/01/2023) 90 tablet 3   hydrocortisone 2.5 % lotion Apply topically. (Patient not taking: Reported on 09/01/2023)     No current facility-administered medications on file prior to visit.   Allergies  Allergen Reactions   Penicillin G Anaphylaxis    Social History   Socioeconomic History   Marital status: Married    Spouse name: Brayton Caves   Number of children: 2   Years of education: Not on file   Highest education level: Not on file  Occupational History   Not on file  Tobacco Use   Smoking status: Former   Smokeless tobacco: Former   Tobacco comments:    quit 1990  Substance and Sexual Activity   Alcohol use: Yes    Alcohol/week: 24.0 standard drinks of alcohol    Types: 24 Cans of beer per week    Comment: social beer only   Drug use: No   Sexual activity: Yes  Other Topics Concern  Not on file  Social History Narrative   Lives with wife, Brayton Caves.    1 son and 1 daughter.   2 grandchildren.   Right handed    Social Determinants of Health   Financial Resource Strain: Low Risk  (12/12/2021)   Overall Financial Resource Strain (CARDIA)    Difficulty of Paying Living Expenses: Not hard at all  Food Insecurity: No Food Insecurity (01/03/2022)   Hunger Vital Sign    Worried About Running Out of Food in the Last Year: Never true    Ran Out of Food in the Last Year: Never true  Transportation Needs: No Transportation Needs (01/03/2022)   PRAPARE - Administrator, Civil Service (Medical): No    Lack of Transportation (Non-Medical): No  Physical Activity: Insufficiently Active (12/12/2021)   Exercise Vital Sign    Days of Exercise per Week: 3 days    Minutes of Exercise per Session: 20 min  Stress: No Stress Concern Present (12/12/2021)   [employer] of Occupational Health - Occupational Stress Questionnaire    Feeling of Stress : Not at all  Social Connections: Socially Integrated (12/12/2021)   Social Connection and Isolation Panel [NHANES]    Frequency of Communication with Friends and Family: More than three times a week    Frequency of Social Gatherings with Friends and Family: More than three times a week    Attends Religious Services: 1 to 4 times per year    Active Member of [employer] or  Organizations: Yes    Attends Banker Meetings: 1 to 4 times per year    Marital Status: Married  Catering manager Violence: Not At Risk (12/12/2021)   Humiliation, Afraid, Rape, and Kick questionnaire    Fear of Current or Ex-Partner: No    Emotionally Abused: No    Physically Abused: No    Sexually Abused: No   No family history on file.     Review of Systems  All other systems reviewed and are negative.      Objective:   Physical Exam Vitals reviewed.  Constitutional:      General: He is not in acute distress.    Appearance: He is well-developed. He is not diaphoretic.  HENT:     Head: Normocephalic and atraumatic.     Right Ear: External ear normal.     Left Ear: External ear normal.     Nose: Nose normal.     Mouth/Throat:     Pharynx: No oropharyngeal exudate.  Eyes:     General: No scleral icterus.       Right eye: No discharge.        Left eye: No discharge.     Conjunctiva/sclera: Conjunctivae normal.     Pupils: Pupils are equal, round, and reactive to light.  Neck:     Thyroid: No thyromegaly.     Vascular: No JVD.  Cardiovascular:     Rate and Rhythm: Normal rate and regular rhythm.     Heart sounds: Normal heart sounds. No murmur heard.    No friction rub. No gallop.  Pulmonary:     Effort: Pulmonary effort is normal. No respiratory distress.     Breath sounds: Normal breath sounds. No wheezing or rales.  Chest:     Chest wall: No tenderness.  Abdominal:     General: Bowel sounds are normal. There is no distension.     Palpations: Abdomen is soft. There is no mass.  Tenderness: There is no abdominal tenderness. There is no guarding or rebound.  Musculoskeletal:     Cervical back: Normal range of motion and neck supple.  Lymphadenopathy:     Cervical: No cervical adenopathy.  Neurological:     Mental Status: He is alert. He is disoriented.     Cranial Nerves: No cranial nerve deficit.     Sensory: Sensation is intact.      Motor: Weakness and tremor present.     Coordination: Romberg sign positive. Coordination abnormal. Finger-Nose-Finger Test abnormal.     Gait: Gait abnormal.     Deep Tendon Reflexes: Reflexes are normal and symmetric.  Psychiatric:        Behavior: Behavior normal.        Thought Content: Thought content normal.        Judgment: Judgment normal.   Patient very stiff in his upper and lower extremities.  He has a cogwheel rigidity in both arms.  Right arm is much worse than left arm.  He also has a difficult time following commands.     Assessment & Plan:  Corticobasal degeneration (HCC) - Plan: CBC with Differential/Platelet, COMPLETE METABOLIC PANEL WITH GFR  Moderate dementia without behavioral disturbance, psychotic disturbance, mood disturbance, or anxiety, unspecified dementia type (HCC) My heart goes out to this family.  I do not know what else to do to help the situation.  We discussed trying Requip 0.25 mg p.o. 3 times daily off label.  I explained to the patient that this is only to try to help his movement.  It will not help with memory loss.  I am trying to improve his stiffness and rigidity.  I am trying to improve his tremor.  I am trying to improve his balance and his fluidity of motion.  I cautioned them to monitor for any evidence of cognitive decline or hallucinations.  Reassess via telephone in 1 month.  Patient received his flu shot today.  I did recommend increasing his fiber to 2 capsules a day as this seems to have improved his loose stools dramatically.  He is now only experiencing 1 mushy stool per day.

## 2023-09-01 NOTE — Addendum Note (Signed)
Addended by: Venia Carbon K on: 09/01/2023 01:10 PM   Modules accepted: Orders

## 2023-09-02 LAB — COMPLETE METABOLIC PANEL WITH GFR
AG Ratio: 1.8 (calc) (ref 1.0–2.5)
ALT: 12 U/L (ref 9–46)
AST: 12 U/L (ref 10–35)
Albumin: 4.3 g/dL (ref 3.6–5.1)
Alkaline phosphatase (APISO): 76 U/L (ref 35–144)
BUN: 14 mg/dL (ref 7–25)
CO2: 30 mmol/L (ref 20–32)
Calcium: 9.6 mg/dL (ref 8.6–10.3)
Chloride: 105 mmol/L (ref 98–110)
Creat: 1.02 mg/dL (ref 0.70–1.28)
Globulin: 2.4 g/dL (ref 1.9–3.7)
Glucose, Bld: 101 mg/dL — ABNORMAL HIGH (ref 65–99)
Potassium: 3.8 mmol/L (ref 3.5–5.3)
Sodium: 141 mmol/L (ref 135–146)
Total Bilirubin: 0.7 mg/dL (ref 0.2–1.2)
Total Protein: 6.7 g/dL (ref 6.1–8.1)
eGFR: 79 mL/min/{1.73_m2} (ref >=60)

## 2023-09-02 LAB — CBC WITH DIFFERENTIAL/PLATELET
Absolute Lymphocytes: 954 {cells}/uL (ref 850–3900)
Absolute Monocytes: 429 {cells}/uL (ref 200–950)
Basophils Absolute: 58 {cells}/uL (ref 0–200)
Basophils Relative: 1.1 %
Eosinophils Absolute: 329 {cells}/uL (ref 15–500)
Eosinophils Relative: 6.2 %
HCT: 36.2 % — ABNORMAL LOW (ref 38.5–50.0)
Hemoglobin: 12.3 g/dL — ABNORMAL LOW (ref 13.2–17.1)
MCH: 30.8 pg (ref 27.0–33.0)
MCHC: 34 g/dL (ref 32.0–36.0)
MCV: 90.5 fL (ref 80.0–100.0)
MPV: 9.5 fL (ref 7.5–12.5)
Monocytes Relative: 8.1 %
Neutro Abs: 3530 {cells}/uL (ref 1500–7800)
Neutrophils Relative %: 66.6 %
Platelets: 228 10*3/uL (ref 140–400)
RBC: 4 10*6/uL — ABNORMAL LOW (ref 4.20–5.80)
RDW: 12.5 % (ref 11.0–15.0)
Total Lymphocyte: 18 %
WBC: 5.3 10*3/uL (ref 3.8–10.8)

## 2023-09-03 ENCOUNTER — Other Ambulatory Visit: Payer: Self-pay | Admitting: Family Medicine

## 2023-09-03 NOTE — Telephone Encounter (Signed)
Requested Prescriptions  Refused Prescriptions Disp Refills   carbidopa-levodopa (SINEMET IR) 25-250 MG tablet [Pharmacy Med Name: CARBIDOPA-LEVODOPA 25-250 TAB] 270 tablet 1    Sig: TAKE 1 TABLET BY MOUTH THREE TIMES A DAY     Neurology:  Parkinsonian Agents 2 Failed - 09/03/2023  1:31 AM      Failed - HGB in normal range and within 360 days    Hemoglobin  Date Value Ref Range Status  09/01/2023 12.3 (L) 13.2 - 17.1 g/dL Final   HGB  Date Value Ref Range Status  11/30/2013 14.5 13.0 - 18.0 g/dL Final         Failed - HCT in normal range and within 360 days    HCT  Date Value Ref Range Status  09/01/2023 36.2 (L) 38.5 - 50.0 % Final  11/30/2013 42.4 40.0 - 52.0 % Final         Failed - Valid encounter within last 12 months    Recent Outpatient Visits           1 year ago Neuropathy   [facility] Donita Brooks, MD   2 years ago Prostate cancer screening   [facility] Donita Brooks, MD   3 years ago Neuropathy   [facility] Donita Brooks, MD   3 years ago Dementia without behavioral disturbance, unspecified dementia type (HCC)   [facility] Pickard, Priscille Heidelberg, MD   4 years ago Dementia without behavioral disturbance, unspecified dementia type (HCC)   [facility] Pickard, Priscille Heidelberg, MD              Passed - WBC in normal range and within 360 days    WBC  Date Value Ref Range Status  09/01/2023 5.3 3.8 - 10.8 Thousand/uL Final         Passed - PLT in normal range and within 360 days    Platelets  Date Value Ref Range Status  09/01/2023 228 140 - 400 Thousand/uL Final   Platelet  Date Value Ref Range Status  11/30/2013 171 150 - 440 x10 3/mm 3 Final         Passed - Cr in normal range and within 360 days    Creat  Date Value Ref Range Status  09/01/2023 1.02 0.70 - 1.28 mg/dL Final         Passed - AST in normal range and within 360 days    AST  Date Value Ref  Range Status  09/01/2023 12 10 - 35 U/L Final         Passed - ALT in normal range and within 360 days    ALT  Date Value Ref Range Status  09/01/2023 12 9 - 46 U/L Final         Passed - Last BP in normal range    BP Readings from Last 1 Encounters:  09/01/23 120/72

## 2023-09-06 DIAGNOSIS — E785 Hyperlipidemia, unspecified: Secondary | ICD-10-CM | POA: Diagnosis not present

## 2023-09-06 DIAGNOSIS — Z556 Problems related to health literacy: Secondary | ICD-10-CM | POA: Diagnosis not present

## 2023-09-06 DIAGNOSIS — I10 Essential (primary) hypertension: Secondary | ICD-10-CM | POA: Diagnosis not present

## 2023-09-06 DIAGNOSIS — M4802 Spinal stenosis, cervical region: Secondary | ICD-10-CM | POA: Diagnosis not present

## 2023-09-06 DIAGNOSIS — G3185 Corticobasal degeneration: Secondary | ICD-10-CM | POA: Diagnosis not present

## 2023-09-06 DIAGNOSIS — I252 Old myocardial infarction: Secondary | ICD-10-CM | POA: Diagnosis not present

## 2023-09-06 DIAGNOSIS — Z9181 History of falling: Secondary | ICD-10-CM | POA: Diagnosis not present

## 2023-09-06 DIAGNOSIS — Z87891 Personal history of nicotine dependence: Secondary | ICD-10-CM | POA: Diagnosis not present

## 2023-09-06 DIAGNOSIS — F0280 Dementia in other diseases classified elsewhere without behavioral disturbance: Secondary | ICD-10-CM | POA: Diagnosis not present

## 2023-09-08 DIAGNOSIS — I252 Old myocardial infarction: Secondary | ICD-10-CM | POA: Diagnosis not present

## 2023-09-08 DIAGNOSIS — F0280 Dementia in other diseases classified elsewhere without behavioral disturbance: Secondary | ICD-10-CM | POA: Diagnosis not present

## 2023-09-08 DIAGNOSIS — M4802 Spinal stenosis, cervical region: Secondary | ICD-10-CM | POA: Diagnosis not present

## 2023-09-08 DIAGNOSIS — E785 Hyperlipidemia, unspecified: Secondary | ICD-10-CM | POA: Diagnosis not present

## 2023-09-08 DIAGNOSIS — I10 Essential (primary) hypertension: Secondary | ICD-10-CM | POA: Diagnosis not present

## 2023-09-08 DIAGNOSIS — G3185 Corticobasal degeneration: Secondary | ICD-10-CM | POA: Diagnosis not present

## 2023-09-16 DIAGNOSIS — G3185 Corticobasal degeneration: Secondary | ICD-10-CM | POA: Diagnosis not present

## 2023-09-16 DIAGNOSIS — I252 Old myocardial infarction: Secondary | ICD-10-CM | POA: Diagnosis not present

## 2023-09-16 DIAGNOSIS — E785 Hyperlipidemia, unspecified: Secondary | ICD-10-CM | POA: Diagnosis not present

## 2023-09-16 DIAGNOSIS — F0280 Dementia in other diseases classified elsewhere without behavioral disturbance: Secondary | ICD-10-CM | POA: Diagnosis not present

## 2023-09-16 DIAGNOSIS — M4802 Spinal stenosis, cervical region: Secondary | ICD-10-CM | POA: Diagnosis not present

## 2023-09-16 DIAGNOSIS — I10 Essential (primary) hypertension: Secondary | ICD-10-CM | POA: Diagnosis not present

## 2023-09-26 ENCOUNTER — Other Ambulatory Visit: Payer: Self-pay | Admitting: Family Medicine

## 2023-09-29 NOTE — Telephone Encounter (Signed)
Requested medication (s) are due for refill today: review below  Requested medication (s) are on the active medication list: yes  Last refill:  09/01/23 #90/1  Future visit scheduled: no  Notes to clinic:  Pharmacy comment: REQUEST FOR 90 DAYS PRESCRIPTION.      Requested Prescriptions  Pending Prescriptions Disp Refills   rOPINIRole (REQUIP) 0.25 MG tablet [Pharmacy Med Name: ROPINIROLE HCL 0.25 MG TABLET] 270 tablet 1    Sig: TAKE 1 TABLET BY MOUTH 3 TIMES DAILY.     Neurology:  Parkinsonian Agents Failed - 09/26/2023  9:31 AM      Failed - Valid encounter within last 12 months    Recent Outpatient Visits           1 year ago Neuropathy   [facility] Tanya Nones, Priscille Heidelberg, MD   2 years ago Prostate cancer screening   [facility] Tanya Nones, Priscille Heidelberg, MD   3 years ago Neuropathy   [facility] Donita Brooks, MD   3 years ago Dementia without behavioral disturbance, unspecified dementia type [facility])   [facility] Donita Brooks, MD   4 years ago Dementia without behavioral disturbance, unspecified dementia type [facility])   [facility] Pickard, Priscille Heidelberg, MD              Passed - Last BP in normal range    BP Readings from Last 1 Encounters:  09/01/23 120/72         Passed - Last Heart Rate in normal range    Pulse Readings from Last 1 Encounters:  09/01/23 75

## 2023-09-30 DIAGNOSIS — E785 Hyperlipidemia, unspecified: Secondary | ICD-10-CM | POA: Diagnosis not present

## 2023-09-30 DIAGNOSIS — I2510 Atherosclerotic heart disease of native coronary artery without angina pectoris: Secondary | ICD-10-CM | POA: Diagnosis not present

## 2023-09-30 DIAGNOSIS — I10 Essential (primary) hypertension: Secondary | ICD-10-CM | POA: Diagnosis not present

## 2023-09-30 DIAGNOSIS — R7303 Prediabetes: Secondary | ICD-10-CM | POA: Diagnosis not present

## 2023-10-01 ENCOUNTER — Telehealth: Payer: Self-pay | Admitting: *Deleted

## 2023-10-01 DIAGNOSIS — I10 Essential (primary) hypertension: Secondary | ICD-10-CM | POA: Diagnosis not present

## 2023-10-01 DIAGNOSIS — M4802 Spinal stenosis, cervical region: Secondary | ICD-10-CM | POA: Diagnosis not present

## 2023-10-01 DIAGNOSIS — G3185 Corticobasal degeneration: Secondary | ICD-10-CM | POA: Diagnosis not present

## 2023-10-01 DIAGNOSIS — I252 Old myocardial infarction: Secondary | ICD-10-CM | POA: Diagnosis not present

## 2023-10-01 DIAGNOSIS — E785 Hyperlipidemia, unspecified: Secondary | ICD-10-CM | POA: Diagnosis not present

## 2023-10-01 DIAGNOSIS — F0280 Dementia in other diseases classified elsewhere without behavioral disturbance: Secondary | ICD-10-CM | POA: Diagnosis not present

## 2023-10-01 NOTE — Telephone Encounter (Signed)
 Copied from CRM [id number]. Topic: Clinical - Home Health Verbal Orders >> Oct 01, 2023 10:25 AM Louie Boston wrote: Caller/Agency: Chris/[facility] Callback Number: [phone] Service Requested: Physical Therapy Frequency: 1 x week for 9 weeks maintenance visits Any new concerns about the patient? No

## 2023-10-02 DIAGNOSIS — I10 Essential (primary) hypertension: Secondary | ICD-10-CM | POA: Diagnosis not present

## 2023-10-02 DIAGNOSIS — I2510 Atherosclerotic heart disease of native coronary artery without angina pectoris: Secondary | ICD-10-CM | POA: Diagnosis not present

## 2023-10-02 DIAGNOSIS — R7303 Prediabetes: Secondary | ICD-10-CM | POA: Diagnosis not present

## 2023-10-02 DIAGNOSIS — E782 Mixed hyperlipidemia: Secondary | ICD-10-CM | POA: Diagnosis not present

## 2023-10-14 ENCOUNTER — Telehealth: Payer: Self-pay

## 2023-10-14 NOTE — Telephone Encounter (Signed)
Copied from CRM [id number]. Topic: General - Other >> Oct 14, 2023  8:41 AM Fonda Kinder J wrote: Reason for CRM: Pt's Physical therapist called in because pt has been very congested with a bad cough. It has been going on for about a week and the Physical Therapist urged the pt to schedule an appointment but the pt decided against. Physical therapist said that she had to call and report it to the pt's PCP, and urged the clinic to follow up with her if there are any questions or concerns: Drinda Butts [phone]

## 2023-10-14 NOTE — Telephone Encounter (Signed)
Copied from CRM [id number]. Topic: General - Other >> Oct 14, 2023  8:34 AM Fonda Kinder J wrote: Reason for CRM: Pt's Physical Therapist called in to ask if PCP could send in an order for pt to get a transportation wheel chair with "4 small wheels" Physical Therapist said that you can follow up with her at [phone], Drinda Butts

## 2023-10-14 NOTE — Telephone Encounter (Signed)
Copied from CRM [id number]. Topic: Clinical - Medication Question >> Oct 14, 2023  8:31 AM Fonda Kinder J wrote: Reason for CRM: Pt's Physical Therapist called in to notify pt's PCP that he has not been taking rOPINIRole (REQUIP), pt's family was worried it would make him tired so they decided against it. Physical therapist just wanted pt's PCP to be aware and also states that she doesn't recommend any new medication

## 2023-10-15 ENCOUNTER — Other Ambulatory Visit: Payer: Self-pay | Admitting: Family Medicine

## 2023-10-27 ENCOUNTER — Telehealth: Payer: Self-pay

## 2023-10-27 NOTE — Telephone Encounter (Signed)
 Copied from CRM [id number]. Topic: Clinical - Prescription Issue >> Oct 27, 2023 10:22 AM Montie POUR wrote: Reason for CRM: Lenward with [facility] called back abou the order for the transportation wheel chair with 4 small wheels . Please send order to [employer] and they will deliver to patient. Please call [phone], Lenward with any questions. Thanks

## 2023-10-29 ENCOUNTER — Other Ambulatory Visit (INDEPENDENT_AMBULATORY_CARE_PROVIDER_SITE_OTHER): Payer: Self-pay

## 2023-10-29 DIAGNOSIS — F03B0 Unspecified dementia, moderate, without behavioral disturbance, psychotic disturbance, mood disturbance, and anxiety: Secondary | ICD-10-CM

## 2023-10-29 DIAGNOSIS — G3185 Corticobasal degeneration: Secondary | ICD-10-CM

## 2023-10-29 DIAGNOSIS — G629 Polyneuropathy, unspecified: Secondary | ICD-10-CM

## 2023-10-29 DIAGNOSIS — R269 Unspecified abnormalities of gait and mobility: Secondary | ICD-10-CM

## 2023-10-30 ENCOUNTER — Telehealth: Payer: Self-pay

## 2023-10-30 NOTE — Telephone Encounter (Signed)
 Updated order and information faxed to [facility] at [employer]. Mjp,lpn  Copied from CRM [id number]. Topic: Referral - Status >> Oct 29, 2023  3:19 PM Graeme ORN wrote: Reason for CRM: [facility] from [employer] called about order received for Transport chair for patient. States unable to make out signature. Provider will need to resubmit order to include NPI number. Should also include notes that back reason for request. Pt not currently in the system will need to include demographic info like address and insurance. Need to state that there is a caregiver or someone available to help patient propel chair. Phone: [phone]  Fax: [phone]  Thank You >> Oct 29, 2023  4:55 PM Delon NOVAK wrote: Merchant [employer] Officer and also routed to nurse as a telephone message.

## 2023-11-20 ENCOUNTER — Telehealth: Payer: Self-pay

## 2023-11-20 NOTE — Telephone Encounter (Signed)
Pls see pt msg and advise?

## 2023-11-20 NOTE — Telephone Encounter (Signed)
Copied from CRM [id number]. Topic: Clinical - Medical Advice >> Nov 20, 2023  3:20 PM Suzette B wrote: Reason for CRM: Patient's spouse called in stating that she needs some advice on how to began caring for the patient being his health is depleting patient states she wasn't sure if she should bring husband in or can someone sit and talk to her and a family member because the patient is not getting any better and really needs some assistance in caring for him.

## 2023-11-20 NOTE — Telephone Encounter (Signed)
 Copied from CRM [id number]. Topic: Clinical - Medical Advice >> Nov 20, 2023  3:20 PM Suzette B wrote: Reason for CRM: Patient's spouse called in stating that she needs some advice on how to began caring for the patient being his health is depleting patient states she wasn't sure if she should bring husband in or can someone sit and talk to her and a family member because the patient is not getting any better and really needs some assistance in caring for him.

## 2023-11-20 NOTE — Telephone Encounter (Signed)
Copied from CRM [id number]. Topic: General - Other >> Nov 20, 2023 12:15 PM [name] wrote: Reason for CRM: Savannah from [employer] called on behalf of the patient regarding a referral for a transport chair. She states she needs the narrative on a progress note that states the patient has a caregiver or family member that can propel the patient in a chair. As well as a second narrative that a cane, crutch, or walker would not be appropriate due to the patient's diagnoses. It needs to state that a transport chair is what is needed for the patient as well as their specific diagnosis. She also requests it cannot be on the fax cover sheet and needs to be signed by the patient's provider. Their fax number is [phone].

## 2023-11-23 ENCOUNTER — Other Ambulatory Visit: Payer: Self-pay | Admitting: Family Medicine

## 2023-11-23 NOTE — Telephone Encounter (Unsigned)
Copied from CRM [id number]. Topic: Clinical - Medication Refill >> Nov 23, 2023  9:47 AM Adelina Mings wrote: Most Recent Primary Care Visit:  Provider: Lynnea Ferrier T  Department: [facility]  Visit Type: FOLLOW UP 15  Date: 09/01/2023  Medication: ***  Has the patient contacted their pharmacy? {yes/no:20286} (Agent: If no, request that the patient contact the pharmacy for the refill. If patient does not wish to contact the pharmacy document the reason why and proceed with request.) (Agent: If yes, when and what did the pharmacy advise?)  Is this the correct pharmacy for this prescription? {yes/no:20286} If no, delete pharmacy and type the correct one.  This is the patient's preferred pharmacy:  [id number] [place], [place] - [address] [place] [address] Phone: [phone] Fax: [phone]   Has the prescription been filled recently? {yes/no:20286}  Is the patient out of the medication? {yes/no:20286}  Has the patient been seen for an appointment in the last year OR does the patient have an upcoming appointment? {yes/no:20286}  Can we respond through MyChart? {yes/no:20286}  Agent: Please be advised that Rx refills may take up to 3 business days. We ask that you follow-up with your pharmacy.

## 2023-11-24 ENCOUNTER — Telehealth: Payer: Self-pay

## 2023-11-24 MED ORDER — DONEPEZIL HCL 10 MG PO TABS: ORAL_TABLET | ORAL | 3 refills | Status: DC

## 2023-11-24 NOTE — Telephone Encounter (Signed)
Order for transport wheel chair sent in to [facility], DME provider. Mjp,lpn

## 2023-11-24 NOTE — Telephone Encounter (Signed)
Copied from CRM [id number]. Topic: Clinical - Medication Refill >> Nov 24, 2023  8:12 AM Adelina Mings wrote: Most Recent Primary Care Visit:  Provider: Lynnea Ferrier T  Department: [facility]  Visit Type: FOLLOW UP 15  Date: 09/01/2023  Medication:donepezil (ARICEPT) 10 MG tablet    Has the patient contacted their pharmacy? No (Agent: If no, request that the patient contact the pharmacy for the refill. If patient does not wish to contact the pharmacy document the reason why and proceed with request.) (Agent: If yes, when and what did the pharmacy advise?)  Is this the correct pharmacy for this prescription? Yes If no, delete pharmacy and type the correct one.  This is the patient's preferred pharmacy:  [id number] [place], [place] - [address] [place] [address] Phone: [phone] Fax: [phone]   Has the prescription been filled recently? No  Is the patient out of the medication? Yes  Has the patient been seen for an appointment in the last year OR does the patient have an upcoming appointment? Yes  Can we respond through MyChart? No  Agent: Please be advised that Rx refills may take up to 3 business days. We ask that you follow-up with your pharmacy.

## 2023-11-30 ENCOUNTER — Encounter: Payer: Self-pay | Admitting: Family Medicine

## 2023-11-30 ENCOUNTER — Ambulatory Visit (INDEPENDENT_AMBULATORY_CARE_PROVIDER_SITE_OTHER): Payer: Medicare Other | Admitting: Family Medicine

## 2023-11-30 VITALS — BP 116/62 | HR 65 | Ht 68.0 in | Wt 145.2 lb

## 2023-11-30 DIAGNOSIS — G3185 Corticobasal degeneration: Secondary | ICD-10-CM

## 2023-11-30 NOTE — Progress Notes (Signed)
Subjective:    Patient ID: Jay Rivas, male    DOB: [birth year], 73 y.o.   MRN: [id number]   Patient has a history of corticobasal degeneration.  Unfortunately he is becoming progressively more confused.  He is now having trouble moving his right arm and his right leg.  He has bilateral tremors in his arm.  His wife is having a difficult time caring for him.  He has a hard time following commands.  She is afraid that he is going to fall stepping into or out of the shower.  She is requesting assistance in caring for him.  She already has help coming from home health nursing as well as physical therapy.  Past Medical History:  Diagnosis Date   Carpal tunnel syndrome on both sides    Dementia (HCC)    diagnosed in jan 2019, pt states medication seems to be helping, A&Ox4   HLD (hyperlipidemia)    Hypertension    Memory disorder 11/24/2017   Myocardial infarction [facility])    Dr. Sharyn Lull   Spinal stenosis of cervical region    Past Surgical History:  Procedure Laterality Date   ABDOMINAL SURGERY     CARDIAC CATHETERIZATION  2005   stent   CARPAL TUNNEL RELEASE Right    CARPAL TUNNEL RELEASE Right 03/09/2018   Procedure: RIGHT CARPAL TUNNEL RELEASE;  Surgeon: Cindee Salt, MD;  Location: [facility];  Service: Orthopedics;  Laterality: Right;   CARPAL TUNNEL RELEASE Left 11/02/2018   Procedure: LEFT CARPAL TUNNEL RELEASE;  Surgeon: Cindee Salt, MD;  Location: [facility];  Service: Orthopedics;  Laterality: Left;   TENDON TRANSFER Left 11/02/2018   Procedure: LEFT CAMITZ TRANSFER;  Surgeon: Cindee Salt, MD;  Location: [facility];  Service: Orthopedics;  Laterality: Left;   TRIGGER FINGER RELEASE Right 03/09/2018   Procedure: RELEASE TRIGGER FINGER/A-1 PULLEY RIGHT MIDDLE, RING AND SMALL FINGERS;  Surgeon: Cindee Salt, MD;  Location: [facility];  Service: Orthopedics;  Laterality: Right;   ULNAR NERVE TRANSPOSITION Left 11/02/2018    Procedure: LEFT ULNAR NERVE DECOMPRESSION;  Surgeon: Cindee Salt, MD;  Location: [facility];  Service: Orthopedics;  Laterality: Left;   ULNAR TUNNEL RELEASE Right 03/09/2018   Procedure: CUBITAL TUNNEL DECOMPRESSION RIGHT ELBOW;  Surgeon: Cindee Salt, MD;  Location: [facility];  Service: Orthopedics;  Laterality: Right;   Current Outpatient Medications on File Prior to Visit  Medication Sig Dispense Refill   aspirin EC 81 MG tablet Take 81 mg by mouth daily.     carbidopa-levodopa (SINEMET) 25-250 MG tablet Take 1 tablet by mouth 3 (three) times daily. 90 tablet 3   Coconut Oil 1000 MG CAPS Take by mouth.     Coenzyme Q10 (COQ-10) 200 MG CAPS Take by mouth.     donepezil (ARICEPT) 10 MG tablet TAKE 1 TABLET BY MOUTH EVERYDAY AT BEDTIME 90 tablet 3   hydrocortisone 2.5 % lotion Apply topically.     ketoconazole (NIZORAL) 2 % shampoo SMARTSIG:Topical 2-3 Times Weekly     losartan (COZAAR) 100 MG tablet Take 100 mg by mouth daily.     Omega-3 Fatty Acids (FISH OIL) 1000 MG CAPS Take by mouth.     rOPINIRole (REQUIP) 0.25 MG tablet Take by mouth. (Patient not taking: Reported on 11/30/2023)     No current facility-administered medications on file prior to visit.   Allergies  Allergen Reactions   Penicillin G Anaphylaxis   Social History  Socioeconomic History   Marital status: Married    Spouse name: Brayton Caves   Number of children: 2   Years of education: Not on file   Highest education level: Not on file  Occupational History   Not on file  Tobacco Use   Smoking status: Former   Smokeless tobacco: Former   Tobacco comments:    quit 1990  Substance and Sexual Activity   Alcohol use: Yes    Alcohol/week: 24.0 standard drinks of alcohol    Types: 24 Cans of beer per week    Comment: social beer only   Drug use: No   Sexual activity: Yes  Other Topics Concern   Not on file  Social History Narrative   Lives with wife, Brayton Caves.    1 son and 1 daughter.    2 grandchildren.   Right handed    Social Drivers of Health   Financial Resource Strain: Low Risk  (12/12/2021)   Overall Financial Resource Strain (CARDIA)    Difficulty of Paying Living Expenses: Not hard at all  Food Insecurity: No Food Insecurity (01/03/2022)   Hunger Vital Sign    Worried About Running Out of Food in the Last Year: Never true    Ran Out of Food in the Last Year: Never true  Transportation Needs: No Transportation Needs (01/03/2022)   PRAPARE - Administrator, Civil Service (Medical): No    Lack of Transportation (Non-Medical): No  Physical Activity: Insufficiently Active (12/12/2021)   Exercise Vital Sign    Days of Exercise per Week: 3 days    Minutes of Exercise per Session: 20 min  Stress: No Stress Concern Present (12/12/2021)   [employer] of Occupational Health - Occupational Stress Questionnaire    Feeling of Stress : Not at all  Social Connections: Socially Integrated (12/12/2021)   Social Connection and Isolation Panel [NHANES]    Frequency of Communication with Friends and Family: More than three times a week    Frequency of Social Gatherings with Friends and Family: More than three times a week    Attends Religious Services: 1 to 4 times per year    Active Member of [employer] or Organizations: Yes    Attends Banker Meetings: 1 to 4 times per year    Marital Status: Married  Catering manager Violence: Not At Risk (12/12/2021)   Humiliation, Afraid, Rape, and Kick questionnaire    Fear of Current or Ex-Partner: No    Emotionally Abused: No    Physically Abused: No    Sexually Abused: No   No family history on file.     Review of Systems  All other systems reviewed and are negative.      Objective:   Physical Exam Vitals reviewed.  Constitutional:      General: He is not in acute distress.    Appearance: He is well-developed. He is not diaphoretic.  HENT:     Head: Normocephalic and atraumatic.     Right Ear:  External ear normal.     Left Ear: External ear normal.     Nose: Nose normal.     Mouth/Throat:     Pharynx: No oropharyngeal exudate.  Eyes:     General: No scleral icterus.       Right eye: No discharge.        Left eye: No discharge.     Conjunctiva/sclera: Conjunctivae normal.     Pupils: Pupils are equal, round, and reactive to light.  Neck:     Thyroid: No thyromegaly.     Vascular: No JVD.  Cardiovascular:     Rate and Rhythm: Normal rate and regular rhythm.     Heart sounds: Normal heart sounds. No murmur heard.    No friction rub. No gallop.  Pulmonary:     Effort: Pulmonary effort is normal. No respiratory distress.     Breath sounds: Normal breath sounds. No wheezing or rales.  Chest:     Chest wall: No tenderness.  Abdominal:     General: Bowel sounds are normal. There is no distension.     Palpations: Abdomen is soft. There is no mass.     Tenderness: There is no abdominal tenderness. There is no guarding or rebound.  Musculoskeletal:     Cervical back: Normal range of motion and neck supple.  Lymphadenopathy:     Cervical: No cervical adenopathy.  Neurological:     Mental Status: He is alert. He is disoriented.     Cranial Nerves: No cranial nerve deficit.     Sensory: Sensation is intact.     Motor: Weakness and tremor present.     Coordination: Romberg sign positive. Coordination abnormal. Finger-Nose-Finger Test abnormal.     Gait: Gait abnormal.     Deep Tendon Reflexes: Reflexes are normal and symmetric.  Psychiatric:        Behavior: Behavior normal.        Thought Content: Thought content normal.        Judgment: Judgment normal.   Patient very stiff in his upper and lower extremities.  He has a cogwheel rigidity in both arms.  Right arm is much worse than left arm.  He also has a difficult time following commands.     Assessment & Plan:  Corticobasal degeneration (HCC) Spent 30 minutes a day with patient and family cussing gaps in care.  I  recommended a home health aide that the patient would have to pay out-of-pocket to help with feeding, bathing, and care around the home.  I discussed with the patient's wife how to arrange this.  If this is not sufficient, we may need to consider placement in skilled nursing facility. My heart goes out to this family.

## 2023-12-10 ENCOUNTER — Telehealth: Payer: Self-pay

## 2023-12-10 ENCOUNTER — Other Ambulatory Visit: Payer: Self-pay | Admitting: Family Medicine

## 2023-12-10 DIAGNOSIS — F03B0 Unspecified dementia, moderate, without behavioral disturbance, psychotic disturbance, mood disturbance, and anxiety: Secondary | ICD-10-CM

## 2023-12-10 DIAGNOSIS — G3185 Corticobasal degeneration: Secondary | ICD-10-CM

## 2023-12-10 NOTE — Telephone Encounter (Signed)
Copied from CRM [id number]. Topic: General - Other >> Dec 10, 2023 10:30 AM Antony Haste wrote: Reason for CRM: Drinda Butts from [facility] states the patient's wife Brayton Caves is  requesting an order for a social worker and speech therapist on behalf of this patient. Drinda Butts states he may have fallen on 02/10, unsure since he is denying any injury but he has a abrasion on the left side of his forehead with no pain.  Callback #: [phone]

## 2023-12-10 NOTE — Progress Notes (Signed)
[id number]

## 2023-12-11 ENCOUNTER — Telehealth: Payer: Self-pay | Admitting: *Deleted

## 2023-12-11 ENCOUNTER — Other Ambulatory Visit: Payer: Self-pay

## 2023-12-11 DIAGNOSIS — F03B0 Unspecified dementia, moderate, without behavioral disturbance, psychotic disturbance, mood disturbance, and anxiety: Secondary | ICD-10-CM

## 2023-12-11 DIAGNOSIS — G3185 Corticobasal degeneration: Secondary | ICD-10-CM

## 2023-12-11 NOTE — Progress Notes (Signed)
Complex Care Management Note Care Guide Note  12/11/2023 Name: Jay Rivas MRN: [id number] DOB: 11-20-1950   Complex Care Management Outreach Attempts: An unsuccessful telephone outreach was attempted today to offer the patient information about available complex care management services.  Follow Up Plan:  Additional outreach attempts will be made to offer the patient complex care management information and services.   Encounter Outcome:  No Answer  Gwenevere Ghazi  [facility]  [facility] Guide  Direct Dial: [phone]  Fax [phone]

## 2023-12-14 NOTE — Progress Notes (Signed)
Complex Care Management Note  Care Guide Note 12/14/2023 Name: Jay Rivas MRN: [id number] DOB: Dec 18, 1950  Jay Rivas is a 73 y.o. year old male who sees Pickard, Priscille Heidelberg, MD for primary care. I reached out to Gustavus Messing by phone today to offer complex care management services.  Mr. Blandon was given information about Complex Care Management services today including:   The Complex Care Management services include support from the care team which includes your Nurse Care Manager, Clinical Social Worker, or Pharmacist.  The Complex Care Management team is here to help remove barriers to the health concerns and goals most important to you. Complex Care Management services are voluntary, and the patient may decline or stop services at any time by request to their care team member.   Complex Care Management Consent Status: Patient agreed to services and verbal consent obtained.   Follow up plan:  Telephone appointment with complex care management team member scheduled for:  12/22/23  Encounter Outcome:  Patient Scheduled Jay Rivas  [facility]  [facility], [facility] Guide  Direct Dial: [phone]  Fax [phone]

## 2023-12-16 ENCOUNTER — Telehealth: Payer: Self-pay | Admitting: Family Medicine

## 2023-12-16 NOTE — Telephone Encounter (Unsigned)
Copied from CRM [id number]. Topic: Referral - Request for Referral >> Dec 16, 2023 11:15 AM Shon Hale wrote: Did the patient discuss referral with their provider in the last year? Yes  Appointment offered? No  Type of order/referral and detailed reason for visit: Speech Therapy home health   Preference of office, provider, location: [facility]  If referral order, have you been seen by this specialty before? Yes Physical Therapy  Can we respond through MyChart? No

## 2023-12-21 ENCOUNTER — Encounter: Payer: Self-pay | Admitting: *Deleted

## 2023-12-21 ENCOUNTER — Ambulatory Visit: Payer: Self-pay | Admitting: *Deleted

## 2023-12-21 NOTE — Patient Outreach (Signed)
 Care Coordination   Initial Visit Note   12/21/2023  Name: CRISTOVAL TEALL MRN: [id number] DOB: 06/14/1951  DEMARION PONDEXTER is a 73 y.o. year old male who sees Pickard, Priscille Heidelberg, MD for primary care. I spoke with patient's wife, Dwight Adamczak by phone today.  What matters to the patients health and wellness today?  Receive Assistance Obtaining In-Home Care Services.    Goals Addressed             This Visit's Progress    Receive Assistance Obtaining In-Home Care Services.   On track    Care Coordination Interventions:  Interventions Today    Flowsheet Row Most Recent Value  Chronic Disease   Chronic disease during today's visit Hypertension (HTN), Other  [Myoclonus, Memory Disorder, Hyperlipidemia, Abnormal Gait, Caregiver Burnout, Stress, & Fatigue, Requesting Assistance Applying for Medicaid & Personal Care Services.]  General Interventions   General Interventions Discussed/Reviewed General Interventions Discussed, Labs, Vaccines, Doctor Visits, Level of Care, Communication with, Health Screening, Annual Foot Exam, Lipid Profile, General Interventions Reviewed, Sick Day Rules, Community Resources, Horticulturist, commercial (DME), Annual Eye Exam  [Encouraged Routine Engagement with Care Team Members & Providers.]  Labs Hgb A1c every 3 months, Kidney Function, Hgb A1c annually  [Encouraged Routine Lab Work.]  Vaccines COVID-19, Flu, Pneumonia, RSV, Shingles, Tetanus/Pertussis/Diphtheria  [Encouraged Annual Vaccinations.]  Doctor Visits Discussed/Reviewed Doctor Visits Discussed, Specialist, Doctor Visits Reviewed, Annual Wellness Visits, PCP  [Encouraged Routine Engagement with Care Team Members & Providers.]  Health Screening Bone Density, Colonoscopy, Prostate  [Encouraged Routine Health Screenings.]  Durable Medical Equipment (DME) BP Cuff, Other  [Cane, Scales, Hand-Held Shower Hose, Engineer, materials in [employer], Prescription Eyeglasses.]  PCP/Specialist Visits Compliance with follow-up  visit  [Encouraged Routine Engagement with Care Team Members & Providers.]  Communication with PCP/Specialists, RN, Pharmacists, Social Work  [employer] Routine Engagement with Care Team Members & Providers.]  Level of Care Adult Daycare, Personal Care Services, Applications, Assisted Living, Skilled Nursing Facility  [Confirmed Disinterest in Enrollment in Adult Day Care Program. Confirmed Disinterest in Pursuing Higher Level of Care Placement Options.]  Applications Medicaid, Personal Care Services  [place] Interest in Applying for Medicaid & Personal Care Services, Teaching laboratory technician & Offering to Assist with Completion & Submission.]  Exercise Interventions   Exercise Discussed/Reviewed Exercise Discussed, Assistive device use and maintanence, Exercise Reviewed, Physical Activity, Weight Managment  [Encouraged Increased Level of Activity & Exercise, Inside & Outside the Home.]  Physical Activity Discussed/Reviewed Physical Activity Discussed, Home Exercise Program (HEP), PREP, Physical Activity Reviewed, Gym, Types of exercise  [Encouraged Daily Exercise Regimen, as Tolerated.]  Weight Management Weight maintenance  [Encouraged Healthy Diet.]  Education Interventions   Education Provided Provided Therapist, sports, Provided Web-based Education, Provided Education  [employer] Reviewed Educational Material to [employer] & Entertain Questions.]  Provided Verbal Education On Nutrition, Mental Health/Coping with Illness, When to see the doctor, Foot Care, Eye Care, Applications, Labs, Exercise, Blood Sugar Monitoring, Medication, Development worker, community, [employer] Resources  [Encouraged Continued Independent Review of Educational Material Provided.]  Labs Reviewed Hgb A1c  [Reviewed & Encouraged Routine Monitoring.]  Ship broker, Personal Care Services  [employer] Interest in Applying for [employer] & Personal Care Services, Teaching laboratory technician & Offering to Assist with Completion &  Submission.]  Mental Health Interventions   Mental Health Discussed/Reviewed Mental Health Discussed, Anxiety, Depression, Grief and Loss, Mental Health Reviewed, Substance Abuse, Coping Strategies, Suicide, Crisis, Other  [Assessed Mental Health & Cognitive Status.]  Nutrition Interventions   Nutrition Discussed/Reviewed Nutrition  Discussed, Adding fruits and vegetables, Increasing proteins, Decreasing fats, Nutrition Reviewed, Fluid intake, Decreasing salt, Portion sizes, Carbohydrate meal planning, Decreasing sugar intake, Supplemental nutrition  [Encouraged Heart-Healthy, Low Sodium, High Fiber, Protein-Rich Diet.]  Pharmacy Interventions   Pharmacy Dicussed/Reviewed Pharmacy Topics Discussed, Medications and their functions, Medication Adherence, Pharmacy Topics Reviewed, Affording Medications  [Confirmed Ability to Afford Prescription Medications.]  Medication Adherence --  [Confirmed Compliance with Prescription Medications.]  Safety Interventions   Safety Discussed/Reviewed Safety Discussed, Safety Reviewed, Fall Risk, Home Safety  [Encouraged Routine Use of Assistive Devices & Durable Medical Equipment.]  Home Safety Assistive Devices, Need for home safety assessment  [Encouraged Consideration of Home Safety Evaluation.]  Advanced Directive Interventions   Advanced Directives Discussed/Reviewed Advanced Directives Discussed, Advanced Directives Reviewed  [Confirmed Initiation of Advanced Directives (Living Will & Healthcare Power of Attorney Documents), Requesting Copies to Scan into Electronic Medical Record in Epic.]      Assessed Social Determinant of Health Barriers. Discussed Plans for Ongoing Care Management Follow Up. Provided Careers information officer Information for Care Management Team Members. Screened for Signs & Symptoms of Depression, Related to Chronic Disease State.  PHQ2 & PHQ9 Depression Screen Completed & Results Reviewed.  Suicidal Ideation & Homicidal Ideation Assessed - None  Present.   Domestic Violence Assessed - None Present. Access to Weapons Assessed - None Present.   Active Listening & Reflection Utilized.  Verbalization of Feelings Encouraged.  Emotional Support Provided. Feelings of Caregiver Burnout & Fatigue Validated. Caregiver Stress Acknowledged. Caregiver Resources Reviewed. Caregiver Support Groups Mailed. Self-Enrollment in Caregiver Support Group of Interest Emphasized. Crisis Support Information, Agencies, Services, & Resources Discussed. Problem Solving Interventions Identified. Task-Centered Solutions Implemented.   Solution-Focused Strategies Developed. Acceptance & Commitment Therapy Introduced. Brief Cognitive Behavioral Therapy Initiated. Client-Centered Therapy Enacted. Reviewed Prescription Medications & Discussed Importance of Compliance. Quality of Sleep Assessed & Sleep Hygiene Techniques Promoted. CSW Collaboration with Wife, Jupiter Kabir to [employer] No In-Home Care Services, Geologist, engineering, Warden/ranger, Catering manager., Covered Under Firefighter through [employer] & [employer].   CSW Collaboration with Wife, Dijuan Sleeth to Verify No Long-Term [employer], [employer], Plans, Coverage, Etc.  CSW Collaboration with Wife, Aaiden Depoy to Confirm Neither She Nor Patient Were Veterans, Making Them Ineligible to Apply for Aid & Attendance Benefits, Through [employer]. CSW Collaboration with Wife, Tarek Cravens to Request Review of The Following List of [employer], [employer], Resources, Occupational psychologist, Mailed & Emailed on 12/21/2023: ~ Adult Day Care Programs  ~ 2024 Medicaid Tips ~ Instructions for How to Apply for Medicaid Online ~ Medicaid Application ~ In-Home Care & Respite Agencies ~ Home Health Care Agencies ~ Respite Care Agencies & Facilities ~ Personal Care Services Application  ~ Teacher, music Providers CSW Collaboration with Wife, Jamey Harman to Control and instrumentation engineer with [employer], Services, & Resources of Interest, in An Effort to Obtain 24 Hour Care & Supervision in The Home. CSW Collaboration with Wife, Brondon Wann to Encourage Completion of Application for Medicaid & Submission to [facility] of Social Services [phone]) for Processing. CSW Collaboration with Wife, Cristen Murcia to Encourage Completion of Application for Personal Care Services & Submission to [employer] [facility] [phone]) for Processing, Once Approved for Medicaid, through The [facility] of Social Services [phone]). CSW Collaboration with Wife, La Dibella to Encourage Routine Engagement with Danford Bad, Licensed Clinical Social Worker with [facility], [employer] [phone]), if She Has Questions, Needs Assistance, or If Additional Social Work Needs Are Identified Between Now & Our Next Follow-Up Outreach Call, Scheduled on 12/28/2023 at 1:45 PM.        SDOH assessments and interventions completed:  Yes.  SDOH Interventions Today    Flowsheet Row Most Recent Value  SDOH Interventions   Food Insecurity Interventions Intervention Not Indicated  Housing Interventions Intervention Not Indicated  Transportation Interventions Intervention Not Indicated, Patient Resources (Friends/Family), Community Resources Provided  Utilities Interventions Intervention Not Indicated  Alcohol Usage Interventions Intervention Not Indicated (Score <7)  Financial Strain Interventions Intervention Not Indicated  Physical Activity Interventions Intervention Not Indicated, Community Resources Provided  Stress Interventions Intervention Not Indicated  Social Connections Interventions Intervention Not Indicated  Health Literacy Interventions Intervention Not Indicated     Care Coordination  Interventions:  Yes, provided.   Follow up plan: Follow up call scheduled for 12/28/2023 at 1:45 pm.  Encounter Outcome:  Patient Visit Completed.    Danford Bad, BSW, MSW, LCSW [facility], [facility] Clinical Social Worker II Direct Dial: [phone]  Fax: [phone] Website: [email or web address]

## 2023-12-21 NOTE — Patient Instructions (Signed)
 Visit Information  Thank you for taking time to visit with me today. Please don't hesitate to contact me if I can be of assistance to you.   Following are the goals we discussed today:   Goals Addressed             This Visit's Progress    Receive Assistance Obtaining In-Home Care Services.   On track    Care Coordination Interventions:  Interventions Today    Flowsheet Row Most Recent Value  Chronic Disease   Chronic disease during today's visit Hypertension (HTN), Other  [Myoclonus, Memory Disorder, Hyperlipidemia, Abnormal Gait, Caregiver Burnout, Stress, & Fatigue, Requesting Assistance Applying for Medicaid & Personal Care Services.]  General Interventions   General Interventions Discussed/Reviewed General Interventions Discussed, Labs, Vaccines, Doctor Visits, Level of Care, Communication with, Health Screening, Annual Foot Exam, Lipid Profile, General Interventions Reviewed, Sick Day Rules, Community Resources, Horticulturist, commercial (DME), Annual Eye Exam  [Encouraged Routine Engagement with Care Team Members & Providers.]  Labs Hgb A1c every 3 months, Kidney Function, Hgb A1c annually  [Encouraged Routine Lab Work.]  Vaccines COVID-19, Flu, Pneumonia, RSV, Shingles, Tetanus/Pertussis/Diphtheria  [Encouraged Annual Vaccinations.]  Doctor Visits Discussed/Reviewed Doctor Visits Discussed, Specialist, Doctor Visits Reviewed, Annual Wellness Visits, PCP  [Encouraged Routine Engagement with Care Team Members & Providers.]  Health Screening Bone Density, Colonoscopy, Prostate  [Encouraged Routine Health Screenings.]  Durable Medical Equipment (DME) BP Cuff, Other  [Cane, Scales, Hand-Held Shower Hose, Engineer, materials in [employer], Prescription Eyeglasses.]  PCP/Specialist Visits Compliance with follow-up visit  [Encouraged Routine Engagement with Care Team Members & Providers.]  Communication with PCP/Specialists, RN, Pharmacists, Social Work  [employer] Routine Engagement with Care Team  Members & Providers.]  Level of Care Adult Daycare, Personal Care Services, Air traffic controller, Assisted Living, Skilled Nursing Facility  [Confirmed Disinterest in Enrollment in Adult Day Care Program. Confirmed Disinterest in Pursuing Higher Level of Care Placement Options.]  Applications Medicaid, Personal Care Services  [place] Interest in Applying for Medicaid & Personal Care Services, Teaching laboratory technician & Offering to Assist with Completion & Submission.]  Exercise Interventions   Exercise Discussed/Reviewed Exercise Discussed, Assistive device use and maintanence, Exercise Reviewed, Physical Activity, Weight Managment  [Encouraged Increased Level of Activity & Exercise, Inside & Outside the Home.]  Physical Activity Discussed/Reviewed Physical Activity Discussed, Home Exercise Program (HEP), PREP, Physical Activity Reviewed, Gym, Types of exercise  [Encouraged Daily Exercise Regimen, as Tolerated.]  Weight Management Weight maintenance  [Encouraged Healthy Diet.]  Education Interventions   Education Provided Provided Therapist, sports, Provided Web-based Education, Provided Education  [employer] Reviewed Educational Material to [employer] & Entertain Questions.]  Provided Verbal Education On Nutrition, Mental Health/Coping with Illness, When to see the doctor, Foot Care, Eye Care, Applications, Labs, Exercise, Blood Sugar Monitoring, Medication, Development worker, community, [employer] Resources  [Encouraged Continued Independent Review of Educational Material Provided.]  Labs Reviewed Hgb A1c  [Reviewed & Encouraged Routine Monitoring.]  Ship broker, Personal Care Services  [employer] Interest in Applying for [employer] & Personal Care Services, Teaching laboratory technician & Offering to Assist with Completion & Submission.]  Mental Health Interventions   Mental Health Discussed/Reviewed Mental Health Discussed, Anxiety, Depression, Grief and Loss, Mental Health Reviewed, Substance Abuse, Coping  Strategies, Suicide, Crisis, Other  [Assessed Mental Health & Cognitive Status.]  Nutrition Interventions   Nutrition Discussed/Reviewed Nutrition Discussed, Adding fruits and vegetables, Increasing proteins, Decreasing fats, Nutrition Reviewed, Fluid intake, Decreasing salt, Portion sizes, Carbohydrate meal planning, Decreasing sugar intake, Supplemental nutrition  [Encouraged Heart-Healthy, Low Sodium, High  Fiber, Protein-Rich Diet.]  Pharmacy Interventions   Pharmacy Dicussed/Reviewed Pharmacy Topics Discussed, Medications and their functions, Medication Adherence, Pharmacy Topics Reviewed, Affording Medications  [Confirmed Ability to Afford Prescription Medications.]  Medication Adherence --  [Confirmed Compliance with Prescription Medications.]  Safety Interventions   Safety Discussed/Reviewed Safety Discussed, Safety Reviewed, Fall Risk, Home Safety  [Encouraged Routine Use of Assistive Devices & Durable Medical Equipment.]  Home Safety Assistive Devices, Need for home safety assessment  [Encouraged Consideration of Home Safety Evaluation.]  Advanced Directive Interventions   Advanced Directives Discussed/Reviewed Advanced Directives Discussed, Advanced Directives Reviewed  [Confirmed Initiation of Advanced Directives (Living Will & Healthcare Power of Attorney Documents), Requesting Copies to Scan into Electronic Medical Record in Epic.]      Assessed Social Determinant of Health Barriers. Discussed Plans for Ongoing Care Management Follow Up. Provided Careers information officer Information for Care Management Team Members. Screened for Signs & Symptoms of Depression, Related to Chronic Disease State.  PHQ2 & PHQ9 Depression Screen Completed & Results Reviewed.  Suicidal Ideation & Homicidal Ideation Assessed - None Present.   Domestic Violence Assessed - None Present. Access to Weapons Assessed - None Present.   Active Listening & Reflection Utilized.  Verbalization of Feelings Encouraged.   Emotional Support Provided. Feelings of Caregiver Burnout & Fatigue Validated. Caregiver Stress Acknowledged. Caregiver Resources Reviewed. Caregiver Support Groups Mailed. Self-Enrollment in Caregiver Support Group of Interest Emphasized. Crisis Support Information, Agencies, Services, & Resources Discussed. Problem Solving Interventions Identified. Task-Centered Solutions Implemented.   Solution-Focused Strategies Developed. Acceptance & Commitment Therapy Introduced. Brief Cognitive Behavioral Therapy Initiated. Client-Centered Therapy Enacted. Reviewed Prescription Medications & Discussed Importance of Compliance. Quality of Sleep Assessed & Sleep Hygiene Techniques Promoted. CSW Collaboration with Wife, Dreden Rivere to [employer] No In-Home Care Services, Geologist, engineering, Warden/ranger, Catering manager., Covered Under Firefighter through [employer]   CSW Collaboration with Wife, Sirr Kabel to Verify No Long-Term [employer], [employer], Plans, Coverage, Etc.  CSW Collaboration with Wife, Parminder Cupples to Confirm Neither She Nor Patient Were Veterans, Making Them Ineligible to Apply for Aid & Attendance Benefits, Through [employer]. CSW Collaboration with Wife, Ozro Russett to Request Review of The Following List of [employer], [employer], Resources, Occupational psychologist, Mailed & Emailed on 12/21/2023: ~ Adult Day Care Programs  ~ 2024 Medicaid Tips ~ Instructions for How to Apply for Medicaid Online ~ Medicaid Application ~ In-Home Care & Respite Agencies ~ Home Health Care Agencies ~ Respite Care Agencies & Facilities ~ Personal Care Services Application  ~ Theatre manager Providers CSW Collaboration with Wife, Karlis Cregg to Control and instrumentation engineer with [employer], Services, & Resources of Interest, in An Effort to Obtain 24 Hour  Care & Supervision in The Home. CSW Collaboration with Wife, Amaree Leeper to Encourage Completion of Application for Medicaid & Submission to [facility] of Social Services [phone]) for Processing. CSW Collaboration with Wife, Jerrid Forgette to Encourage Completion of Application for Personal Care Services & Submission to [employer] [facility] [phone]) for Processing, Once Approved for Medicaid, through [facility] of Social Services [phone]). CSW Collaboration with Wife, Akin Yi to Encourage Routine Engagement with Danford Bad, Licensed Clinical Social Worker with [facility], [facility] [phone]), if She Has Questions, Needs Assistance, or If Additional Social Work Needs Are Identified Between Now & Our Next Follow-Up Outreach Call, Scheduled on 12/28/2023 at  1:45 PM.      Our next appointment is by telephone on 12/28/2023 at 1:45 pm.  Please call the care guide team at [phone] if you need to cancel or reschedule your appointment.   If you are experiencing a Mental Health or Behavioral Health Crisis or need someone to talk to, please call the Suicide and Crisis Lifeline: 988 call the [place] National Suicide Prevention Lifeline: 989-470-2557 or TTY: 952-209-1447 TTY (857) 825-1712) to talk to a trained counselor call 1-800-273-TALK (toll free, 24 hour hotline) go to [facility] [address] [place] [phone]) call the [facility] Crisis Line: [phone] call 911  Patient verbalizes understanding of instructions and care plan provided today and agrees to view in MyChart. Active MyChart status and patient understanding of how to access instructions and care plan via MyChart confirmed with patient.     Telephone follow up appointment with care management team member scheduled for:   12/28/2023 at 1:45  pm.   Danford Bad, BSW, MSW, LCSW   [facility], [facility] Clinical Social Worker II Direct Dial: [phone]  Fax: [phone] Website: [email or web address]

## 2023-12-22 ENCOUNTER — Encounter: Payer: Medicare Other | Admitting: *Deleted

## 2023-12-22 ENCOUNTER — Telehealth: Payer: Self-pay

## 2023-12-22 ENCOUNTER — Encounter: Payer: Self-pay | Admitting: *Deleted

## 2023-12-22 NOTE — Telephone Encounter (Signed)
 Copied from CRM [id number]. Topic: General - Other >> Dec 22, 2023  9:56 AM Fuller Mandril wrote: Reason for CRM: Drinda Butts called from [facility] about order for transport chair. States they have been requesting since November. She spoke with [employer] and they have order but was not able to process order. States they need a recent progress not that states: Cane, crutch, walker not sufficient due to medical condition and needs to say patient has family member or caregiver present willing and cappable to propel patient in transport chair. Thank You

## 2023-12-25 ENCOUNTER — Ambulatory Visit: Payer: Self-pay | Admitting: Family Medicine

## 2023-12-25 NOTE — Telephone Encounter (Signed)
  Chief Complaint: Diarrhea Symptoms: loose watery diarrhea Frequency: couple of days Pertinent Negatives: Patient denies fever, abdominal pain Disposition: [] ED /[] Urgent Care (no appt availability in office) / [] Appointment(In office/virtual)/ []  [facility]/ [x] Home Care/ [] Refused Recommended Disposition /[] [facility]/ []  Follow-up with PCP Additional Notes: patient's wife called with concerns of diarrhea-1-3 stools over normal. Loose watery diarrhea that has been going on for couple of days. Wife states that patient hasn't tried any over the counter medication. Per protocol, home care is recommended at this time. Home care information given to wife who verbalizes understanding of plan and all questions answered, wife is instructed to call back if OTC medications aren't helping patient.     Copied From CRM [id number]. Reason for Triage: Uncontrolled Diarrhea, his wife is not sure what to do.  Callback #:[phone]  Reason for Disposition  MILD-MODERATE diarrhea (e.g., 1-6 times / day more than normal)  Answer Assessment - Initial Assessment Questions 1. DIARRHEA SEVERITY: "How bad is the diarrhea?" "How many more stools have you had in the past 24 hours than normal?"    - NO DIARRHEA (SCALE 0)   - MILD (SCALE 1-3): Few loose or mushy BMs; increase of 1-3 stools over normal daily number of stools; mild increase in ostomy output.   -  MODERATE (SCALE 4-7): Increase of 4-6 stools daily over normal; moderate increase in ostomy output.   -  SEVERE (SCALE 8-10; OR "WORST POSSIBLE"): Increase of 7 or more stools daily over normal; moderate increase in ostomy output; incontinence.     Moderate 2. ONSET: "When did the diarrhea begin?"      Started a couple of days ago 3. BM CONSISTENCY: "How loose or watery is the diarrhea?"      Loose and watery 4. VOMITING: "Are you also vomiting?" If Yes, ask: "How many times in the past 24 hours?"      No 5. ABDOMEN PAIN: "Are you  having any abdomen pain?" If Yes, ask: "What does it feel like?" (e.g., crampy, dull, intermittent, constant)      no 6. ABDOMEN PAIN SEVERITY: If present, ask: "How bad is the pain?"  (e.g., Scale 1-10; mild, moderate, or severe)   - MILD (1-3): doesn't interfere with normal activities, abdomen soft and not tender to touch    - MODERATE (4-7): interferes with normal activities or awakens from sleep, abdomen tender to touch    - SEVERE (8-10): excruciating pain, doubled over, unable to do any normal activities       N/A 7. ORAL INTAKE: If vomiting, "Have you been able to drink liquids?" "How much liquids have you had in the past 24 hours?"     Yes 8. HYDRATION: "Any signs of dehydration?" (e.g., dry mouth [not just dry lips], too weak to stand, dizziness, new weight loss) "When did you last urinate?"     No 9. EXPOSURE: "Have you traveled to a foreign country recently?" "Have you been exposed to anyone with diarrhea?" "Could you have eaten any food that was spoiled?"     No 10. ANTIBIOTIC USE: "Are you taking antibiotics now or have you taken antibiotics in the past 2 months?"       No 11. OTHER SYMPTOMS: "Do you have any other symptoms?" (e.g., fever, blood in stool)       No  Protocols used: Diarrhea-A-AH

## 2023-12-28 ENCOUNTER — Ambulatory Visit: Payer: Self-pay | Admitting: *Deleted

## 2023-12-28 NOTE — Patient Instructions (Signed)
 Visit Information  Thank you for taking time to visit with me today. Please don't hesitate to contact me if I can be of assistance to you.   Following are the goals we discussed today:   Goals Addressed             This Visit's Progress    Receive Assistance Obtaining In-Home Care Services.   On track    Care Coordination Interventions:  Interventions Today    Flowsheet Row Most Recent Value  Chronic Disease   Chronic disease during today's visit Hypertension (HTN), Other  [Myoclonus, Memory Disorder, Hyperlipidemia, Abnormal Gait, Caregiver Burnout, Stress, & Fatigue, Requesting Assistance Applying for Medicaid & Personal Care Services.]  General Interventions   General Interventions Discussed/Reviewed General Interventions Discussed, Labs, Vaccines, Doctor Visits, Level of Care, Communication with, Health Screening, Annual Foot Exam, Lipid Profile, General Interventions Reviewed, Sick Day Rules, Community Resources, Horticulturist, commercial (DME), Annual Eye Exam  [Encouraged Routine Engagement with Care Team Members & Providers.]  Labs Hgb A1c every 3 months, Kidney Function, Hgb A1c annually  [Encouraged Routine Lab Work.]  Vaccines COVID-19, Flu, Pneumonia, RSV, Shingles, Tetanus/Pertussis/Diphtheria  [Encouraged Annual Vaccinations.]  Doctor Visits Discussed/Reviewed Doctor Visits Discussed, Specialist, Doctor Visits Reviewed, Annual Wellness Visits, PCP  [Encouraged Routine Engagement with Care Team Members & Providers.]  Health Screening Bone Density, Colonoscopy, Prostate  [Encouraged Routine Health Screenings.]  Durable Medical Equipment (DME) BP Cuff, Other  [Cane, Scales, Hand-Held Shower Hose, Engineer, materials in [employer], Prescription Eyeglasses.]  PCP/Specialist Visits Compliance with follow-up visit  [Encouraged Routine Engagement with Care Team Members & Providers.]  Communication with PCP/Specialists, RN, Pharmacists, Social Work  [employer] Routine Engagement with Care Team  Members & Providers.]  Level of Care Adult Daycare, Personal Care Services, Air traffic controller, Assisted Living, Skilled Nursing Facility  [Confirmed Disinterest in Enrollment in Adult Day Care Program. Confirmed Disinterest in Pursuing Higher Level of Care Placement Options.]  Applications Medicaid, Personal Care Services  [place] Interest in Applying for Medicaid & Personal Care Services, Teaching laboratory technician & Offering to Assist with Completion & Submission.]  Exercise Interventions   Exercise Discussed/Reviewed Exercise Discussed, Assistive device use and maintanence, Exercise Reviewed, Physical Activity, Weight Managment  [Encouraged Increased Level of Activity & Exercise, Inside & Outside the Home.]  Physical Activity Discussed/Reviewed Physical Activity Discussed, Home Exercise Program (HEP), PREP, Physical Activity Reviewed, Gym, Types of exercise  [Encouraged Daily Exercise Regimen, as Tolerated.]  Weight Management Weight maintenance  [Encouraged Healthy Diet.]  Education Interventions   Education Provided Provided Therapist, sports, Provided Web-based Education, Provided Education  [employer] Reviewed Educational Material to [employer] & Entertain Questions.]  Provided Verbal Education On Nutrition, Mental Health/Coping with Illness, When to see the doctor, Foot Care, Eye Care, Applications, Labs, Exercise, Blood Sugar Monitoring, Medication, Development worker, community, [employer] Resources  [Encouraged Continued Independent Review of Educational Material Provided.]  Labs Reviewed Hgb A1c  [Reviewed & Encouraged Routine Monitoring.]  Ship broker, Personal Care Services  [employer] Interest in Applying for [employer] & Personal Care Services, Teaching laboratory technician & Offering to Assist with Completion & Submission.]  Mental Health Interventions   Mental Health Discussed/Reviewed Mental Health Discussed, Anxiety, Depression, Grief and Loss, Mental Health Reviewed, Substance Abuse, Coping  Strategies, Suicide, Crisis, Other  [Assessed Mental Health & Cognitive Status.]  Nutrition Interventions   Nutrition Discussed/Reviewed Nutrition Discussed, Adding fruits and vegetables, Increasing proteins, Decreasing fats, Nutrition Reviewed, Fluid intake, Decreasing salt, Portion sizes, Carbohydrate meal planning, Decreasing sugar intake, Supplemental nutrition  [Encouraged Heart-Healthy, Low Sodium, High  Fiber, Protein-Rich Diet.]  Pharmacy Interventions   Pharmacy Dicussed/Reviewed Pharmacy Topics Discussed, Medications and their functions, Medication Adherence, Pharmacy Topics Reviewed, Affording Medications  [Confirmed Ability to Afford Prescription Medications.]  Medication Adherence --  [Confirmed Compliance with Prescription Medications.]  Safety Interventions   Safety Discussed/Reviewed Safety Discussed, Safety Reviewed, Fall Risk, Home Safety  [Encouraged Routine Use of Assistive Devices & Durable Medical Equipment.]  Home Safety Assistive Devices, Need for home safety assessment  [Encouraged Consideration of Home Safety Evaluation.]  Advanced Directive Interventions   Advanced Directives Discussed/Reviewed Advanced Directives Discussed, Advanced Directives Reviewed  [Confirmed Initiation of Advanced Directives (Living Will & Healthcare Power of Attorney Documents), Requesting Copies to Scan into Electronic Medical Record in Epic.]      Active Listening & Reflection Utilized.  Verbalization of Feelings Encouraged.  Emotional Support Provided. Self-Enrollment in Caregiver Support Group of Interest Emphasized, from List Provided. Problem Solving Interventions Employed. Task-Centered Solutions Activated.   Solution-Focused Strategies Indicated. Acceptance & Commitment Therapy Initiated. Cognitive Behavioral Therapy Performed. Client-Centered Therapy Implemented. CSW Collaboration with Wife, Caster Fayette to [employer] Review the Following List of [employer], [employer],  Resources, Occupational psychologist, to [employer], Entertain Questions, Assist with Application Completion & Submission, & Assist with Referral Process: ~ Adult Day Care Programs  ~ 2024 Medicaid Tips ~ Instructions for How to Apply for Medicaid Online ~ Medicaid Application ~ In-Home Care & Respite Agencies ~ Home Health Care Agencies ~ Respite Care Agencies & Facilities ~ Personal Care Services Application  ~ Personal Care Services Agency Providers CSW Collaboration with Wife, Kenna Kirn to Encourage Routine Engagement with Danford Bad, Licensed Clinical Social Worker with [facility], [facility] [phone]), if She Has Questions, Needs Assistance, or If Additional Social Work Needs Are Identified Between Now & Our Next Follow-Up Outreach Call, Scheduled on 12/31/2023 at 3:00 PM.      Our next appointment is by telephone on 12/31/2023 at 3:00 pm.  Please call the care guide team at [phone] if you need to cancel or reschedule your appointment.   If you are experiencing a Mental Health or Behavioral Health Crisis or need someone to talk to, please call the Suicide and Crisis Lifeline: 988 call the [place] National Suicide Prevention Lifeline: (551)632-8428 or TTY: 804-879-3346 TTY 409-827-8690) to talk to a trained counselor call 1-800-273-TALK (toll free, 24 hour hotline) go to [facility] [address] [place] [phone]) call the [facility] Crisis Line: [phone] call 911  Patient verbalizes understanding of instructions and care plan provided today and agrees to view in MyChart. Active MyChart status and patient understanding of how to access instructions and care plan via MyChart confirmed with patient.     Telephone follow up appointment with care management team member scheduled for: 12/31/2023 at 3:00 pm.   Danford Bad, BSW, MSW, LCSW [place]  [facility], [facility] Clinical Social Worker II Direct Dial: [phone]  Fax: [phone] Website: [email or web address]

## 2023-12-28 NOTE — Telephone Encounter (Signed)
 FYI:  Pls see msg

## 2023-12-28 NOTE — Patient Outreach (Signed)
 Care Coordination   Follow Up Visit Note   12/28/2023  Name: Jay Rivas MRN: [id number] DOB: 11-02-1950  Jay Rivas is a 73 y.o. year old male who sees Pickard, Priscille Heidelberg, MD for primary care. I spoke with patient's wife, Jay Rivas by phone today.  What matters to the patients health and wellness today?  Receive Assistance Obtaining In-Home Care Services.    Goals Addressed             This Visit's Progress    Receive Assistance Obtaining In-Home Care Services.   On track    Care Coordination Interventions:  Interventions Today    Flowsheet Row Most Recent Value  Chronic Disease   Chronic disease during today's visit Hypertension (HTN), Other  [Myoclonus, Memory Disorder, Hyperlipidemia, Abnormal Gait, Caregiver Burnout, Stress, & Fatigue, Requesting Assistance Applying for Medicaid & Personal Care Services.]  General Interventions   General Interventions Discussed/Reviewed General Interventions Discussed, Labs, Vaccines, Doctor Visits, Level of Care, Communication with, Health Screening, Annual Foot Exam, Lipid Profile, General Interventions Reviewed, Sick Day Rules, Community Resources, Horticulturist, commercial (DME), Annual Eye Exam  [Encouraged Routine Engagement with Care Team Members & Providers.]  Labs Hgb A1c every 3 months, Kidney Function, Hgb A1c annually  [Encouraged Routine Lab Work.]  Vaccines COVID-19, Flu, Pneumonia, RSV, Shingles, Tetanus/Pertussis/Diphtheria  [Encouraged Annual Vaccinations.]  Doctor Visits Discussed/Reviewed Doctor Visits Discussed, Specialist, Doctor Visits Reviewed, Annual Wellness Visits, PCP  [Encouraged Routine Engagement with Care Team Members & Providers.]  Health Screening Bone Density, Colonoscopy, Prostate  [Encouraged Routine Health Screenings.]  Durable Medical Equipment (DME) BP Cuff, Other  [Cane, Scales, Hand-Held Shower Hose, Engineer, materials in [employer], Prescription Eyeglasses.]  PCP/Specialist Visits Compliance with follow-up  visit  [Encouraged Routine Engagement with Care Team Members & Providers.]  Communication with PCP/Specialists, RN, Pharmacists, Social Work  [employer] Routine Engagement with Care Team Members & Providers.]  Level of Care Adult Daycare, Personal Care Services, Applications, Assisted Living, Skilled Nursing Facility  [Confirmed Disinterest in Enrollment in Adult Day Care Program. Confirmed Disinterest in Pursuing Higher Level of Care Placement Options.]  Applications Medicaid, Personal Care Services  [place] Interest in Applying for Medicaid & Personal Care Services, Teaching laboratory technician & Offering to Assist with Completion & Submission.]  Exercise Interventions   Exercise Discussed/Reviewed Exercise Discussed, Assistive device use and maintanence, Exercise Reviewed, Physical Activity, Weight Managment  [Encouraged Increased Level of Activity & Exercise, Inside & Outside the Home.]  Physical Activity Discussed/Reviewed Physical Activity Discussed, Home Exercise Program (HEP), PREP, Physical Activity Reviewed, Gym, Types of exercise  [Encouraged Daily Exercise Regimen, as Tolerated.]  Weight Management Weight maintenance  [Encouraged Healthy Diet.]  Education Interventions   Education Provided Provided Therapist, sports, Provided Web-based Education, Provided Education  [employer] Reviewed Educational Material to [employer] & Entertain Questions.]  Provided Verbal Education On Nutrition, Mental Health/Coping with Illness, When to see the doctor, Foot Care, Eye Care, Applications, Labs, Exercise, Blood Sugar Monitoring, Medication, Development worker, community, [employer]  [Encouraged Continued Independent Review of Educational Material Provided.]  Labs Reviewed Hgb A1c  [Reviewed & Encouraged Routine Monitoring.]  Ship broker, Personal Care Services  [employer] Interest in Applying for [employer] & Personal Care Services, Teaching laboratory technician & Offering to Assist with Completion &  Submission.]  Mental Health Interventions   Mental Health Discussed/Reviewed Mental Health Discussed, Anxiety, Depression, Grief and Loss, Mental Health Reviewed, Substance Abuse, Coping Strategies, Suicide, Crisis, Other  [Assessed Mental Health & Cognitive Status.]  Nutrition Interventions   Nutrition Discussed/Reviewed  Nutrition Discussed, Adding fruits and vegetables, Increasing proteins, Decreasing fats, Nutrition Reviewed, Fluid intake, Decreasing salt, Portion sizes, Carbohydrate meal planning, Decreasing sugar intake, Supplemental nutrition  [Encouraged Heart-Healthy, Low Sodium, High Fiber, Protein-Rich Diet.]  Pharmacy Interventions   Pharmacy Dicussed/Reviewed Pharmacy Topics Discussed, Medications and their functions, Medication Adherence, Pharmacy Topics Reviewed, Affording Medications  [Confirmed Ability to Afford Prescription Medications.]  Medication Adherence --  [Confirmed Compliance with Prescription Medications.]  Safety Interventions   Safety Discussed/Reviewed Safety Discussed, Safety Reviewed, Fall Risk, Home Safety  [Encouraged Routine Use of Assistive Devices & Durable Medical Equipment.]  Home Safety Assistive Devices, Need for home safety assessment  [Encouraged Consideration of Home Safety Evaluation.]  Advanced Directive Interventions   Advanced Directives Discussed/Reviewed Advanced Directives Discussed, Advanced Directives Reviewed  [Confirmed Initiation of Advanced Directives (Living Will & Healthcare Power of Attorney Documents), Requesting Copies to Scan into Electronic Medical Record in Epic.]      Active Listening & Reflection Utilized.  Verbalization of Feelings Encouraged.  Emotional Support Provided. Self-Enrollment in Caregiver Support Group of Interest Emphasized, from List Provided. Problem Solving Interventions Employed. Task-Centered Solutions Activated.   Solution-Focused Strategies Indicated. Acceptance & Commitment Therapy Initiated. Cognitive  Behavioral Therapy Performed. Client-Centered Therapy Implemented. CSW Collaboration with Wife, Jay Rivas to [employer] Review the Following List of [employer], [employer], Resources, Occupational psychologist, to [employer], Entertain Questions, Assist with Application Completion & Submission, & Assist with Referral Process: ~ Adult Day Care Programs  ~ 2024 Medicaid Tips ~ Instructions for How to Apply for Medicaid Online ~ Medicaid Application ~ In-Home Care & Respite Agencies ~ Home Health Care Agencies ~ Respite Care Agencies & Facilities ~ Personal Care Services Application  ~ Personal Care Services Agency Providers CSW Collaboration with Wife, Jay Rivas to Encourage Routine Engagement with Danford Bad, Licensed Clinical Social Worker with [facility], [facility] [phone]), if She Has Questions, Needs Assistance, or If Additional Social Work Needs Are Identified Between Now & Our Next Follow-Up Outreach Call, Scheduled on 12/31/2023 at 3:00 PM.      SDOH assessments and interventions completed:  Yes.  Care Coordination Interventions:  Yes, provided.   Follow up plan: Follow up call scheduled for 12/31/2023 at 3:00 pm.  Encounter Outcome:  Patient Visit Completed.   Danford Bad, BSW, MSW, LCSW [facility], [facility] Clinical Social Worker II Direct Dial: [phone]  Fax: [phone] Website: [email or web address]

## 2023-12-29 NOTE — Telephone Encounter (Signed)
 Called and spoke w/pt's wife, told wife per Dr. Tanya Nones," I would try imodium to see if that helps the diarrhea."  Wife, voiced understanding.

## 2023-12-31 ENCOUNTER — Ambulatory Visit: Payer: Self-pay | Admitting: *Deleted

## 2023-12-31 NOTE — Patient Outreach (Signed)
 Care Coordination   Follow Up Visit Note   12/31/2023  Name: Jay Rivas MRN: [id number] DOB: Jun 12, 1951  Jay Rivas is a 73 y.o. year old male who sees Pickard, Priscille Heidelberg, MD for primary care. I spoke with patient's wife, Jay Rivas by phone today.  What matters to the patients health and wellness today?  Receive Assistance Obtaining In-Home Care Services.    Goals Addressed             This Visit's Progress    COMPLETED: Receive Assistance Obtaining In-Home Care Services.   On track    Care Coordination Interventions:  Interventions Today    Flowsheet Row Most Recent Value  Chronic Disease   Chronic disease during today's visit Hypertension (HTN), Other  [Myoclonus, Memory Disorder, Hyperlipidemia, Abnormal Gait, Caregiver Burnout, Stress, & Fatigue, Requesting Assistance Applying for Medicaid & Personal Care Services.]  General Interventions   General Interventions Discussed/Reviewed General Interventions Discussed, Labs, Vaccines, Doctor Visits, Level of Care, Communication with, Health Screening, Annual Foot Exam, Lipid Profile, General Interventions Reviewed, Sick Day Rules, Community Resources, Horticulturist, commercial (DME), Annual Eye Exam  [Encouraged Routine Engagement with Care Team Members & Providers.]  Labs Hgb A1c every 3 months, Kidney Function, Hgb A1c annually  [Encouraged Routine Lab Work.]  Vaccines COVID-19, Flu, Pneumonia, RSV, Shingles, Tetanus/Pertussis/Diphtheria  [Encouraged Annual Vaccinations.]  Doctor Visits Discussed/Reviewed Doctor Visits Discussed, Specialist, Doctor Visits Reviewed, Annual Wellness Visits, PCP  [Encouraged Routine Engagement with Care Team Members & Providers.]  Health Screening Bone Density, Colonoscopy, Prostate  [Encouraged Routine Health Screenings.]  Durable Medical Equipment (DME) BP Cuff, Other  [Cane, Scales, Hand-Held Shower Hose, Engineer, materials in [employer], Prescription Eyeglasses.]  PCP/Specialist Visits Compliance with  follow-up visit  [Encouraged Routine Engagement with Care Team Members & Providers.]  Communication with PCP/Specialists, RN, Pharmacists, Social Work  [employer] Routine Engagement with Care Team Members & Providers.]  Level of Care Adult Daycare, Personal Care Services, Applications, Assisted Living, Skilled Nursing Facility  [Confirmed Disinterest in Enrollment in Adult Day Care Program. Confirmed Disinterest in Pursuing Higher Level of Care Placement Options.]  Applications Medicaid, Personal Care Services  [place] Interest in Applying for Medicaid & Personal Care Services, Teaching laboratory technician & Offering to Assist with Completion & Submission.]  Exercise Interventions   Exercise Discussed/Reviewed Exercise Discussed, Assistive device use and maintanence, Exercise Reviewed, Physical Activity, Weight Managment  [Encouraged Increased Level of Activity & Exercise, Inside & Outside the Home.]  Physical Activity Discussed/Reviewed Physical Activity Discussed, Home Exercise Program (HEP), PREP, Physical Activity Reviewed, Gym, Types of exercise  [Encouraged Daily Exercise Regimen, as Tolerated.]  Weight Management Weight maintenance  [Encouraged Healthy Diet.]  Education Interventions   Education Provided Provided Therapist, sports, Provided Web-based Education, Provided Education  [employer] Reviewed Educational Material to [employer] & Entertain Questions.]  Provided Verbal Education On Nutrition, Mental Health/Coping with Illness, When to see the doctor, Foot Care, Eye Care, Applications, Labs, Exercise, Blood Sugar Monitoring, Medication, Development worker, community, [employer]  [Encouraged Continued Independent Review of Educational Material Provided.]  Labs Reviewed Hgb A1c  [Reviewed & Encouraged Routine Monitoring.]  Ship broker, Personal Care Services  [employer] Interest in Applying for [employer] & Personal Care Services, Teaching laboratory technician & Offering to Assist with  Completion & Submission.]  Mental Health Interventions   Mental Health Discussed/Reviewed Mental Health Discussed, Anxiety, Depression, Grief and Loss, Mental Health Reviewed, Substance Abuse, Coping Strategies, Suicide, Crisis, Other  [Assessed Mental Health & Cognitive Status.]  Nutrition Interventions   Nutrition  Discussed/Reviewed Nutrition Discussed, Adding fruits and vegetables, Increasing proteins, Decreasing fats, Nutrition Reviewed, Fluid intake, Decreasing salt, Portion sizes, Carbohydrate meal planning, Decreasing sugar intake, Supplemental nutrition  [Encouraged Heart-Healthy, Low Sodium, High Fiber, Protein-Rich Diet.]  Pharmacy Interventions   Pharmacy Dicussed/Reviewed Pharmacy Topics Discussed, Medications and their functions, Medication Adherence, Pharmacy Topics Reviewed, Affording Medications  [Confirmed Ability to Afford Prescription Medications.]  Medication Adherence --  [Confirmed Compliance with Prescription Medications.]  Safety Interventions   Safety Discussed/Reviewed Safety Discussed, Safety Reviewed, Fall Risk, Home Safety  [Encouraged Routine Use of Assistive Devices & Durable Medical Equipment.]  Home Safety Assistive Devices, Need for home safety assessment  [Encouraged Consideration of Home Safety Evaluation.]  Advanced Directive Interventions   Advanced Directives Discussed/Reviewed Advanced Directives Discussed, Advanced Directives Reviewed  [Confirmed Initiation of Advanced Directives (Living Will & Healthcare Power of Attorney Documents), Requesting Copies to Scan into Electronic Medical Record in Epic.]      Active Listening & Reflection Utilized.  Verbalization of Feelings Encouraged.  Emotional Support Provided. Problem Solving Interventions Implemented. Task-Centered Solutions Indicated.   Solution-Focused Strategies Revised. Acceptance & Commitment Therapy Performed. Cognitive Behavioral Therapy Conducted. Client-Centered Therapy Initiated. CSW  Collaboration with Wife, Doyne Micke to Encourage Engagement with Danford Bad, Licensed Clinical Social Worker with [facility], [facility] [phone]), if She Has Questions, Needs Assistance, Additional Social Work Needs Are Identified in The Near Future, or If She Changes Her Mind About Wanting to [employer] Social Work [employer].      SDOH assessments and interventions completed:  Yes.  Care Coordination Interventions:  Yes, provided.   Follow up plan: No further intervention required.   Encounter Outcome:  Patient Visit Completed.   Danford Bad, BSW, MSW, LCSW [facility], [facility] Clinical Social Worker II Direct Dial: [phone]  Fax: [phone] Website: [email or web address]

## 2023-12-31 NOTE — Patient Instructions (Signed)
 Visit Information  Thank you for taking time to visit with me today. Please don't hesitate to contact me if I can be of assistance to you.   Following are the goals we discussed today:   Goals Addressed             This Visit's Progress    COMPLETED: Receive Assistance Obtaining In-Home Care Services.   On track    Care Coordination Interventions:  Interventions Today    Flowsheet Row Most Recent Value  Chronic Disease   Chronic disease during today's visit Hypertension (HTN), Other  [Myoclonus, Memory Disorder, Hyperlipidemia, Abnormal Gait, Caregiver Burnout, Stress, & Fatigue, Requesting Assistance Applying for Medicaid & Personal Care Services.]  General Interventions   General Interventions Discussed/Reviewed General Interventions Discussed, Labs, Vaccines, Doctor Visits, Level of Care, Communication with, Health Screening, Annual Foot Exam, Lipid Profile, General Interventions Reviewed, Sick Day Rules, Community Resources, Horticulturist, commercial (DME), Annual Eye Exam  [Encouraged Routine Engagement with Care Team Members & Providers.]  Labs Hgb A1c every 3 months, Kidney Function, Hgb A1c annually  [Encouraged Routine Lab Work.]  Vaccines COVID-19, Flu, Pneumonia, RSV, Shingles, Tetanus/Pertussis/Diphtheria  [Encouraged Annual Vaccinations.]  Doctor Visits Discussed/Reviewed Doctor Visits Discussed, Specialist, Doctor Visits Reviewed, Annual Wellness Visits, PCP  [Encouraged Routine Engagement with Care Team Members & Providers.]  Health Screening Bone Density, Colonoscopy, Prostate  [Encouraged Routine Health Screenings.]  Durable Medical Equipment (DME) BP Cuff, Other  [Cane, Scales, Hand-Held Shower Hose, Engineer, materials in [employer], Prescription Eyeglasses.]  PCP/Specialist Visits Compliance with follow-up visit  [Encouraged Routine Engagement with Care Team Members & Providers.]  Communication with PCP/Specialists, RN, Pharmacists, Social Work  [employer] Routine Engagement with  Care Team Members & Providers.]  Level of Care Adult Daycare, Personal Care Services, Air traffic controller, Assisted Living, Skilled Nursing Facility  [Confirmed Disinterest in Enrollment in Adult Day Care Program. Confirmed Disinterest in Pursuing Higher Level of Care Placement Options.]  Applications Medicaid, Personal Care Services  [place] Interest in Applying for Medicaid & Personal Care Services, Teaching laboratory technician & Offering to Assist with Completion & Submission.]  Exercise Interventions   Exercise Discussed/Reviewed Exercise Discussed, Assistive device use and maintanence, Exercise Reviewed, Physical Activity, Weight Managment  [Encouraged Increased Level of Activity & Exercise, Inside & Outside the Home.]  Physical Activity Discussed/Reviewed Physical Activity Discussed, Home Exercise Program (HEP), PREP, Physical Activity Reviewed, Gym, Types of exercise  [Encouraged Daily Exercise Regimen, as Tolerated.]  Weight Management Weight maintenance  [Encouraged Healthy Diet.]  Education Interventions   Education Provided Provided Therapist, sports, Provided Web-based Education, Provided Education  [employer] Reviewed Educational Material to [employer] & Entertain Questions.]  Provided Verbal Education On Nutrition, Mental Health/Coping with Illness, When to see the doctor, Foot Care, Eye Care, Applications, Labs, Exercise, Blood Sugar Monitoring, Medication, Development worker, community, [employer]  [Encouraged Continued Independent Review of Educational Material Provided.]  Labs Reviewed Hgb A1c  [Reviewed & Encouraged Routine Monitoring.]  Ship broker, Personal Care Services  [employer] Interest in Applying for [employer] & Personal Care Services, Teaching laboratory technician & Offering to Assist with Completion & Submission.]  Mental Health Interventions   Mental Health Discussed/Reviewed Mental Health Discussed, Anxiety, Depression, Grief and Loss, Mental Health Reviewed, Substance Abuse,  Coping Strategies, Suicide, Crisis, Other  [Assessed Mental Health & Cognitive Status.]  Nutrition Interventions   Nutrition Discussed/Reviewed Nutrition Discussed, Adding fruits and vegetables, Increasing proteins, Decreasing fats, Nutrition Reviewed, Fluid intake, Decreasing salt, Portion sizes, Carbohydrate meal planning, Decreasing sugar intake, Supplemental nutrition  [Encouraged Heart-Healthy, Low Sodium,  High Fiber, Protein-Rich Diet.]  Pharmacy Interventions   Pharmacy Dicussed/Reviewed Pharmacy Topics Discussed, Medications and their functions, Medication Adherence, Pharmacy Topics Reviewed, Affording Medications  [Confirmed Ability to Afford Prescription Medications.]  Medication Adherence --  [Confirmed Compliance with Prescription Medications.]  Safety Interventions   Safety Discussed/Reviewed Safety Discussed, Safety Reviewed, Fall Risk, Home Safety  [Encouraged Routine Use of Assistive Devices & Durable Medical Equipment.]  Home Safety Assistive Devices, Need for home safety assessment  [Encouraged Consideration of Home Safety Evaluation.]  Advanced Directive Interventions   Advanced Directives Discussed/Reviewed Advanced Directives Discussed, Advanced Directives Reviewed  [Confirmed Initiation of Advanced Directives (Living Will & Healthcare Power of Attorney Documents), Requesting Copies to Scan into Electronic Medical Record in Epic.]      Active Listening & Reflection Utilized.  Verbalization of Feelings Encouraged.  Emotional Support Provided. Problem Solving Interventions Implemented. Task-Centered Solutions Indicated.   Solution-Focused Strategies Revised. Acceptance & Commitment Therapy Performed. Cognitive Behavioral Therapy Conducted. Client-Centered Therapy Initiated. CSW Collaboration with Wife, Tyquez Hollibaugh to Encourage Engagement with Danford Bad, Licensed Clinical Social Worker with [facility], [facility] [phone]),  if She Has Questions, Needs Assistance, Additional Social Work Needs Are Identified in The Near Future, or If She Changes Her Mind About Wanting to [employer] Social Work [employer].      Please call the care guide team at [phone] if you need to cancel or reschedule your appointment.   If you are experiencing a Mental Health or Behavioral Health Crisis or need someone to talk to, please call the Suicide and Crisis Lifeline: 988 call the [place] National Suicide Prevention Lifeline: (445) 471-1157 or TTY: 253-129-6884 TTY 534-526-1076) to talk to a trained counselor call 1-800-273-TALK (toll free, 24 hour hotline) go to [facility] [address], [place] [phone]) call the [facility] Crisis Line: 3053323168 call 911  Patient verbalizes understanding of instructions and care plan provided today and agrees to view in MyChart. Active MyChart status and patient understanding of how to access instructions and care plan via MyChart confirmed with patient.     No further follow up required.  Danford Bad, BSW, MSW, LCSW [facility], [facility] Clinical Social Worker II Direct Dial: [phone]  Fax: [phone] Website: [email or web address]

## 2024-01-04 ENCOUNTER — Telehealth: Payer: Self-pay

## 2024-01-04 NOTE — Telephone Encounter (Signed)
 Copied from CRM [id number]. Topic: General - Other >> Jan 04, 2024  1:41 PM Emylou G wrote: Reason for CRM: Darel Hong w/[facility].. Would like to get approval to speech therapy?  Pls call her back at [phone] anytime.. can take verbal if you don't reach her.

## 2024-01-06 ENCOUNTER — Telehealth: Payer: Self-pay | Admitting: Family Medicine

## 2024-01-06 NOTE — Telephone Encounter (Signed)
 Copied from CRM [id number]. Topic: Clinical - Medical Advice >> Jan 06, 2024 11:19 AM [name] wrote: Reason for CRM: Reason for Triage: [name] with [name] is calling to report a fall, with no injury to the patient per Jay Rivas

## 2024-01-19 ENCOUNTER — Other Ambulatory Visit: Payer: Self-pay

## 2024-01-19 ENCOUNTER — Telehealth: Payer: Self-pay

## 2024-01-19 DIAGNOSIS — R269 Unspecified abnormalities of gait and mobility: Secondary | ICD-10-CM

## 2024-01-19 DIAGNOSIS — R413 Other amnesia: Secondary | ICD-10-CM

## 2024-01-19 DIAGNOSIS — R410 Disorientation, unspecified: Secondary | ICD-10-CM

## 2024-01-19 NOTE — Telephone Encounter (Signed)
 Copied from CRM [id number]. Topic: General - Other >> Jan 19, 2024  9:58 AM Emylou G wrote: Reason for CRM: Annette w/[facility] called requesting 3 and 1 bedside commode.. pls call her back [phone]

## 2024-01-27 ENCOUNTER — Telehealth: Payer: Self-pay

## 2024-01-27 NOTE — Telephone Encounter (Signed)
 Copied from CRM [id number]. Topic: General - Other >> Jan 27, 2024 10:12 AM Eunice Blase wrote: Reason for CRM: Received call from [facility] per Loraine Maple ph: [phone] stated pt had two falls with no injury.

## 2024-02-15 ENCOUNTER — Ambulatory Visit: Payer: Self-pay

## 2024-02-15 NOTE — Telephone Encounter (Signed)
 Jay Rivas with [facility] called to report patient had a fall. Jay Rivas is not with patient at this time. Patient fell yesterday while walking behind his wife through the living room to leave the home. Patient did not hit his head on the ground and did not suffer any injuries aside from a bruise to his right hand when it hit the table. Jay Rivas did a full assessment of patient and no injuries noted.   Copied from CRM [id number]. Topic: Clinical - Red Word Triage >> Feb 15, 2024 12:21 PM Alpha Arts wrote: Red Word that prompted transfer to Nurse Triage: [facility] wanted to report Patient fell causing bruising to his right hand. Answer Assessment - Initial Assessment Questions 1. MECHANISM: "How did the fall happen?"     "Walking through living room and fell" 2. DOMESTIC VIOLENCE AND ELDER ABUSE SCREENING: "Did you fall because someone pushed you or tried to hurt you?" If Yes, ask: "Are you safe now?"     No 3. ONSET: "When did the fall happen?" (e.g., minutes, hours, or days ago)     Sunday 4. LOCATION: "What part of the body hit the ground?" (e.g., back, buttocks, head, hips, knees, hands, head, stomach)     Right hand  5. INJURY: "Did you hurt (injure) yourself when you fell?" If Yes, ask: "What did you injure? Tell me more about this?" (e.g., body area; type of injury; pain severity)"     Bruised 6. PAIN: "Is there any pain?" If Yes, ask: "How bad is the pain?" (e.g., Scale 1-10; or mild,  moderate, severe)   - NONE (0): No pain   - MILD (1-3): Doesn't interfere with normal activities    - MODERATE (4-7): Interferes with normal activities or awakens from sleep    - SEVERE (8-10): Excruciating pain, unable to do any normal activities      No 7. SIZE: For cuts, bruises, or swelling, ask: "How large is it?" (e.g., inches or centimeters)      Bruises 9. OTHER SYMPTOMS: "Do you have any other symptoms?" (e.g., dizziness, fever, weakness; new onset or worsening).      N/a 10. CAUSE:  "What do you think caused the fall (or falling)?" (e.g., tripped, dizzy spell)       N/a  Protocols used: Falls and [facility]

## 2024-02-17 ENCOUNTER — Ambulatory Visit: Payer: Self-pay | Admitting: *Deleted

## 2024-02-17 NOTE — Telephone Encounter (Signed)
 Call from [facility], therapist from [facility] saw patient yesterday and not with patient now.   Chief Complaint: declined in mental and physical health. Patient unable to verbalized his name , worsening fatigue. Reports patient family not compliant with giving Parkinson's medication.  Symptoms: unable to verbalized patient's own name. Fatigue worsening, tremors worsening, unable to walk as previously done.  Frequency: yesterday  Pertinent Negatives: Patient denies chest pain no difficulty breathing no fever no weakness either side of body reported.  Disposition: [] ED /[] Urgent Care (no appt availability in office) / [] Appointment(In office/virtual)/ []  [facility]/ [] Home Care/ [] Refused Recommended Disposition /[] [facility]/ [x]  Follow-up with PCP Additional Notes:   Please advise , Murlean Armour not with patient now and reports patient is going to appt today per family , with "holistic dr". Advised PTO if decline in cognition and physical health recommended ED. Murlean Armour reports family does not understand need for OV or ED visit at this time. Please advise.         Copied from CRM [id number]. Topic: Clinical - Red Word Triage >> Feb 17, 2024  9:01 AM Crispin Dolphin wrote: Red Word that prompted transfer to Nurse Triage: Cognitive decline, Alertness not there, not acting like himself, doesn't know his name, in denial. Declining Reason for Disposition  [1] Worsening confusion AND [2] gradual onset (days to weeks)  Answer Assessment - Initial Assessment Questions 1. MAIN CONCERN OR SYMPTOM:  "What is your main concern right now?" "What questions do you have?" "What's the main symptom you're worried about?" (e.g., confusion, memory loss)     Annette ,PTO not with patient now,reports worsening decline in mental and physical health. Patient unable to verbalize his name , no able to walk with therapist as previously done. Worsening tremors. Fatigue worsening when attempting to  walk 2. ONSET:  "When did the symptom start (or worsen)?" (minutes, hours, days, weeks)     Noted yesterday during PT visit 3. BETTER-SAME-WORSE: "Are you (the patient) getting better, staying the same, or getting worse compared to the day you (they) were diagnosed or most recent hospital discharge ?"     Worse  4. DIAGNOSIS: "Was the dementia diagnosed by a doctor?" If Yes, ask: "When?" (e.g., days, months, years ago)     Yes  5. MEDICINES: "Has there been any change in medicines recently?" (e.g., narcotics, antihistamines, benzodiazepines, etc.)     Therapist feels family is non compliant with giving patient medications. 6. OTHER SYMPTOMS: "Are there any other symptoms?" (e.g., fever, cough, pain, falling)     Decline in mental / physical health 7. SUPPORT: Document living circumstances and support (e.g., family, nursing home)     Lives with wife  Protocols used: Dementia Symptoms and Questions-A-AH

## 2024-03-23 ENCOUNTER — Telehealth: Payer: Self-pay | Admitting: Family Medicine

## 2024-03-23 ENCOUNTER — Telehealth: Payer: Self-pay

## 2024-03-23 NOTE — Telephone Encounter (Signed)
 Copied from CRM [id number]. Topic: Clinical - Home Health Verbal Orders >> Mar 22, 2024  3:20 PM Lizabeth Riggs wrote: Caller/Agency: Marily Shows with [facility] Callback Number: [phone] She has a secured voicemail so you can leave a message Service Requested: Speech Therapy Frequency: 1 time week for 5 weeks Any new concerns about the patient? No

## 2024-03-23 NOTE — Telephone Encounter (Signed)
 VO given to Jay Rivas for ST and [facility]. Mjp,lpn  Copied from CRM [id number]. Topic: Clinical - Home Health Verbal Orders >> Mar 23, 2024  9:25 AM [name] wrote: Caller/Agency: Jay Rivas Speech Therapist with [facility]  Callback Number: [phone]  Service Requested: Home Health Aide Frequency: She is requesting orders for a home health aide as the patient's wife is having difficulty caring for him on her own. She states she can take a verbal order.  Any new concerns about the patient? No

## 2024-04-01 ENCOUNTER — Telehealth: Payer: Self-pay

## 2024-04-01 NOTE — Telephone Encounter (Signed)
 Copied from CRM [id number]. Topic: General - Other >> Apr 01, 2024  4:22 PM Sasha H wrote: Reason for CRM: [facility] called to inform office that they received a referral for this pts palliative care and will follow up with office if any new concerns.

## 2024-04-07 ENCOUNTER — Telehealth: Payer: Self-pay

## 2024-04-07 NOTE — Telephone Encounter (Signed)
 Copied from CRM [id number]. Topic: General - Other >> Apr 07, 2024 11:28 AM Tiffany S wrote: Reason for CRM: Patient wife is stating that patient condition is not getting better asking for Dr Cheril Cork nurse to give her a call

## 2024-04-11 NOTE — Telephone Encounter (Signed)
 Attempted to contact pt. No answer. Lvm for call back

## 2024-04-12 NOTE — Telephone Encounter (Signed)
 Pt. Wife came in to office and was given information from Dr. Shirline Dover note. She will work on getting a second opinion

## 2024-04-20 ENCOUNTER — Telehealth: Payer: Self-pay

## 2024-04-20 NOTE — Telephone Encounter (Signed)
Scheduled appt for Friday 

## 2024-04-20 NOTE — Telephone Encounter (Signed)
 Copied from CRM [id number]. Topic: General - Other >> Apr 20, 2024 12:18 PM Sophia H wrote: Reason for CRM: Spoke with the patients son  Jay Rivas - Medical POA [phone] who is calling in on behalf of the patient, he is being moved to [facility] next week but needing the following done before hand.   Will need an appointment for [facility] form (will bring in updated list of medications for the patient as he is not taking everything on his list), DNR and orders for TB testing put in. Would like to come in for everything on the same day, please advise. Decl scheduling right now, Contact Jay Rivas (spouse) [phone] for scheduling

## 2024-04-20 NOTE — Telephone Encounter (Signed)
 Lvm for pt son to call back.

## 2024-04-22 ENCOUNTER — Encounter: Payer: Self-pay | Admitting: Family Medicine

## 2024-04-22 ENCOUNTER — Ambulatory Visit (INDEPENDENT_AMBULATORY_CARE_PROVIDER_SITE_OTHER): Admitting: Family Medicine

## 2024-04-22 VITALS — BP 120/82 | HR 64 | Temp 97.7°F | Ht 68.0 in | Wt 140.0 lb

## 2024-04-22 DIAGNOSIS — G3185 Corticobasal degeneration: Secondary | ICD-10-CM | POA: Diagnosis not present

## 2024-04-22 DIAGNOSIS — Z111 Encounter for screening for respiratory tuberculosis: Secondary | ICD-10-CM

## 2024-04-22 NOTE — Progress Notes (Signed)
 Subjective:    Patient ID: Jay Rivas, male    DOB: 1951-07-05, 73 y.o.   MRN: [id number]   Patient has a history of corticobasal degeneration.  Unfortunately he is becoming progressively more confused.  He is now having trouble moving his right arm and his right leg.  He has bilateral tremors in his arm.  His wife is having a difficult time caring for him.  He has a hard time following commands.  Per her report, the family has not administered the Sinemet  due to fears of side effects.  At this point, they are pursuing skilled nursing facility care.  Patient request to be made a DNR.  We had this discussion today in the office.  He understands the implications of this decision and this is his wish.  He denies any cough or chest pain or shortness of breath.  He denies any abdominal pain.  Examination of his sacrum and gluteal area show no evidence of any decubitus ulcer.  Patient is occasionally incontinent of urine and stool and has to wear adult diapers.  He has a difficult time standing and walking.  He frequently freezes and loses his ability to walk.  However family has chosen not to attempt Sinemet .  He is due for TB screening. 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 Levern   Spinal stenosis of cervical region    Past Surgical History:  Procedure Laterality Date   ABDOMINAL SURGERY     CARDIAC CATHETERIZATION  2005   stent   CARPAL TUNNEL RELEASE Right    CARPAL TUNNEL RELEASE Right 03/09/2018   Procedure: RIGHT CARPAL TUNNEL RELEASE;  Surgeon: Murrell Arley, MD;  Location: [facility];  Service: Orthopedics;  Laterality: Right;   CARPAL TUNNEL RELEASE Left 11/02/2018   Procedure: LEFT CARPAL TUNNEL RELEASE;  Surgeon: Murrell Arley, MD;   Location: [facility];  Service: Orthopedics;  Laterality: Left;   TENDON TRANSFER Left 11/02/2018   Procedure: LEFT CAMITZ TRANSFER;  Surgeon: Murrell Arley, MD;  Location: [facility];  Service: Orthopedics;  Laterality: Left;   TRIGGER FINGER RELEASE Right 03/09/2018   Procedure: RELEASE TRIGGER FINGER/A-1 PULLEY RIGHT MIDDLE, RING AND SMALL FINGERS;  Surgeon: Murrell Arley, MD;  Location: [facility];  Service: Orthopedics;  Laterality: Right;   ULNAR NERVE TRANSPOSITION Left 11/02/2018   Procedure: LEFT ULNAR NERVE DECOMPRESSION;  Surgeon: Murrell Arley, MD;  Location: [facility];  Service: Orthopedics;  Laterality: Left;   ULNAR TUNNEL RELEASE Right 03/09/2018   Procedure: CUBITAL TUNNEL DECOMPRESSION RIGHT ELBOW;  Surgeon: Murrell Arley, MD;  Location: [facility];  Service: Orthopedics;  Laterality: Right;   Current Outpatient Medications on File Prior to Visit  Medication Sig Dispense Refill   aspirin EC 81 MG tablet Take 81 mg by mouth daily.     Coconut Oil 1000 MG CAPS Take by mouth.     donepezil  (ARICEPT ) 10 MG tablet TAKE 1 TABLET BY MOUTH EVERYDAY AT BEDTIME 90 tablet 3   losartan (COZAAR) 100 MG tablet Take 100 mg by mouth daily.     Omega-3 Fatty Acids (FISH OIL) 1000 MG CAPS Take by mouth.  carbidopa -levodopa  (SINEMET ) 25-250 MG tablet Take 1 tablet by mouth 3 (three) times daily. (Patient not taking: Reported on 04/22/2024) 90 tablet 3   Coenzyme Q10 (COQ-10) 200 MG CAPS Take by mouth. (Patient not taking: Reported on 04/22/2024)     hydrocortisone 2.5 % lotion Apply topically. (Patient not taking: Reported on 04/22/2024)     ketoconazole (NIZORAL) 2 % shampoo SMARTSIG:Topical 2-3 Times Weekly (Patient not taking: Reported on 04/22/2024)     rOPINIRole  (REQUIP ) 0.25 MG tablet Take by mouth. (Patient not taking: Reported on 04/22/2024)     No current facility-administered medications on file prior to visit.   Allergies   Allergen Reactions   Penicillin G Anaphylaxis   Social History   Socioeconomic History   Marital status: Married    Spouse name: Jeison Delpilar   Number of children: 2   Years of education: 12   Highest education level: High school graduate  Occupational History   Not on file  Tobacco Use   Smoking status: Former    Passive exposure: Past   Smokeless tobacco: Former   Tobacco comments:    quit 1990  Vaping Use   Vaping status: Never Used  Substance and Sexual Activity   Alcohol use: Not Currently    Alcohol/week: 24.0 standard drinks of alcohol    Types: 24 Cans of beer per week    Comment: social beer only   Drug use: No   Sexual activity: Yes    Partners: Female  Other Topics Concern   Not on file  Social History Narrative   Lives with wife, Albino.    1 son and 1 daughter.   2 grandchildren.   Right handed    Social Drivers of Health   Financial Resource Strain: Low Risk  (12/21/2023)   Overall Financial Resource Strain (CARDIA)    Difficulty of Paying Living Expenses: Not hard at all  Food Insecurity: No Food Insecurity (12/21/2023)   Hunger Vital Sign    Worried About Running Out of Food in the Last Year: Never true    Ran Out of Food in the Last Year: Never true  Transportation Needs: No Transportation Needs (12/21/2023)   PRAPARE - Administrator, Civil Service (Medical): No    Lack of Transportation (Non-Medical): No  Physical Activity: Sufficiently Active (12/21/2023)   Exercise Vital Sign    Days of Exercise per Week: 5 days    Minutes of Exercise per Session: 50 min  Stress: No Stress Concern Present (12/21/2023)   [employer] of Occupational Health - Occupational Stress Questionnaire    Feeling of Stress : Not at all  Social Connections: Socially Integrated (12/21/2023)   Social Connection and Isolation Panel    Frequency of Communication with Friends and Family: More than three times a week    Frequency of Social Gatherings with  Friends and Family: More than three times a week    Attends Religious Services: More than 4 times per year    Active Member of [employer] or Organizations: Yes    Attends Banker Meetings: More than 4 times per year    Marital Status: Married  Catering manager Violence: Not At Risk (12/21/2023)   Humiliation, Afraid, Rape, and Kick questionnaire    Fear of Current or Ex-Partner: No    Emotionally Abused: No    Physically Abused: No    Sexually Abused: No   History reviewed. No pertinent family history.     Review of Systems  All other systems reviewed and are negative.      Objective:   Physical Exam Vitals reviewed.  Constitutional:      General: He is not in acute distress.    Appearance: He is well-developed. He is not diaphoretic.  HENT:     Head: Normocephalic and atraumatic.     Right Ear: External ear normal.     Left Ear: External ear normal.     Nose: Nose normal.     Mouth/Throat:     Pharynx: No oropharyngeal exudate.   Eyes:     General: No scleral icterus.       Right eye: No discharge.        Left eye: No discharge.     Conjunctiva/sclera: Conjunctivae normal.     Pupils: Pupils are equal, round, and reactive to light.   Neck:     Thyroid : No thyromegaly.     Vascular: No JVD.   Cardiovascular:     Rate and Rhythm: Normal rate and regular rhythm.     Heart sounds: Normal heart sounds. No murmur heard.    No friction rub. No gallop.  Pulmonary:     Effort: Pulmonary effort is normal. No respiratory distress.     Breath sounds: Normal breath sounds. No wheezing or rales.  Chest:     Chest wall: No tenderness.  Abdominal:     General: Bowel sounds are normal. There is no distension.     Palpations: Abdomen is soft. There is no mass.     Tenderness: There is no abdominal tenderness. There is no guarding or rebound.   Musculoskeletal:     Cervical back: Normal range of motion and neck supple.  Lymphadenopathy:     Cervical: No cervical  adenopathy.   Neurological:     Mental Status: He is alert. He is disoriented.     Cranial Nerves: No cranial nerve deficit.     Sensory: Sensation is intact.     Motor: Weakness and tremor present.     Coordination: Romberg sign positive. Coordination abnormal. Finger-Nose-Finger Test abnormal.     Gait: Gait abnormal.     Deep Tendon Reflexes: Reflexes are normal and symmetric.   Psychiatric:        Behavior: Behavior normal.        Thought Content: Thought content normal.        Judgment: Judgment normal.   Patient very stiff in his upper and lower extremities.  He has a cogwheel rigidity in both arms.  Right arm is much worse than left arm.  He also has a difficult time following commands.     Assessment & Plan:  Screening-pulmonary TB - Plan: QuantiFERON-TB Gold Plus, CBC with Differential/Platelet, Comprehensive metabolic panel with GFR  Corticobasal degeneration (HCC) - Plan: QuantiFERON-TB Gold Plus, CBC with Differential/Platelet, Comprehensive metabolic panel with GFR I believe that the patient would benefit from skilled nursing facility placement.  We discussed trying Sinemet  to help reduce the frequency of the freezing and to improve mobility.  However family is concerned about potential side effects.  Blood pressure today is acceptable.  I completed the patient's [place] 2 form.  Also completed the patient's DNR form.  I will screen the patient for pulmonary TB.  We discussed a MOST form but the family and patient prefer not to fill this out at the present time

## 2024-04-24 ENCOUNTER — Ambulatory Visit: Payer: Self-pay | Admitting: Family Medicine

## 2024-04-24 LAB — QUANTIFERON-TB GOLD PLUS
Mitogen-NIL: 7.39 [IU]/mL
NIL: 0.01 [IU]/mL
QuantiFERON-TB Gold Plus: NEGATIVE
TB1-NIL: 0 [IU]/mL
TB2-NIL: 0 [IU]/mL

## 2024-04-25 ENCOUNTER — Telehealth: Payer: Self-pay | Admitting: Family Medicine

## 2024-04-25 NOTE — Telephone Encounter (Signed)
 First attempt to reach out to pt. No answer. No vm pick up.

## 2024-04-25 NOTE — Telephone Encounter (Signed)
 Spoke to wife and gave results. She had questions that were sent to the provider for review .

## 2024-04-25 NOTE — Telephone Encounter (Signed)
 Copied from CRM [id number]. Topic: General - Other >> Apr 25, 2024  3:10 PM Emylou G wrote: Reason for CRM: Wife called - had questions about the paperwork - said was told when someone came by to pickup paperwork.. She need the pink form to be filled out and completed ( wife is unsure if she needs this done by Dr Duanne or is their alternative )

## 2024-04-28 ENCOUNTER — Telehealth: Payer: Self-pay

## 2024-04-28 NOTE — Telephone Encounter (Signed)
 Dagoberto Jeffrey/ [facility]    Callback Number: [phone]    Service Requested: Speech Therapy    Frequency: Extension for 1x 4, education with safe swallow and diet modifications. Can leave verbal order on VM.                Contacted Judy by phone. Verbal order for above services given . 04/28/2024 10:52 am

## 2024-05-04 ENCOUNTER — Emergency Department (HOSPITAL_COMMUNITY)

## 2024-05-04 ENCOUNTER — Observation Stay (HOSPITAL_COMMUNITY)
Admit: 2024-05-04 | Discharge: 2024-05-04 | Disposition: A | Discharge status: Home / Self Care | Attending: Internal Medicine

## 2024-05-04 ENCOUNTER — Encounter (HOSPITAL_COMMUNITY): Payer: Self-pay | Admitting: *Deleted

## 2024-05-04 ENCOUNTER — Other Ambulatory Visit: Payer: Self-pay

## 2024-05-04 ENCOUNTER — Observation Stay (HOSPITAL_COMMUNITY)
Admission: EM | Admit: 2024-05-04 | Discharge: 2024-05-05 | Disposition: A | Discharge status: Home / Self Care | Attending: Internal Medicine | Admitting: Internal Medicine

## 2024-05-04 DIAGNOSIS — R4182 Altered mental status, unspecified: Secondary | ICD-10-CM | POA: Diagnosis present

## 2024-05-04 DIAGNOSIS — F02818 Dementia in other diseases classified elsewhere, unspecified severity, with other behavioral disturbance: Secondary | ICD-10-CM | POA: Insufficient documentation

## 2024-05-04 DIAGNOSIS — G9341 Metabolic encephalopathy: Secondary | ICD-10-CM | POA: Insufficient documentation

## 2024-05-04 DIAGNOSIS — I2510 Atherosclerotic heart disease of native coronary artery without angina pectoris: Secondary | ICD-10-CM | POA: Insufficient documentation

## 2024-05-04 DIAGNOSIS — G928 Other toxic encephalopathy: Secondary | ICD-10-CM | POA: Diagnosis not present

## 2024-05-04 DIAGNOSIS — Z7901 Long term (current) use of anticoagulants: Secondary | ICD-10-CM | POA: Insufficient documentation

## 2024-05-04 DIAGNOSIS — F0390 Unspecified dementia without behavioral disturbance: Secondary | ICD-10-CM | POA: Diagnosis not present

## 2024-05-04 DIAGNOSIS — G20C Parkinsonism, unspecified: Secondary | ICD-10-CM | POA: Diagnosis not present

## 2024-05-04 DIAGNOSIS — I10 Essential (primary) hypertension: Secondary | ICD-10-CM | POA: Diagnosis not present

## 2024-05-04 DIAGNOSIS — R400 Somnolence: Principal | ICD-10-CM

## 2024-05-04 DIAGNOSIS — Z79899 Other long term (current) drug therapy: Secondary | ICD-10-CM | POA: Diagnosis not present

## 2024-05-04 DIAGNOSIS — Z7982 Long term (current) use of aspirin: Secondary | ICD-10-CM | POA: Insufficient documentation

## 2024-05-04 DIAGNOSIS — R569 Unspecified convulsions: Principal | ICD-10-CM | POA: Insufficient documentation

## 2024-05-04 LAB — URINALYSIS, ROUTINE W REFLEX MICROSCOPIC
Bilirubin Urine: NEGATIVE
Glucose, UA: NEGATIVE mg/dL
Hgb urine dipstick: NEGATIVE
Ketones, ur: NEGATIVE mg/dL
Leukocytes,Ua: NEGATIVE
Nitrite: NEGATIVE
Protein, ur: NEGATIVE mg/dL
Specific Gravity, Urine: 1.009 (ref 1.005–1.030)
pH: 8 (ref 5.0–8.0)

## 2024-05-04 LAB — ETHANOL: Alcohol, Ethyl (B): <15 mg/dL (ref <15)

## 2024-05-04 LAB — CBC
HCT: 34.9 % — ABNORMAL LOW (ref 39.0–52.0)
Hemoglobin: 11.9 g/dL — ABNORMAL LOW (ref 13.0–17.0)
MCH: 32.2 pg (ref 26.0–34.0)
MCHC: 34.1 g/dL (ref 30.0–36.0)
MCV: 94.3 fL (ref 80.0–100.0)
Platelets: 227 K/uL (ref 150–400)
RBC: 3.7 MIL/uL — ABNORMAL LOW (ref 4.22–5.81)
RDW: 12.6 % (ref 11.5–15.5)
WBC: 5.8 K/uL (ref 4.0–10.5)
nRBC: 0 % (ref 0.0–0.2)

## 2024-05-04 LAB — DIFFERENTIAL
Abs Immature Granulocytes: 0.01 K/uL (ref 0.00–0.07)
Basophils Absolute: 0.1 K/uL (ref 0.0–0.1)
Basophils Relative: 1 %
Eosinophils Absolute: 0.6 K/uL — ABNORMAL HIGH (ref 0.0–0.5)
Eosinophils Relative: 10 %
Immature Granulocytes: 0 %
Lymphocytes Relative: 28 %
Lymphs Abs: 1.6 K/uL (ref 0.7–4.0)
Monocytes Absolute: 0.4 K/uL (ref 0.1–1.0)
Monocytes Relative: 8 %
Neutro Abs: 3.1 K/uL (ref 1.7–7.7)
Neutrophils Relative %: 53 %

## 2024-05-04 LAB — COMPREHENSIVE METABOLIC PANEL WITH GFR
ALT: 44 U/L (ref 0–44)
AST: 42 U/L — ABNORMAL HIGH (ref 15–41)
Albumin: 3.6 g/dL (ref 3.5–5.0)
Alkaline Phosphatase: 57 U/L (ref 38–126)
Anion gap: 14 (ref 5–15)
BUN: 13 mg/dL (ref 8–23)
CO2: 24 mmol/L (ref 22–32)
Calcium: 9.1 mg/dL (ref 8.9–10.3)
Chloride: 100 mmol/L (ref 98–111)
Creatinine, Ser: 1.1 mg/dL (ref 0.61–1.24)
GFR, Estimated: >60 mL/min (ref >60)
Glucose, Bld: 134 mg/dL — ABNORMAL HIGH (ref 70–99)
Potassium: 3.7 mmol/L (ref 3.5–5.1)
Sodium: 138 mmol/L (ref 135–145)
Total Bilirubin: 0.7 mg/dL (ref 0.0–1.2)
Total Protein: 6.2 g/dL — ABNORMAL LOW (ref 6.5–8.1)

## 2024-05-04 LAB — VITAMIN B12: Vitamin B-12: 287 pg/mL (ref 180–914)

## 2024-05-04 LAB — RAPID URINE DRUG SCREEN, HOSP PERFORMED
Amphetamines: NOT DETECTED
Barbiturates: NOT DETECTED
Benzodiazepines: NOT DETECTED
Cocaine: NOT DETECTED
Opiates: NOT DETECTED
Tetrahydrocannabinol: NOT DETECTED

## 2024-05-04 LAB — APTT: aPTT: 25 s (ref 24–36)

## 2024-05-04 LAB — PROTIME-INR
INR: 1 (ref 0.8–1.2)
Prothrombin Time: 13.9 s (ref 11.4–15.2)

## 2024-05-04 LAB — T4, FREE: Free T4: 0.69 ng/dL (ref 0.61–1.12)

## 2024-05-04 LAB — FOLATE: Folate: 8.4 ng/mL (ref >5.9)

## 2024-05-04 LAB — TSH: TSH: 6.474 u[IU]/mL — ABNORMAL HIGH (ref 0.350–4.500)

## 2024-05-04 LAB — CBG MONITORING, ED: Glucose-Capillary: 134 mg/dL — ABNORMAL HIGH (ref 70–99)

## 2024-05-04 MED ORDER — ACETAMINOPHEN 325 MG PO TABS: 650.0000 mg | ORAL_TABLET | ORAL | Status: DC | PRN

## 2024-05-04 MED ORDER — LORAZEPAM 2 MG/ML IJ SOLN
INTRAMUSCULAR | Status: AC
  Filled 2024-05-04: qty 1

## 2024-05-04 MED ORDER — ASPIRIN 81 MG PO TBEC
81.0000 mg | DELAYED_RELEASE_TABLET | Freq: Every day | ORAL | Status: DC
  Administered 2024-05-04 – 2024-05-05 (×2): 81 mg via ORAL
  Filled 2024-05-04 (×2): qty 1

## 2024-05-04 MED ORDER — ACETAMINOPHEN 160 MG/5ML PO SOLN: 650.0000 mg | ORAL | Status: DC | PRN

## 2024-05-04 MED ORDER — SENNOSIDES-DOCUSATE SODIUM 8.6-50 MG PO TABS: 1.0000 | ORAL_TABLET | Freq: Every evening | ORAL | Status: DC | PRN

## 2024-05-04 MED ORDER — STROKE: EARLY STAGES OF RECOVERY BOOK: Freq: Once | Status: AC

## 2024-05-04 MED ORDER — ENOXAPARIN SODIUM 40 MG/0.4ML IJ SOSY
40.0000 mg | PREFILLED_SYRINGE | INTRAMUSCULAR | Status: DC
Start: 2024-05-04 — End: 2024-05-05
  Administered 2024-05-04: 40 mg via SUBCUTANEOUS
  Filled 2024-05-04: qty 0.4

## 2024-05-04 MED ORDER — LORAZEPAM 2 MG/ML IJ SOLN
1.0000 mg | Freq: Once | INTRAMUSCULAR | Status: AC
  Administered 2024-05-04: 1 mg via INTRAVENOUS

## 2024-05-04 MED ORDER — SODIUM CHLORIDE 0.9 % IV SOLN: INTRAVENOUS | Status: DC

## 2024-05-04 MED ORDER — DONEPEZIL HCL 5 MG PO TABS
10.0000 mg | ORAL_TABLET | Freq: Every day | ORAL | Status: DC
  Administered 2024-05-04: 10 mg via ORAL
  Filled 2024-05-04: qty 2

## 2024-05-04 MED ORDER — ACETAMINOPHEN 650 MG RE SUPP: 650.0000 mg | RECTAL | Status: DC | PRN

## 2024-05-04 NOTE — Plan of Care (Signed)

## 2024-05-04 NOTE — Hospital Course (Addendum)
 73 year old male with a history of parkinsonism, dementia, cortical basilar degeneration, hypertension, coronary disease, hyperlipidemia presenting with seizure-like activity.  History is supplemented by review of the medical record and speaking with the patient's family at the bedside.  The patient currently resides at [facility].  The patient woke up in his usual state of health around 6:30 AM on 05/04/2024.  Around 7:40 AM, the patient was at breakfast when he developed tonic-clonic activity.  He is unclear of the duration.  Apparently the patient would not spit his food out and had the staff manually remove fluid from his mouth.  Apparently the patient was somnolent after the episode.  He remained somnolent upon arrival to the emergency department.  EMS was activated and code stroke was initiated prior to arrival.  Ativan  1 mg was given at 0830.  Since then, the patient has had improving mental status.  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  Son notes that his usual myoclonus has actually improved after being given Ativan .  The patient has not been on any new medications. At baseline, the patient has apraxia and dysphasia and dysphonia.  He is able to bear weight and take some steps with assistance.  He requires at least 1 person assistance for transfers.  There are no reports of emesis, diarrhea, uncontrolled pain, vomiting, respiratory distress.  The patient has been seen by [facility] neurology at [facility] in the past as an outpatient.  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  The patient has  had difficulty with repetitive movements.  Memantine was discontinued secondary to dizziness. Previously tried on Carbidopa -Levodopa  without benefit, caused cognitive issues referral to the movement disorder clinic was declined per previous documentation.  In the ED, the patient was afebrile and hemodynamically stable with oxygen saturation 96% room air.  WBC 5.8, hemoglobin 11.9, platelets 227.  Sodium 138, potassium 3.7, bicarbonate 24, serum creatinine 1.10.  AST 42, ALT 44, alk phos 357, total bilirubin 0.7.  Albumin 3.6, calcium  9.1.  INR 1.0.  CT of the brain was negative for acute findings.  MRI of the brain was negative for acute findings.  Chest x-ray was negative for any infiltrates or edema.  Neurology was consulted and recommended admission for workup of seizure and possible stroke.

## 2024-05-04 NOTE — ED Notes (Signed)
 Family at bedside.

## 2024-05-04 NOTE — ED Notes (Signed)
 After coming back to room, pt became more responsive and able to answer questions but is not oriented x3

## 2024-05-04 NOTE — ED Notes (Signed)
 Pt given liquid through pink mouth swabs, swallowed well. Son has cup of water and swab in the room.

## 2024-05-04 NOTE — TOC CM/SW Note (Signed)
 Transition of Care [facility]) - Inpatient Brief Assessment   Patient Details  Name: DEAVON PODGORSKI MRN: [id number] Date of Birth: [birth year]  Transition of Care [facility] - Inpatient) CM/SW Contact:    Noreen KATHEE Pinal, LCSWA Phone Number: 05/04/2024, 3:31 PM   Clinical Narrative:  Transition of Care Department [facility]) has reviewed patient and no TOC needs have been identified at this time. We will continue to monitor patient advancement through interdisciplinary progression rounds. If new patient transition needs arise, please place a TOC consult.   Transition of Care Asessment: Insurance and Status: Insurance coverage has been reviewed Patient has primary care physician: Yes Home environment has been reviewed: Single Family Home with Spouse Prior level of function:: Independent Prior/Current Home Services: No current home services Social Drivers of Health Review: SDOH reviewed no interventions necessary Readmission risk has been reviewed: Yes Transition of care needs: no transition of care needs at this time

## 2024-05-04 NOTE — ED Notes (Signed)
 Condom cath supplies ready for when pt comes back from MRI

## 2024-05-04 NOTE — Consult Note (Addendum)
 [facility] Telemedicine Consult   Requesting Provider: Dr Suzette Consult Participants: Dr. RONAL Lav, bedside RN-Tina Location of the provider: [facility] Location of the patient: [facility] ER bed 1  This consult was provided via telemedicine with 2-way video and audio communication. The patient/family was informed that care would be provided in this way and agreed to receive care in this manner.   Chief Complaint: Seizure-like activity, unresponsiveness  HPI: 73 year old with a history of atypical parkinsonism, corticobasal degeneration, dementia, hypertension, hyperlipidemia, CAD, presenting from a facility where he was woken up this morning at 6:30 AM, taken for breakfast and right around 7:40 AM noted to have seizure-like activity.  No good description of the seizure but it seemed like he had shaking all over.  He had food in his mouth that he would not spit out or swallow.  EMS was called.  They brought him in as a code stroke.  Telestroke was activated prior to arrival and he was evaluated at the [facility]. He is not able to provide any history. According to the EMS crew, he has a son was out of town and was not reachable at this time  Review of outside chart reveals significant amount of cognitive decline and dementia-past multiple visits over the past few years unable to complete SLUMS.  Has bradykinesia, Dementia with personality changes with apathy, agitation irritability and anxiety, as well as apraxia of bilateral hands right more than left, myoclonus in bilateral hands right greater than left.  Minimal cogwheeling and bradykinesia.  He has difficulty with repetitive movement and some history of neuropathy as well.  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 Levern   Spinal stenosis of cervical region     No current facility-administered medications for this encounter.  Current Outpatient Medications:    aspirin  EC 81 MG tablet, Take 81 mg by mouth daily., Disp: , Rfl:    carbidopa -levodopa  (SINEMET ) 25-250 MG tablet, Take 1 tablet by mouth 3 (three) times daily. (Patient not taking: Reported on 04/22/2024), Disp: 90 tablet, Rfl: 3   Coconut Oil 1000 MG CAPS, Take by mouth., Disp: , Rfl:    Coenzyme Q10 (COQ-10) 200 MG CAPS, Take by mouth. (Patient not taking: Reported on 04/22/2024), Disp: , Rfl:    donepezil  (ARICEPT ) 10 MG tablet, TAKE 1 TABLET BY MOUTH EVERYDAY AT BEDTIME, Disp: 90 tablet, Rfl: 3   hydrocortisone 2.5 % lotion, Apply topically. (Patient not taking: Reported on 04/22/2024), Disp: , Rfl:    ketoconazole (NIZORAL) 2 % shampoo, SMARTSIG:Topical 2-3 Times Weekly (Patient not taking: Reported on 04/22/2024), Disp: , Rfl:    losartan (COZAAR) 100 MG tablet, Take 100 mg by mouth daily., Disp: , Rfl:    Omega-3 Fatty Acids (FISH OIL) 1000 MG CAPS, Take by mouth., Disp: , Rfl:    rOPINIRole  (REQUIP ) 0.25 MG tablet, Take by mouth. (Patient not taking: Reported on 04/22/2024), Disp: , Rfl:     LKW: 6:30 AM IV thrombolysis given?: No, likely due to stroke, more likely to be seizure versus toxic metabolic encephalopathy versus exaggeration of myoclonus in the setting of metabolic derangements IR Thrombectomy? No, poor modified Rankin Modified Rankin Scale: 4-Needs  assistance to walk and tend to bodily needs, maybe 5 Time of teleneurologist evaluation: 8:12 AM  Exam: Vitals:   05/04/24 0845  BP: (!) 143/74  Pulse: 64  Resp: 15  SpO2: 93%   Heart rate in the 60s Blood pressure 139/54 CBG 134  Neurological exam Seems awake but somewhat drowsy.  Keeps eyes open.  Does not follow commands but tries to track the staff at bedside.  Nonverbal.  Seems to have somewhat of a leftward  gaze preference.  Does not blink to threat from right.  Blinks to threat from the left.  Face also appears somewhat asymmetric but weakness seems to be on the left face-could be just edentulousness.  He got both his arms antigravity with some drift in both-weakness is worse in the right upper extremity than the left and also same for the lower extremities. After the CT, when he was taken to the room, he had some improvement in his mentation.  He was able to nod yes and no to the nurse, in a very appropriate manner when they asked if he is hurting or if he knew where he was.  He still continued to have somewhat of an asymmetric exam with more right sided weakness along with myoclonic jerking and to a lesser extent weakness and myoclonic jerking in the left upper and lower extremity as well.  NIHSS 1A: Level of Consciousness - 1 1B: Ask Month and Age - 2 1C: 'Blink Eyes' & 'Squeeze Hands' - 1 2: Test Horizontal Extraocular Movements - 1 3: Test Visual Fields - 1 4: Test Facial Palsy - 1 5A: Test Left Arm Motor Drift - 1 5B: Test Right Arm Motor Drift - 2 6A: Test Left Leg Motor Drift - 1 6B: Test Right Leg Motor Drift - 2 7: Test Limb Ataxia - 2 8: Test Sensation - 0 9: Test Language/Aphasia- 2 10: Test Dysarthria - 0 11: Test Extinction/Inattention - 0 NIHSS score: 17   Imaging Reviewed personally: CT head-no acute changes.  CT angio not performed as he is not a candidate for thrombectomy due to his poor baseline  Labs reviewed in epic and pertinent values follow: Capillary Blood glucose 134     Latest Ref Rng & Units 09/01/2023   12:48 PM 01/09/2022    2:17 PM 02/10/2020   10:35 AM  CBC  WBC 3.8 - 10.8 Thousand/uL 5.3  4.5  5.0   Hemoglobin 13.2 - 17.1 g/dL 87.6  87.2  85.9   Hematocrit 38.5 - 50.0 % 36.2  37.0  39.4   Platelets 140 - 400 Thousand/uL 228  202  222       Latest Ref Rng & Units 09/01/2023   12:48 PM 02/10/2020   10:35 AM 11/04/2019    9:33 AM  CMP  Glucose 65 - 99  mg/dL 898  893  892   BUN 7 - 25 mg/dL 14  10  12    Creatinine 0.70 - 1.28 mg/dL 8.97  9.10  9.07   Sodium 135 - 146 mmol/L 141  143  142   Potassium 3.5 - 5.3 mmol/L 3.8  4.7  4.4   Chloride 98 - 110 mmol/L 105  105  103   CO2 20 - 32 mmol/L 30  31  29    Calcium  8.6 - 10.3 mg/dL 9.6  89.8  9.7   Total Protein 6.1 - 8.1 g/dL 6.7  6.8  6.8   Total Bilirubin 0.2 - 1.2 mg/dL 0.7  0.9  0.9   AST 10 - 35 U/L 12  21  18    ALT 9 - 46 U/L 12  20  15         Assessment: 73 year old with atypical parkinsonism, corticobasal degeneration, dementia, hypertension, hyperlipidemia, CAD brought in for concern for seizure-like activity from the facility.  Exam reveals awake but somewhat drowsy, very frail elderly looking man, who intermittently followed commands after little while-did not follow commands initially, and has weakness in all 4 extremities with somewhat of right sided weakness worse than left along with myoclonic activity on stimulation.  This seems to fall in line with his prior documented exam on his outpatient neurology appointment although the weakness and myoclonus seems a little bit more worse.  Top of the differentials include seizure followed by postictal state versus status epilepticus.  Toxic metabolic encephalopathy also remains in the differential.   Stroke is lower in the differential, and given his significant baseline debility along with lower likelihood of this being stroke-TNK was not offered.  He is not a candidate for thrombectomy due to poor baseline.   Recommendations:  Admit for observation at [facility] One-time dose of Ativan  given. Check UA, chest x-ray. Check CBC, BMP. MRI brain without contrast EEG.  If there is any further seizure activity or EEG is abnormal, will consider starting antiepileptic medication. If the MRI is positive for stroke, will need full stroke risk factor workup. Review of outpatient neurology chart reveals that the team recommended palliative  consult for possible pursuing comfort measures, which I think might be appropriate based on my chart review and his current presentation.  I would recommend getting palliative care involved sooner rather than later in his care. Plan was discussed with Dr. Zammit over the phone  Dr. Shelton to follow EEG and test results and provide final recs via routing tele follow up tomorrow.  -- Eligio Lav, MD Neurologist [facility] Pager: [phone]    CRITICAL CARE ATTESTATION Performed by: Eligio Lav, MD Total critical care time: 40 minutes Critical care time was exclusive of separately billable procedures and treating other patients and/or supervising APPs/Residents/Students Critical care was necessary to treat or prevent imminent or life-threatening deterioration. This patient is critically ill and at significant risk for neurological worsening and/or death and care requires constant monitoring. Critical care was time spent personally by me on the following activities: development of treatment plan with patient and/or surrogate as well as nursing, discussions with consultants, evaluation of patient's response to treatment, examination of patient, obtaining history from patient or surrogate, ordering and performing treatments and interventions, ordering and review of laboratory studies, ordering and review of radiographic studies, pulse oximetry, re-evaluation of patient's condition, participation in multidisciplinary rounds and medical decision making of high complexity in the care of this patient.

## 2024-05-04 NOTE — Procedures (Signed)
 Patient Name: RUFFUS KAMAKA  MRN: [id number]  Epilepsy Attending: Arlin MALVA Krebs  Referring Physician/Provider: Evonnie Lenis, MD  Date: 05/04/2024 Duration: 22.22 mins  Patient history: 72yo M with ams and seizure. EEG to evaluate for seizure.  Level of alertness: Awake  AEDs during EEG study: Ativan   Technical aspects: This EEG study was done with scalp electrodes positioned according to the 10-20 International system of electrode placement. Electrical activity was reviewed with band pass filter of 1-70Hz , sensitivity of 7 uV/mm, display speed of 86mm/sec with a 60Hz  notched filter applied as appropriate. EEG data were recorded continuously and digitally stored.  Video monitoring was available and reviewed as appropriate.  Description: EEG showed continuous generalized predominantly 5 to 7 Hz theta slowing admixed with intermittent 2-3hz  delta slowing. Hyperventilation and photic stimulation were not performed.     ABNORMALITY - Continuous slow, generalized  IMPRESSION: This study is suggestive of moderate diffuse encephalopathy. No seizures or epileptiform discharges were seen throughout the recording.  Michiko Lineman O Sheyli Horwitz

## 2024-05-04 NOTE — ED Provider Notes (Signed)
 [place] EMERGENCY DEPARTMENT AT [facility] Provider Note   CSN: [id number] Arrival date & time: 05/04/24  9186  An emergency department physician performed an initial assessment on this suspected stroke patient at [phone].  Patient presents with: Code Stroke   Jay Rivas is a 73 y.o. male.   Patient has a history of Parkinson's disease and potentially had a seizure today.  The history is provided by the nursing home. No language interpreter was used.  Altered Mental Status Presenting symptoms: behavior changes and confusion   Severity:  Moderate Most recent episode:  Today Episode history:  Continuous Timing:  Constant Chronicity:  New Context: not alcohol use        Prior to Admission medications   Medication Sig Start Date End Date Taking? Authorizing Provider  aspirin  EC 81 MG tablet Take 81 mg by mouth daily.   Yes [provider]  Chlorpheniramine Maleate (ALLERGY PO) Take 180 mg by mouth daily as needed (allergies).   Yes [provider]  Cholecalciferol (QC VITAMIN D3) 25 MCG (1000 UT) tablet Take 1,000 Units by mouth daily.   Yes [provider]  donepezil  (ARICEPT ) 10 MG tablet TAKE 1 TABLET BY MOUTH EVERYDAY AT BEDTIME 11/24/23  Yes Duanne Butler DASEN, MD  losartan  (COZAAR ) 100 MG tablet Take 100 mg by mouth daily. 11/08/21  Yes [provider]  Psyllium (METAMUCIL PO) Take 1 tablet by mouth daily.   Yes [provider]    Allergies: Penicillin g    Review of Systems  Unable to perform ROS: Mental status change  Psychiatric/Behavioral:  Positive for confusion.     Updated Vital Signs BP 136/83   Pulse (!) 58   Resp 13   Ht 5' 8 (1.727 m)   Wt 63.5 kg   SpO2 96%   BMI 21.29 kg/m   Physical Exam Vitals and nursing note reviewed.  Constitutional:      Appearance: He is well-developed.     Comments: Lethargic  HENT:     Head: Normocephalic.     Nose: Nose normal.  Eyes:     General: No  scleral icterus.    Conjunctiva/sclera: Conjunctivae normal.  Neck:     Thyroid : No thyromegaly.  Cardiovascular:     Rate and Rhythm: Normal rate and regular rhythm.     Heart sounds: No murmur heard.    No friction rub. No gallop.  Pulmonary:     Breath sounds: No stridor. No wheezing or rales.  Chest:     Chest wall: No tenderness.  Abdominal:     General: There is no distension.     Tenderness: There is no abdominal tenderness. There is no rebound.  Musculoskeletal:        General: Normal range of motion.     Cervical back: Neck supple.  Lymphadenopathy:     Cervical: No cervical adenopathy.  Skin:    Findings: No erythema or rash.  Neurological:     Motor: No abnormal muscle tone.     Coordination: Coordination normal.     Comments: Oriented to person only  Psychiatric:        Behavior: Behavior normal.     (all labs ordered are listed, but only abnormal results are displayed) Labs Reviewed  CBC - Abnormal; Notable for the following components:      Result Value   RBC 3.70 (*)    Hemoglobin 11.9 (*)    HCT 34.9 (*)    All  other components within normal limits  DIFFERENTIAL - Abnormal; Notable for the following components:   Eosinophils Absolute 0.6 (*)    All other components within normal limits  COMPREHENSIVE METABOLIC PANEL WITH GFR - Abnormal; Notable for the following components:   Glucose, Bld 134 (*)    Total Protein 6.2 (*)    AST 42 (*)    All other components within normal limits  URINALYSIS, ROUTINE W REFLEX MICROSCOPIC - Abnormal; Notable for the following components:   Color, Urine STRAW (*)    All other components within normal limits  CBG MONITORING, ED - Abnormal; Notable for the following components:   Glucose-Capillary 134 (*)    All other components within normal limits  ETHANOL  PROTIME-INR  APTT  RAPID URINE DRUG SCREEN, HOSP PERFORMED    EKG: None  Radiology: MR BRAIN WO CONTRAST Result Date: 05/04/2024 CLINICAL DATA:  Mental  status change, unknown cause. EXAM: MRI HEAD WITHOUT CONTRAST TECHNIQUE: Multiplanar, multiecho pulse sequences of the brain and surrounding structures were obtained without intravenous contrast. COMPARISON:  Earlier same day CT head. FINDINGS: Brain: No acute infarct. No evidence of intracranial hemorrhage. Mild T2/FLAIR hyperintensity in the periventricular and subcortical white matter. Generalized parenchymal volume loss. No edema, mass effect, or midline shift. Posterior fossa is unremarkable. Normal appearance of midline structures. The basilar cisterns are patent. No extra-axial fluid collections. Ventricles: Prominence of the lateral ventricles suggestive of underlying parenchymal volume loss. Vascular: Skull base flow voids are visualized. Skull and upper cervical spine: No focal abnormality. Sinuses/Orbits: Mucosal thickening in the left maxillary sinus with a mucous retention cyst noted. Additional mucosal thickening in the ethmoid sinuses. Orbits are symmetric. Other: Mastoid air cells are clear. IMPRESSION: No acute intracranial abnormality. Mild chronic microvascular ischemic changes. Moderate generalized parenchymal volume loss. Electronically Signed   By: Donnice Mania M.D.   On: 05/04/2024 11:55   DG Chest Port 1 View Result Date: 05/04/2024 CLINICAL DATA:  weak EXAM: PORTABLE CHEST - 1 VIEW COMPARISON:  July 24, 2004 FINDINGS: Chronic coarsening of the pulmonary interstitium without overt pulmonary edema. No focal airspace consolidation, pleural effusion, or pneumothorax. No cardiomegaly. Mitraclip. Tortuous aorta with aortic atherosclerosis. No acute fracture or destructive lesions. Multilevel thoracic osteophytosis. IMPRESSION: No acute cardiopulmonary abnormality. Electronically Signed   By: Rogelia Myers M.D.   On: 05/04/2024 09:56   CT HEAD CODE STROKE WO CONTRAST Addendum Date: 05/04/2024 ADDENDUM REPORT: 05/04/2024 08:47 ADDENDUM: No evidence of an acute intracranial abnormality.  These results were called by telephone at the time of interpretation on 05/04/2024 at 8:46 am to provider Dr. Voncile, who verbally acknowledged these results. Electronically Signed   By: Rockey Childs D.O.   On: 05/04/2024 08:47   Result Date: 05/04/2024 CLINICAL DATA:  Code stroke. Neuro deficit, acute, stroke suspected. Right-sided weakness. EXAM: CT HEAD WITHOUT CONTRAST TECHNIQUE: Contiguous axial images were obtained from the base of the skull through the vertex without intravenous contrast. RADIATION DOSE REDUCTION: This exam was performed according to the departmental dose-optimization program which includes automated exposure control, adjustment of the mA and/or kV according to patient size and/or use of iterative reconstruction technique. COMPARISON:  Head CT 08/11/2023. FINDINGS: Brain: Cerebral atrophy. Prominence of the ventricles and sulci, which appears commensurate. There is no acute intracranial hemorrhage. No demarcated cortical infarct. No extra-axial fluid collection. No evidence of an intracranial mass. No midline shift. Vascular: No hyperdense vessel.  Atherosclerotic calcifications. Skull: No calvarial fracture or aggressive osseous lesion. Sinuses/Orbits: No mass or acute  finding within the imaged orbits. Mild mucosal thickening scattered within the paranasal sinuses at the imaged levels. ASPECTS (Alberta Stroke Program Early CT Score) - Ganglionic level infarction (caudate, lentiform nuclei, internal capsule, insula, M1-M3 cortex): 7 - Supraganglionic infarction (M4-M6 cortex): 3 Total score (0-10 with 10 being normal): 10 Attempts are being made to reach the ordering provider at this time. IMPRESSION: 1.  No evidence of an acute intracranial abnormality. 2. Generalized cerebral atrophy. Electronically Signed: By: Rockey Childs D.O. On: 05/04/2024 08:37     Procedures   Medications Ordered in the ED  LORazepam  (ATIVAN ) injection 1 mg (0 mg Intravenous See Procedure Record 05/04/24 9081)                                     Medical Decision Making Amount and/or Complexity of Data Reviewed Labs: ordered. Radiology: ordered.  Risk Decision regarding hospitalization.  Altered mental status with possible seizure.  Patient will be admitted medicine     Final diagnoses:  Somnolence    ED Discharge Orders     None          Suzette Pac, MD 05/07/24 [phone]

## 2024-05-04 NOTE — ED Triage Notes (Signed)
 Pt BIB [facility] from [facility] for code stroke  Staff told ems pt woke up at 0630 and was normal, went to breakfast and was in the middle of eating when they noticed he started shaking and became unreponsive. LKW was 0740  Pt arrived with left side droop and having trouble following commands  Cbg 134 BP 139/54 P 60's

## 2024-05-04 NOTE — Progress Notes (Signed)
 EEG complete, results are pending.

## 2024-05-04 NOTE — ED Notes (Signed)
 Patient transported to MRI

## 2024-05-04 NOTE — H&P (Signed)
 History and Physical    Patient: Jay Rivas FMW:[id number] DOB: [birth year] DOA: 05/04/2024 DOS: the patient was seen and examined on 05/04/2024 PCP: Duanne Butler DASEN, MD  Patient coming from: ALF/ILF  Chief Complaint:  Chief Complaint  Patient presents with   Code Stroke   HPI: Jay Rivas is a 73 year old male with a history of parkinsonism, dementia, cortical basilar degeneration, hypertension, coronary disease, hyperlipidemia presenting with seizure-like activity.  History is supplemented by review of the medical record and speaking with the patient's family at the bedside.  The patient currently resides at [facility].  The patient woke up in his usual state of health around 6:30 AM on 05/04/2024.  Around 7:40 AM, the patient was at breakfast when he developed tonic-clonic activity.  He is unclear of the duration.  Apparently the patient would not spit his food out and had the staff manually remove fluid from his mouth.  Apparently the patient was somnolent after the episode.  He remained somnolent upon arrival to the emergency department.  EMS was activated and code stroke was initiated prior to arrival.  Ativan  1 mg was given at 0830.  Since then, the patient has had improving mental status.  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  Son notes that his usual myoclonus has actually improved after being given Ativan .  The patient has not been on any new medications. At baseline, the patient has apraxia and dysphasia and dysphonia.  He is able to bear weight and take some steps with assistance.  He requires at least 1 person assistance for transfers.  There are no reports of emesis, diarrhea, uncontrolled pain, vomiting, respiratory distress.  The patient has been seen by [facility] neurology at [facility] in the past as an outpatient.  The patient has documented dementia and Parkinsonism with corticobasal degeneration per PET scan  on 08/15/2020.  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  The patient has had difficulty with repetitive movements.  Memantine was discontinued secondary to dizziness. Previously tried on Carbidopa -Levodopa  without benefit, caused cognitive issues referral to the movement disorder clinic was declined per previous documentation.  In the ED, the patient was afebrile and hemodynamically stable with oxygen saturation 96% room air.  WBC 5.8, hemoglobin 11.9, platelets 227.  Sodium 138, potassium 3.7, bicarbonate 24, serum creatinine 1.10.  AST 42, ALT 44, alk phos 357, total bilirubin 0.7.  Albumin 3.6, calcium  9.1.  INR 1.0.  CT of the brain was negative for acute findings.  MRI of the brain was negative for acute findings.  Chest x-ray was negative for any infiltrates or edema.  Neurology was consulted and recommended admission for workup of seizure and possible stroke.  Review of Systems: As mentioned in the history of present illness. All other systems reviewed and are negative. Past Medical History:  Diagnosis Date   Carpal tunnel syndrome on both sides    Dementia (HCC)    diagnosed in jan 2019, pt states medication seems to be helping, A&Ox4   HLD (hyperlipidemia)    Hypertension    Memory disorder 11/24/2017   Myocardial infarction [facility])    Dr. Levern   Spinal stenosis of cervical region    Past Surgical History:  Procedure Laterality Date   ABDOMINAL SURGERY  CARDIAC CATHETERIZATION  2005   stent   CARPAL TUNNEL RELEASE Right    CARPAL TUNNEL RELEASE Right 03/09/2018   Procedure: RIGHT CARPAL TUNNEL RELEASE;  Surgeon: Murrell Kuba, MD;  Location: [facility];  Service: Orthopedics;  Laterality: Right;   CARPAL TUNNEL RELEASE Left  11/02/2018   Procedure: LEFT CARPAL TUNNEL RELEASE;  Surgeon: Murrell Kuba, MD;  Location: [facility];  Service: Orthopedics;  Laterality: Left;   TENDON TRANSFER Left 11/02/2018   Procedure: LEFT CAMITZ TRANSFER;  Surgeon: Murrell Kuba, MD;  Location: [facility];  Service: Orthopedics;  Laterality: Left;   TRIGGER FINGER RELEASE Right 03/09/2018   Procedure: RELEASE TRIGGER FINGER/A-1 PULLEY RIGHT MIDDLE, RING AND SMALL FINGERS;  Surgeon: Murrell Kuba, MD;  Location: [facility];  Service: Orthopedics;  Laterality: Right;   ULNAR NERVE TRANSPOSITION Left 11/02/2018   Procedure: LEFT ULNAR NERVE DECOMPRESSION;  Surgeon: Murrell Kuba, MD;  Location: [facility];  Service: Orthopedics;  Laterality: Left;   ULNAR TUNNEL RELEASE Right 03/09/2018   Procedure: CUBITAL TUNNEL DECOMPRESSION RIGHT ELBOW;  Surgeon: Murrell Kuba, MD;  Location: [facility];  Service: Orthopedics;  Laterality: Right;   Social History:  reports that he has quit smoking. He has been exposed to tobacco smoke. He has quit using smokeless tobacco. He reports that he does not currently use alcohol after a past usage of about 24.0 standard drinks of alcohol per week. He reports that he does not use drugs.  Allergies  Allergen Reactions   Penicillin G Anaphylaxis    History reviewed. No pertinent family history.  Prior to Admission medications   Medication Sig Start Date End Date Taking? Authorizing Provider  aspirin  EC 81 MG tablet Take 81 mg by mouth daily.   Yes [provider]  Chlorpheniramine Maleate (ALLERGY PO) Take 180 mg by mouth daily as needed (allergies).   Yes [provider]  Cholecalciferol (QC VITAMIN D3) 25 MCG (1000 UT) tablet Take 1,000 Units by mouth daily.   Yes [provider]  donepezil  (ARICEPT ) 10 MG tablet TAKE 1 TABLET BY MOUTH EVERYDAY AT BEDTIME 11/24/23  Yes Duanne Butler DASEN, MD  losartan (COZAAR) 100 MG tablet  Take 100 mg by mouth daily. 11/08/21  Yes [provider]  Psyllium (METAMUCIL PO) Take 1 tablet by mouth daily.   Yes [provider]    Physical Exam: Vitals:   05/04/24 0900 05/04/24 1000 05/04/24 1100 05/04/24 1200  BP: 127/79 133/82 (!) 152/93 136/83  Pulse: 66 67 60 (!) 58  Resp: 14 16 13 13   SpO2: 95% 98% 96% 96%  Weight:      Height:       GENERAL:  A&O x 1, NAD, well developed, cooperative, intermittently follows commands HEENT: [place]/AT, No thrush, No icterus, No oral ulcers Neck:  No neck mass, No meningismus, soft, supple CV: RRR, no S3, no S4, no rub, no JVD Lungs:  CTA, no wheeze, no rhonchi, good air movement Abd: soft/NT +BS, nondistended Ext: No edema, no lymphangitis, no cyanosis, no rashes Neuro:  CN II-XII intact, strength 4-/5 in RUE, RLE, strength 4/5 LUE, LLE; sensation intact bilateral; no dysmetria; babinski equivocal  Data Reviewed: Data reviewed above in history  Assessment and Plan: Seizure-like activity -EEG - UA negative for pyuria - MRI brain negative for acute findings  Acute metabolic encephalopathy - Patient was somnolent at the time of presentation not following commands - At baseline, the patient does  have apraxia but is able to follow commands - Concern for postictal state - UA negative for pyuria - B12 - TSH - Folic acid  - MRI brain negative for acute findings  Major neurocognitive disorder - Continue Aricept   Parkinsonism - At baseline, patient has bradykinesia and cogwheel rigidity - Poor tolerance to Sinemet   Essential hypertension - Holding losartan temporarily  Coronary artery disease - Continue aspirin     Advance Care Planning: FULL  Consults: neurology  Family Communication: son updated 7/9  Severity of Illness: The appropriate patient status for this patient is OBSERVATION. Observation status is judged to be reasonable and necessary in order to provide the required intensity of service to ensure  the patient's safety. The patient's presenting symptoms, physical exam findings, and initial radiographic and laboratory data in the context of their medical condition is felt to place them at decreased risk for further clinical deterioration. Furthermore, it is anticipated that the patient will be medically stable for discharge from the hospital within 2 midnights of admission.   Author: Alm Schneider, MD 05/04/2024 12:51 PM  For on call review [email or web address].

## 2024-05-05 ENCOUNTER — Observation Stay (HOSPITAL_COMMUNITY)

## 2024-05-05 ENCOUNTER — Other Ambulatory Visit (HOSPITAL_COMMUNITY): Payer: Self-pay | Admitting: *Deleted

## 2024-05-05 DIAGNOSIS — I10 Essential (primary) hypertension: Secondary | ICD-10-CM | POA: Diagnosis not present

## 2024-05-05 DIAGNOSIS — G9341 Metabolic encephalopathy: Secondary | ICD-10-CM | POA: Diagnosis not present

## 2024-05-05 DIAGNOSIS — G253 Myoclonus: Secondary | ICD-10-CM

## 2024-05-05 DIAGNOSIS — R569 Unspecified convulsions: Secondary | ICD-10-CM | POA: Diagnosis not present

## 2024-05-05 DIAGNOSIS — F0390 Unspecified dementia without behavioral disturbance: Secondary | ICD-10-CM | POA: Diagnosis not present

## 2024-05-05 DIAGNOSIS — G20C Parkinsonism, unspecified: Secondary | ICD-10-CM | POA: Diagnosis not present

## 2024-05-05 LAB — LIPID PANEL
Cholesterol: 167 mg/dL (ref 0–200)
HDL: 48 mg/dL (ref >40)
LDL Cholesterol: 102 mg/dL — ABNORMAL HIGH (ref 0–99)
Total CHOL/HDL Ratio: 3.5 ratio
Triglycerides: 86 mg/dL (ref <150)
VLDL: 17 mg/dL (ref 0–40)

## 2024-05-05 LAB — BASIC METABOLIC PANEL WITH GFR
Anion gap: 8 (ref 5–15)
BUN: 12 mg/dL (ref 8–23)
CO2: 29 mmol/L (ref 22–32)
Calcium: 9.1 mg/dL (ref 8.9–10.3)
Chloride: 104 mmol/L (ref 98–111)
Creatinine, Ser: 0.87 mg/dL (ref 0.61–1.24)
GFR, Estimated: >60 mL/min (ref >60)
Glucose, Bld: 82 mg/dL (ref 70–99)
Potassium: 3.8 mmol/L (ref 3.5–5.1)
Sodium: 141 mmol/L (ref 135–145)

## 2024-05-05 LAB — MAGNESIUM: Magnesium: 2.2 mg/dL (ref 1.7–2.4)

## 2024-05-05 LAB — ECHOCARDIOGRAM COMPLETE
Area-P 1/2: 2.95 cm2
Height: 68 in
S' Lateral: 2.7 cm
Weight: 2151.69 [oz_av]

## 2024-05-05 LAB — HEMOGLOBIN A1C
Hgb A1c MFr Bld: 4.9 % (ref 4.8–5.6)
Mean Plasma Glucose: 94 mg/dL

## 2024-05-05 LAB — AMMONIA: Ammonia: 13 umol/L (ref 9–35)

## 2024-05-05 MED ORDER — FOLIC ACID 1 MG PO TABS
1.0000 mg | ORAL_TABLET | Freq: Every day | ORAL | Status: DC
  Administered 2024-05-05: 1 mg via ORAL
  Filled 2024-05-05: qty 1

## 2024-05-05 MED ORDER — VITAMIN B-12 100 MCG PO TABS
500.0000 ug | ORAL_TABLET | Freq: Every day | ORAL | Status: DC
  Administered 2024-05-05: 500 ug via ORAL
  Filled 2024-05-05: qty 5

## 2024-05-05 MED ORDER — CYANOCOBALAMIN 500 MCG PO TABS: 500.0000 ug | ORAL_TABLET | Freq: Every day | ORAL | Status: DC

## 2024-05-05 MED ORDER — FOLIC ACID 1 MG PO TABS: 1.0000 mg | ORAL_TABLET | Freq: Every day | ORAL | Status: DC

## 2024-05-05 MED ORDER — PERFLUTREN LIPID MICROSPHERE
1.0000 mL | INTRAVENOUS | Status: DC | PRN
  Administered 2024-05-05: 3 mL via INTRAVENOUS

## 2024-05-05 NOTE — Evaluation (Signed)
 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 Levern   Spinal stenosis of cervical region    Past Surgical History:  Past Surgical History:  Procedure Laterality Date   ABDOMINAL SURGERY     CARDIAC CATHETERIZATION  2005   stent   CARPAL TUNNEL RELEASE Right    CARPAL TUNNEL RELEASE Right 03/09/2018   Procedure: RIGHT CARPAL TUNNEL RELEASE;  Surgeon: Murrell Arley, MD;  Location: [facility];  Service: Orthopedics;  Laterality: Right;   CARPAL TUNNEL RELEASE Left 11/02/2018   Procedure: LEFT CARPAL TUNNEL RELEASE;  Surgeon: Murrell Arley, MD;  Location: [facility];  Service: Orthopedics;  Laterality: Left;   TENDON TRANSFER Left 11/02/2018   Procedure: LEFT CAMITZ TRANSFER;  Surgeon: Murrell Arley, MD;  Location: [facility];  Service: Orthopedics;  Laterality: Left;   TRIGGER FINGER RELEASE Right 03/09/2018   Procedure: RELEASE TRIGGER FINGER/A-1 PULLEY RIGHT MIDDLE, RING AND SMALL FINGERS;  Surgeon: Murrell Arley, MD;  Location: [facility];  Service: Orthopedics;  Laterality: Right;   ULNAR NERVE TRANSPOSITION Left 11/02/2018   Procedure: LEFT ULNAR NERVE DECOMPRESSION;  Surgeon: Murrell Arley, MD;  Location: [facility];  Service: Orthopedics;  Laterality: Left;   ULNAR TUNNEL RELEASE Right 03/09/2018   Procedure: CUBITAL TUNNEL DECOMPRESSION RIGHT ELBOW;  Surgeon: Murrell Arley, MD;  Location: [facility];  Service: Orthopedics;  Laterality: Right;   HPI:  73 year old male with a history of parkinsonism, dementia, cortical basilar degeneration, hypertension, coronary disease, hyperlipidemia presenting with seizure-like activity. The patient currently resides at [facility] (x 1 week). The patient woke up in his usual state of health around 6:30 AM on 05/04/2024.  Around 7:40 AM, the patient was at breakfast when he developed tonic-clonic activity. 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 BSE/SLE requested.   Assessment / Plan / Recommendation Clinical Impression  Pt presents with moderate cognitive communication deficits characterized by mixed dysarthria with reduced vocal intensity, breathiness, imprecise articulation for longer utterances, impaired auditory comprehension for 2-step directions and lengthier conversation, impacted by impaired attention and working memory. Pt able to participate in short conversations regarding personally relevant information. He has difficulty answering open ended questions and benefits from binary choices and yes/no questions. Pt smiled and laughed appropriately at times and sang along to [facility] when played for him. Recommend f/u SLP services at his care home for home health SLP as he needs change due to his diagnosis. No further acute SLP needs at this time.  SLP Assessment  SLP Recommendation/Assessment: All further Speech Language Pathology needs can be addressed in the next venue of  care SLP Visit Diagnosis: Dysarthria and anarthria (R47.1);Cognitive communication deficit (R41.841)     Assistance Recommended at Discharge     Functional Status Assessment Patient has not had a recent decline in their functional status  Frequency and Duration           SLP Evaluation Cognition  Overall Cognitive Status: History of cognitive impairments - at baseline Arousal/Alertness: Awake/alert Orientation Level: Oriented to person;Disoriented to place;Disoriented to time;Disoriented to situation Year:  (unable) Day of Week: Incorrect Memory: Impaired Memory Impairment: Storage deficit Awareness: Impaired Awareness Impairment: Emergent impairment Problem Solving: Impaired Problem Solving Impairment: Verbal basic;Functional basic Behaviors: Restless;Impulsive Safety/Judgment: Impaired       Comprehension  Auditory Comprehension Overall Auditory Comprehension: Impaired Yes/No Questions: Impaired Basic Biographical Questions: 76-100% accurate Complex Questions: 0-24% accurate Commands: Impaired One Step Basic Commands: 50-74% accurate Two Step Basic Commands: 0-24% accurate Conversation: Simple Interfering Components: Working [employer] fast streamer;Attention EffectiveTechniques: Extra processing time;Repetition;Visual/Gestural cues Visual Recognition/Discrimination Discrimination: Not tested Reading Comprehension Reading Status: Not tested    Expression Expression Primary Mode of Expression: Verbal Verbal Expression Overall Verbal Expression: Impaired Initiation: Impaired Automatic Speech: Name;Social Response;Counting Level of Generative/Spontaneous Verbalization: Phrase Repetition: Impaired Level of Impairment: Sentence level Naming: Impairment Responsive: 51-75% accurate Confrontation: Impaired Convergent: 50-74% accurate Divergent: Not tested Pragmatics: No impairment Interfering Components: Speech intelligibility Effective Techniques: Sentence completion;Phonemic  cues Non-Verbal Means of Communication: Not applicable Written Expression Dominant Hand: Right Written Expression: Not tested   Oral / Motor  Oral Motor/Sensory Function Overall Oral Motor/Sensory Function: Other (comment) (reduced coordination, some lingual dystonia) Motor Speech Overall Motor Speech: Impaired Respiration: Impaired Level of Impairment: Sentence Phonation: Breathy;Low vocal intensity Resonance: Within functional limits Articulation: Impaired Level of Impairment: Phrase Intelligibility: Intelligibility reduced Word: 75-100% accurate Sentence: 50-74% accurate Motor Planning: Within functional limits Effective Techniques: Increased vocal intensity           Thank you,  Jay Rivas, CCC-SLP [phone]  Jay Rivas 05/05/2024, 4:06 PM

## 2024-05-05 NOTE — TOC Transition Note (Signed)
 Transition of Care [facility]) - Discharge Note   Patient Details  Name: Jay Rivas MRN: [id number] Date of Birth: Sep 30, 1951  [facility] of [facility] CM/SW Contact:  Lucie Lunger, LCSWA Phone Number: 05/05/2024, 2:41 PM   Clinical Narrative:    CSW notes per chart review that pt arrived from [facility]. CSW spoke to Scanlon with [facility] who states pt can return and they will be able to provide transportation. Fl2 and D/C summary sent to [facility] email. CSW also spoke with pts son to review plan for return to [facility], he is agreeable and understanding of this. TOC signing off.   Final next level of care: Rest Home Barriers to Discharge: Barriers Resolved   Patient Goals and CMS Choice Patient states their goals for this hospitalization and ongoing recovery are:: return to [facility] CMS Medicare.gov Compare Post Acute Care list provided to:: Patient Represenative (must comment) Choice offered to / list presented to : Adult Children      Discharge Placement                Patient to be transferred to facility by: Facility staff Name of family member notified: son Jay Rivas Patient and family notified of of transfer: 05/05/24  Discharge Plan and Services Additional resources added to the After Visit Summary for                                       Social Drivers of Health (SDOH) Interventions SDOH Screenings   Food Insecurity: No Food Insecurity (05/04/2024)  Housing: Low Risk  (05/04/2024)  Transportation Needs: No Transportation Needs (05/04/2024)  Utilities: Not At Risk (05/04/2024)  Alcohol Screen: Low Risk  (12/21/2023)  Depression (PHQ2-9): Low Risk  (12/21/2023)  Financial Resource Strain: Low Risk  (12/21/2023)  Physical Activity: Sufficiently Active (12/21/2023)  Social Connections: Socially Integrated (05/04/2024)  Stress: No Stress Concern Present (12/21/2023)  Tobacco Use: Medium Risk (05/04/2024)  Health Literacy: Adequate Health Literacy (12/21/2023)      Readmission Risk Interventions     No data to display

## 2024-05-05 NOTE — Plan of Care (Signed)
   Problem: Education: Goal: Knowledge of General Education information will improve Description: Including pain rating scale, medication(s)/side effects and non-pharmacologic comfort measures Outcome: Progressing   Problem: Clinical Measurements: Goal: Ability to maintain clinical measurements within normal limits will improve Outcome: Progressing

## 2024-05-05 NOTE — Progress Notes (Signed)
*  PRELIMINARY RESULTS* Echocardiogram 2D Echocardiogram has been performed with [facility] .  Jay Rivas 05/05/2024, 4:58 PM

## 2024-05-05 NOTE — Care Management Obs Status (Signed)
 MEDICARE OBSERVATION STATUS NOTIFICATION   Patient Details  Name: Jay Rivas MRN: [id number] Date of Birth: 23-Apr-1951   Medicare Observation Status Notification Given:  Yes    Lucie Lunger, LCSWA 05/05/2024, 12:10 PM

## 2024-05-05 NOTE — Evaluation (Signed)
 Physical Therapy Evaluation Patient Details Name: Jay Rivas MRN: [id number] DOB: 10-07-1951 Today's Date: 05/05/2024  History of Present Illness  Jay Rivas is a 73 year old male with a history of parkinsonism, dementia, cortical basilar degeneration, hypertension, coronary disease, hyperlipidemia presenting with seizure-like activity.  History is supplemented by review of the medical record and speaking with the patient's family at the bedside.  The patient currently resides at [facility].  The patient woke up in his usual state of health around 6:30 AM on 05/04/2024.  Around 7:40 AM, the patient was at breakfast when he developed tonic-clonic activity.  He is unclear of the duration.  Apparently the patient would not spit his food out and had the staff manually remove fluid from his mouth.  Apparently the patient was somnolent after the episode.  He remained somnolent upon arrival to the emergency department.  EMS was activated and code stroke was initiated prior to arrival.  Ativan  1 mg was given at 0830.  Since then, the patient has had improving mental status.  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  Son notes that his usual myoclonus has actually improved after being given Ativan .  The patient has not been on any new medications.  At baseline, the patient has apraxia and dysphasia and dysphonia.  He is able to bear weight and take some steps with assistance.  He requires at least 1 person assistance for transfers.  There are no reports of emesis, diarrhea, uncontrolled pain, vomiting, respiratory distress.   Clinical Impression  Patient demonstrates slow labored movement for sitting up at bedside with severe shaking of extremities, very unsteady on feet with ataxic like movement due to tremors in legs and unable to safely take forward steps. Patient tolerated sitting up in chair after therapy. Patient will benefit from continued  skilled physical therapy in hospital and recommended venue below to increase strength, balance, endurance for safe ADLs and gait.         If plan is discharge home, recommend the following: A lot of help with bathing/dressing/bathroom;A lot of help with walking and/or transfers;Help with stairs or ramp for entrance;Assistance with cooking/housework   Can travel by private vehicle   No    Equipment Recommendations None recommended by PT  Recommendations for Other Services       Functional Status Assessment Patient has had a recent decline in their functional status and demonstrates the ability to make significant improvements in function in a reasonable and predictable amount of time.     Precautions / Restrictions Precautions Precautions: Fall Recall of Precautions/Restrictions: Impaired Restrictions Weight Bearing Restrictions Per Provider Order: No      Mobility  Bed Mobility Overal bed mobility: Needs Assistance Bed Mobility: Supine to Sit     Supine to sit: Mod assist     General bed mobility comments: labored movement with increased time    Transfers Overall transfer level: Needs assistance Equipment used: Rolling walker (2 wheels) Transfers: Bed to chair/wheelchair/BSC, Sit to/from Stand Sit to Stand: Mod assist, Max assist   Step pivot transfers: Mod assist, Max assist       General transfer comment: unsteady shaky movement of extremities, at high risk for falls    Ambulation/Gait Ambulation/Gait assistance: Mod assist, Max assist Gait Distance (Feet): 5 Feet Assistive device: Rolling walker (2 wheels) Gait Pattern/deviations: Decreased step length - left, Decreased step length - right, Decreased stride length, Ataxic, Wide base of support Gait velocity: slow  General Gait Details: slow labored movement with most diffiuclty taking steps forward due to shaking of legs, arms  Stairs            Wheelchair Mobility     Tilt Bed     Modified Rankin (Stroke Patients Only)       Balance Overall balance assessment: Needs assistance Sitting-balance support: Feet supported, No upper extremity supported Sitting balance-Leahy Scale: Fair Sitting balance - Comments: seated at EOB   Standing balance support: Bilateral upper extremity supported, During functional activity, Reliant on assistive device for balance Standing balance-Leahy Scale: Poor Standing balance comment: using RW                             Pertinent Vitals/Pain Pain Assessment Pain Assessment: No/denies pain    Home Living Family/patient expects to be discharged to:: Assisted living                 Home Equipment: Rolling Walker (2 wheels) Additional Comments: Pt is a poor historian.    Prior Function Prior Level of Function : Patient poor historian/Family not available             Mobility Comments: Supervised household ambulation using RW ADLs Comments: Pt reported independence with bathing and dressing, but no family present to confirm this.     Extremity/Trunk Assessment   Upper Extremity Assessment Upper Extremity Assessment: Defer to OT evaluation    Lower Extremity Assessment Lower Extremity Assessment: Generalized weakness    Cervical / Trunk Assessment Cervical / Trunk Assessment: Normal  Communication   Communication Communication: No apparent difficulties    Cognition Arousal: Alert Behavior During Therapy: WFL for tasks assessed/performed   PT - Cognitive impairments: History of cognitive impairments                         Following commands: Impaired Following commands impaired: Follows one step commands inconsistently     Cueing Cueing Techniques: Verbal cues     General Comments      Exercises     Assessment/Plan    PT Assessment Patient needs continued PT services  PT Problem List Decreased strength;Decreased activity tolerance;Decreased balance;Decreased  mobility       PT Treatment Interventions DME instruction;Gait training;Stair training;Functional mobility training;Therapeutic activities;Therapeutic exercise;Balance training;Patient/family education    PT Goals (Current goals can be found in the Care Plan section)  Acute Rehab PT Goals Patient Stated Goal: return home PT Goal Formulation: With patient Time For Goal Achievement: 05/19/24 Potential to Achieve Goals: Good    Frequency Min 3X/week     Co-evaluation PT/OT/SLP Co-Evaluation/Treatment: Yes Reason for Co-Treatment: To address functional/ADL transfers PT goals addressed during session: Mobility/safety with mobility;Balance;Proper use of DME OT goals addressed during session: ADL's and self-care       AM-PAC PT 6 Clicks Mobility  Outcome Measure Help needed turning from your back to your side while in a flat bed without using bedrails?: A Lot Help needed moving from lying on your back to sitting on the side of a flat bed without using bedrails?: A Lot Help needed moving to and from a bed to a chair (including a wheelchair)?: A Lot Help needed standing up from a chair using your arms (e.g., wheelchair or bedside chair)?: A Lot Help needed to walk in hospital room?: A Lot Help needed climbing 3-5 steps with a railing? : Total 6 Click Score: 11  End of Session   Activity Tolerance: Patient tolerated treatment well;Patient limited by fatigue Patient left: in chair;with call bell/phone within reach;with chair alarm set Nurse Communication: Mobility status PT Visit Diagnosis: Unsteadiness on feet (R26.81);Other abnormalities of gait and mobility (R26.89);Muscle weakness (generalized) (M62.81)    Time: 9196-9174 PT Time Calculation (min) (ACUTE ONLY): 22 min   Charges:   PT Evaluation $PT Eval Moderate Complexity: 1 Mod PT Treatments $Therapeutic Activity: 8-22 mins PT General Charges $$ ACUTE PT VISIT: 1 Visit         12:30 PM, 05/05/24 Lynwood Music, MPT Physical Therapist with [facility] [phone] office [phone] mobile phone

## 2024-05-05 NOTE — Evaluation (Signed)
 Occupational Therapy Evaluation Patient Details Name: Jay Rivas MRN: [id number] DOB: 1950-12-04 Today's Date: 05/05/2024   History of Present Illness   Jay Rivas is a 73 year old male with a history of parkinsonism, dementia, cortical basilar degeneration, hypertension, coronary disease, hyperlipidemia presenting with seizure-like activity.  History is supplemented by review of the medical record and speaking with the patient's family at the bedside.  The patient currently resides at [facility].  The patient woke up in his usual state of health around 6:30 AM on 05/04/2024.  Around 7:40 AM, the patient was at breakfast when he developed tonic-clonic activity.  He is unclear of the duration.  Apparently the patient would not spit his food out and had the staff manually remove fluid from his mouth.  Apparently the patient was somnolent after the episode.  He remained somnolent upon arrival to the emergency department.  EMS was activated and code stroke was initiated prior to arrival.  Ativan  1 mg was given at 0830.  Since then, the patient has had improving mental status.  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  Son notes that his usual myoclonus has actually improved after being given Ativan .  The patient has not been on any new medications.  At baseline, the patient has apraxia and dysphasia and dysphonia.  He is able to bear weight and take some steps with assistance.  He requires at least 1 person assistance for transfers.  There are no reports of emesis, diarrhea, uncontrolled pain, vomiting, respiratory distress. (per MD)     Clinical Impressions Pt agreeable to OT and PT co-evaluation. Pt likely assisted at baseline but no family present to confirm baseline status. Mod to max A for transfer to chair with RW. Mod A for bed mobility as well. Difficult to assess B UE strength given cognitive deficits and ataxic movements. Mod to  total assist likely for all ADL's. Pt left in the chair with the chair alarm set and call bell within reach. Pt will benefit from continued OT in the hospital and recommended venue below to increase strength, balance, and endurance for safe ADL's.        If plan is discharge home, recommend the following:   A lot of help with walking and/or transfers;A lot of help with bathing/dressing/bathroom;Assistance with cooking/housework;Assistance with feeding;Assist for transportation;Direct supervision/assist for medications management;Help with stairs or ramp for entrance;Supervision due to cognitive status     Functional Status Assessment   Patient has had a recent decline in their functional status and demonstrates the ability to make significant improvements in function in a reasonable and predictable amount of time.     Equipment Recommendations   None recommended by OT             Precautions/Restrictions   Precautions Precautions: Fall Recall of Precautions/Restrictions: Impaired Restrictions Weight Bearing Restrictions Per Provider Order: No     Mobility Bed Mobility Overal bed mobility: Needs Assistance Bed Mobility: Supine to Sit     Supine to sit: Mod assist     General bed mobility comments: Assit to pull to sit; apraxic.    Transfers Overall transfer level: Needs assistance Equipment used: Rolling walker (2 wheels) Transfers: Bed to chair/wheelchair/BSC, Sit to/from Stand Sit to Stand: Mod assist, Max assist     Step pivot transfers: Mod assist, Max assist     General transfer comment: unsteady on feet; short, choppy steps.      Balance Overall balance  assessment: Needs assistance Sitting-balance support: No upper extremity supported, Feet supported Sitting balance-Leahy Scale: Fair Sitting balance - Comments: seated at EOB   Standing balance support: Bilateral upper extremity supported, During functional activity, Reliant on assistive  device for balance Standing balance-Leahy Scale: Poor Standing balance comment: using RW                           ADL either performed or assessed with clinical judgement   ADL Overall ADL's : Needs assistance/impaired     Grooming: Moderate assistance;Maximal assistance;Sitting   Upper Body Bathing: Moderate assistance;Maximal assistance;Sitting   Lower Body Bathing: Maximal assistance;Total assistance;Sitting/lateral leans   Upper Body Dressing : Maximal assistance;Moderate assistance;Sitting   Lower Body Dressing: Maximal assistance;Total assistance;Sitting/lateral leans   Toilet Transfer: Moderate assistance;Maximal assistance;Stand-pivot;Rolling walker (2 wheels) Toilet Transfer Details (indicate cue type and reason): Simulated via EOB to chair. Toileting- Clothing Manipulation and Hygiene: Maximal assistance;Total assistance               Vision Baseline Vision/History: 0 No visual deficits Ability to See in Adequate Light: 0 Adequate Patient Visual Report: No change from baseline Vision Assessment?: No apparent visual deficits     Perception Perception: Not tested       Praxis Praxis: Impaired Praxis Impairment Details: Organization, Motor planning     Pertinent Vitals/Pain Pain Assessment Pain Assessment: No/denies pain     Extremity/Trunk Assessment Upper Extremity Assessment Upper Extremity Assessment: Difficult to assess due to impaired cognition (2+/5 R shoulder flexion; 3-/5 L shoulder flexion. Difficult to assess. Very apraxic.)   Lower Extremity Assessment Lower Extremity Assessment: Defer to PT evaluation   Cervical / Trunk Assessment Cervical / Trunk Assessment: Normal   Communication Communication Communication: No apparent difficulties   Cognition Arousal: Alert Behavior During Therapy: WFL for tasks assessed/performed Cognition: History of cognitive impairments             OT - Cognition Comments: Baseline dementia.  Not oriented to person or place.                 Following commands: Impaired Following commands impaired: Follows one step commands inconsistently     Cueing  General Comments   Cueing Techniques: Verbal cues                 Home Living Family/patient expects to be discharged to:: Assisted living                             Home Equipment: Rolling Walker (2 wheels)   Additional Comments: Pt is a poor historian.      Prior Functioning/Environment Prior Level of Function : Patient poor historian/Family not available             Mobility Comments: Pt reported using RW. ADLs Comments: Pt reported independence with bathing and dressing, but no family present to confirm this.    OT Problem List: Decreased strength;Decreased range of motion;Decreased activity tolerance;Impaired balance (sitting and/or standing);Decreased cognition;Decreased coordination;Decreased knowledge of use of DME or AE;Decreased knowledge of precautions;Impaired UE functional use   OT Treatment/Interventions: Self-care/ADL training;Therapeutic exercise;Neuromuscular education;DME and/or AE instruction;Therapeutic activities;Cognitive remediation/compensation;Patient/family education;Balance training      OT Goals(Current goals can be found in the care plan section)   Acute Rehab OT Goals Patient Stated Goal: none stated OT Goal Formulation: Patient unable to participate in goal setting Time For Goal Achievement: 05/19/24 Potential to  Achieve Goals: Fair   OT Frequency:  Min 2X/week    Co-evaluation PT/OT/SLP Co-Evaluation/Treatment: Yes Reason for Co-Treatment: To address functional/ADL transfers   OT goals addressed during session: ADL's and self-care      AM-PAC OT 6 Clicks Daily Activity     Outcome Measure Help from another person eating meals?: A Lot Help from another person taking care of personal grooming?: A Lot Help from another person toileting, which  includes using toliet, bedpan, or urinal?: A Lot Help from another person bathing (including washing, rinsing, drying)?: A Lot Help from another person to put on and taking off regular upper body clothing?: A Lot Help from another person to put on and taking off regular lower body clothing?: A Lot 6 Click Score: 12   End of Session Equipment Utilized During Treatment: Rolling walker (2 wheels);Gait belt  Activity Tolerance: Patient tolerated treatment well Patient left: in chair;with call bell/phone within reach;with chair alarm set  OT Visit Diagnosis: Unsteadiness on feet (R26.81);Other abnormalities of gait and mobility (R26.89);Muscle weakness (generalized) (M62.81);Ataxia, unspecified (R27.0)                Time: 9183-9169 OT Time Calculation (min): 14 min Charges:  OT General Charges $OT Visit: 1 Visit OT Evaluation $OT Eval Low Complexity: 1 Low  Richa Shor OT, MOT  [name] 05/05/2024, 9:21 AM

## 2024-05-05 NOTE — NC FL2 (Addendum)
 [place]  MEDICAID [id number] LEVEL OF CARE FORM     IDENTIFICATION  Patient Name: Jay Rivas Birthdate: 1951-07-09 Sex: male Admission Date (Current Location): 05/04/2024  [facility] and [place] Number:  [employer] and Address:  [facility],  [address], [place] [address]      Provider Number: [id number]  Attending Physician Name and Address:  Evonnie Lenis, MD  Relative Name and Phone Number:       Current Level of Care: Hospital Recommended Level of Care: Family Care Home Prior Approval Number:    Date Approved/Denied:   PASRR Number:    Discharge Plan: Other (Comment) [facility])    Current Diagnoses: Patient Active Problem List   Diagnosis Date Noted   Seizure-like activity (HCC) 05/04/2024   Acute metabolic encephalopathy 05/04/2024   Parkinsonism (HCC) 05/04/2024   Major neurocognitive disorder (HCC) 05/04/2024   Iron deficiency anemia, unspecified 11/14/2022   Carpal tunnel syndrome 11/14/2022   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   HLD (hyperlipidemia)    Abnormal gait 02/24/2018   Memory disorder 11/24/2017   Trigger ring finger of right hand 10/14/2017   Hypertension 09/06/2014    Orientation RESPIRATION BLADDER Height & Weight     Self, Place  Normal Incontinent Weight: 134 lb 7.7 oz (61 kg) Height:  5' 8 (172.7 cm)  BEHAVIORAL SYMPTOMS/MOOD NEUROLOGICAL BOWEL NUTRITION STATUS      Continent Diet (Regular)  AMBULATORY STATUS COMMUNICATION OF NEEDS Skin   Supervision Verbally Normal                       Personal Care Assistance Level of Assistance  Bathing, Feeding, Dressing Bathing Assistance: Maximum assistance Feeding assistance: Limited assistance Dressing Assistance: Maximum assistance     Functional Limitations Info  Sight, Hearing, Speech Sight Info: Impaired Hearing Info: Adequate Speech Info: Adequate    SPECIAL CARE FACTORS FREQUENCY   (Home Health PT/OT/Speech Services with [facility])                     Contractures Contractures Info: Not present    Additional Factors Info  Code Status, Allergies Code Status Info: FULL Allergies Info: Penicillin G           Current Medications (05/05/2024):  This is the current hospital active medication list Current Facility-Administered Medications  Medication Dose Route Frequency Provider Last Rate Last Admin    stroke: early stages of recovery book   Does not apply Once Tat, Lenis, MD       0.9 %  sodium chloride  infusion   Intravenous Continuous Tat, David, MD 50 mL/hr at 05/05/24 0508 Infusion Verify at 05/05/24 9491   acetaminophen  (TYLENOL ) tablet 650 mg  650 mg Oral Q4H PRN Tat, Lenis, MD       Or   acetaminophen  (TYLENOL ) 160 MG/5ML solution 650 mg  650 mg Per Tube Q4H PRN Tat, Lenis, MD       Or   acetaminophen  (TYLENOL ) suppository 650 mg  650 mg Rectal Q4H PRN Tat, Lenis, MD       aspirin  EC tablet 81 mg  81 mg Oral Daily Tat, David, MD   81 mg at 05/05/24 1059   donepezil  (ARICEPT ) tablet 10 mg  10 mg Oral QHS Evonnie Lenis, MD   10 mg at 05/04/24 2001   enoxaparin  (LOVENOX ) injection 40 mg  40 mg Subcutaneous Q24H Evonnie Lenis, MD   40 mg at 05/04/24 2001   folic acid  (FOLVITE ) tablet 1  mg  1 mg Oral Daily Tat, David, MD       senna-docusate (Senokot-S) tablet 1 tablet  1 tablet Oral QHS PRN Tat, Alm, MD       vitamin B-12 (CYANOCOBALAMIN ) tablet 500 mcg  500 mcg Oral Daily Tat, Alm, MD         Discharge Medications: Allergies as of 05/05/2024       Reactions   Penicillin G Anaphylaxis        Medication List     STOP taking these medications    ALLERGY PO       TAKE these medications    aspirin  EC 81 MG tablet Take 81 mg by mouth daily.   cyanocobalamin  500 MCG tablet Commonly known as: VITAMIN B12 Take 1 tablet (500 mcg total) by mouth daily.   donepezil  10 MG tablet Commonly known as: ARICEPT  TAKE 1 TABLET BY MOUTH EVERYDAY AT BEDTIME   folic acid  1 MG tablet Commonly known as: FOLVITE  Take 1 tablet  (1 mg total) by mouth daily.   losartan 100 MG tablet Commonly known as: COZAAR Take 100 mg by mouth daily.   METAMUCIL PO Take 1 tablet by mouth daily.   QC Vitamin D3 25 MCG (1000 UT) tablet Generic drug: Cholecalciferol Take 1,000 Units by mouth daily.         Relevant Imaging Results:  Relevant Lab Results:   Additional Information SSN: [id number], [place]

## 2024-05-05 NOTE — Discharge Summary (Addendum)
 Physician Discharge Summary   Patient: Jay Rivas MRN: [id number] DOB: August 12, 1951  Admit date:     05/04/2024  Discharge date: 05/05/24  Discharge Physician: Alm Schyler Butikofer   PCP: Duanne Butler DASEN, MD   Recommendations at discharge:   Please follow up with primary care provider within 1-2 weeks  Please repeat BMP and CBC in one week     Hospital Course: 73 year old male with a history of parkinsonism, dementia, cortical basilar degeneration, hypertension, coronary disease, hyperlipidemia presenting with seizure-like activity.  History is supplemented by review of the medical record and speaking with the patient's family at the bedside.  The patient currently resides at [facility].  The patient woke up in his usual state of health around 6:30 AM on 05/04/2024.  Around 7:40 AM, the patient was at breakfast when he developed tonic-clonic activity.  He is unclear of the duration.  Apparently the patient would not spit his food out and had the staff manually remove fluid from his mouth.  Apparently the patient was somnolent after the episode.  He remained somnolent upon arrival to the emergency department.  EMS was activated and code stroke was initiated prior to arrival.  Ativan  1 mg was given at 0830.  Since then, the patient has had improving mental status.  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  Son notes that his usual myoclonus has actually improved after being given Ativan .  The patient has not been on any new medications. At baseline, the patient has apraxia and dysphasia and dysphonia.  He is able to bear weight and take some steps with assistance.  He requires at least 1 person assistance for transfers.  There are no reports of emesis, diarrhea, uncontrolled pain, vomiting, respiratory distress.  The patient has been seen by [facility] neurology at [facility] in the past as an outpatient.  The patient has documented dementia and  Parkinsonism with corticobasal degeneration per PET scan on 08/15/2020.  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  The patient has had difficulty with repetitive movements.  Memantine was discontinued secondary to dizziness. Previously tried on Carbidopa -Levodopa  without benefit, caused cognitive issues referral to the movement disorder clinic was declined per previous documentation.  In the ED, the patient was afebrile and hemodynamically stable with oxygen saturation 96% room air.  WBC 5.8, hemoglobin 11.9, platelets 227.  Sodium 138, potassium 3.7, bicarbonate 24, serum creatinine 1.10.  AST 42, ALT 44, alk phos 357, total bilirubin 0.7.  Albumin 3.6, calcium  9.1.  INR 1.0.  CT of the brain was negative for acute findings.  MRI of the brain was negative for acute findings.  Chest x-ray was negative for any infiltrates or edema.  Neurology was consulted and recommended admission for workup of seizure and possible stroke.  Assessment and Plan: Seizure-like activity -EEG--neg for seizure - UA negative for pyuria - MRI brain negative for acute findings - appreciate neurology consult>>would not start AEDs at this time; follow up with [facility] - Echo--EF 60-65%, G1DD, normal RVF, no PFO, trivial MR   Acute metabolic encephalopathy - Patient was somnolent at the time of presentation not following commands - At baseline, the patient does have apraxia but is able to follow commands - Concern for postictal state - UA negative for pyuria - B12--287>>would  like this >400; family agrees with supplementation - TSH--6.474; Free T4--0.69 - Folic acid --8.4>>would like this over 10; family agrees with supplementation - MRI brain negative for acute findings -  05/05/24--mental status back to baseline   Major neurocognitive disorder - Continue Aricept  - follow up with [facility]   Parkinsonism - At baseline, patient has bradykinesia and cogwheel rigidity - Poor tolerance to Sinemet  - follow up [facility] neurology   Essential hypertension - Holding losartan temporarily>>restart after d/c   Coronary artery disease - Continue aspirin       Consultants: neurology Procedures performed: none  Disposition: Assisted living Diet recommendation:  Cardiac diet DISCHARGE MEDICATION: Allergies as of 05/05/2024       Reactions   Penicillin G Anaphylaxis        Medication List     STOP taking these medications    ALLERGY PO       TAKE these medications    aspirin  EC 81 MG tablet Take 81 mg by mouth daily.   cyanocobalamin  500 MCG tablet Commonly known as: VITAMIN B12 Take 1 tablet (500 mcg total) by mouth daily.   donepezil  10 MG tablet Commonly known as: ARICEPT  TAKE 1 TABLET BY MOUTH EVERYDAY AT BEDTIME   folic acid  1 MG tablet Commonly known as: FOLVITE  Take 1 tablet (1 mg total) by mouth daily.   losartan 100 MG tablet Commonly known as: COZAAR Take 100 mg by mouth daily.   METAMUCIL PO Take 1 tablet by mouth daily.   QC Vitamin D3 25 MCG (1000 UT) tablet Generic drug: Cholecalciferol Take 1,000 Units by mouth daily.        Discharge Exam: Filed Weights   05/04/24 0854 05/04/24 1800  Weight: 63.5 kg 61 kg   HEENT:  [place]/AT, No thrush, no icterus CV:  RRR, no rub, no S3, no S4 Lung:  CTA, no wheeze, no rhonchi Abd:  soft/+BS, NT Ext:  No edema, no lymphangitis, no synovitis, no rash   Condition at discharge: stable  The results of significant diagnostics from this hospitalization (including imaging, microbiology, ancillary and laboratory) are listed below for reference.   Imaging Studies: EEG adult Result Date: 05/04/2024 [place] Arlin KIDD, MD     05/04/2024  4:43 PM Patient Name: Jay Rivas MRN: [id number] Epilepsy Attending: Arlin KIDD Shelton Referring Physician/Provider: Evonnie Lenis, MD Date: 05/04/2024 Duration: 22.22 mins Patient history: 73yo M with ams and seizure. EEG to evaluate for seizure. Level of alertness: Awake AEDs during EEG study: Ativan  Technical aspects: This EEG study was done with scalp electrodes positioned according to the 10-20 International system of electrode placement. Electrical activity was reviewed with band pass filter of 1-70Hz , sensitivity of 7 uV/mm, display speed of 17mm/sec with a 60Hz  notched filter applied as appropriate. EEG data were recorded continuously and digitally stored.  Video monitoring was available and reviewed as appropriate. Description: EEG showed continuous generalized predominantly 5 to 7 Hz theta slowing admixed with intermittent 2-3hz  delta slowing. Hyperventilation and photic stimulation were not performed.   ABNORMALITY - Continuous slow, generalized IMPRESSION: This study is suggestive of moderate diffuse encephalopathy. No seizures or epileptiform discharges were seen throughout the recording. Arlin KIDD [place]   MR BRAIN WO CONTRAST Result Date: 05/04/2024 CLINICAL DATA:  Mental status change, unknown cause. EXAM: MRI HEAD WITHOUT CONTRAST TECHNIQUE: Multiplanar, multiecho pulse sequences of the brain and surrounding structures were obtained without intravenous contrast. COMPARISON:  Earlier same day CT head. FINDINGS: Brain: No acute infarct. No evidence of intracranial  hemorrhage. Mild T2/FLAIR hyperintensity in the periventricular and subcortical white matter. Generalized parenchymal volume loss. No edema, mass effect, or midline shift. Posterior fossa is unremarkable. Normal appearance of midline structures. The basilar cisterns are patent. No extra-axial fluid collections. Ventricles: Prominence of the lateral ventricles suggestive of underlying parenchymal volume loss. Vascular: Skull base flow voids are visualized. Skull and upper  cervical spine: No focal abnormality. Sinuses/Orbits: Mucosal thickening in the left maxillary sinus with a mucous retention cyst noted. Additional mucosal thickening in the ethmoid sinuses. Orbits are symmetric. Other: Mastoid air cells are clear. IMPRESSION: No acute intracranial abnormality. Mild chronic microvascular ischemic changes. Moderate generalized parenchymal volume loss. Electronically Signed   By: Donnice Mania M.D.   On: 05/04/2024 11:55   DG Chest Port 1 View Result Date: 05/04/2024 CLINICAL DATA:  weak EXAM: PORTABLE CHEST - 1 VIEW COMPARISON:  July 24, 2004 FINDINGS: Chronic coarsening of the pulmonary interstitium without overt pulmonary edema. No focal airspace consolidation, pleural effusion, or pneumothorax. No cardiomegaly. Mitraclip. Tortuous aorta with aortic atherosclerosis. No acute fracture or destructive lesions. Multilevel thoracic osteophytosis. IMPRESSION: No acute cardiopulmonary abnormality. Electronically Signed   By: Rogelia Myers M.D.   On: 05/04/2024 09:56   CT HEAD CODE STROKE WO CONTRAST Addendum Date: 05/04/2024 ADDENDUM REPORT: 05/04/2024 08:47 ADDENDUM: No evidence of an acute intracranial abnormality. These results were called by telephone at the time of interpretation on 05/04/2024 at 8:46 am to provider Dr. Voncile, who verbally acknowledged these results. Electronically Signed   By: Rockey Childs D.O.   On: 05/04/2024 08:47   Result Date: 05/04/2024 CLINICAL DATA:  Code stroke. Neuro deficit, acute, stroke suspected. Right-sided weakness. EXAM: CT HEAD WITHOUT CONTRAST TECHNIQUE: Contiguous axial images were obtained from the base of the skull through the vertex without intravenous contrast. RADIATION DOSE REDUCTION: This exam was performed according to the departmental dose-optimization program which includes automated exposure control, adjustment of the mA and/or kV according to patient size and/or use of iterative reconstruction technique. COMPARISON:  Head CT  08/11/2023. FINDINGS: Brain: Cerebral atrophy. Prominence of the ventricles and sulci, which appears commensurate. There is no acute intracranial hemorrhage. No demarcated cortical infarct. No extra-axial fluid collection. No evidence of an intracranial mass. No midline shift. Vascular: No hyperdense vessel.  Atherosclerotic calcifications. Skull: No calvarial fracture or aggressive osseous lesion. Sinuses/Orbits: No mass or acute finding within the imaged orbits. Mild mucosal thickening scattered within the paranasal sinuses at the imaged levels. ASPECTS (Alberta Stroke Program Early CT Score) - Ganglionic level infarction (caudate, lentiform nuclei, internal capsule, insula, M1-M3 cortex): 7 - Supraganglionic infarction (M4-M6 cortex): 3 Total score (0-10 with 10 being normal): 10 Attempts are being made to reach the ordering provider at this time. IMPRESSION: 1.  No evidence of an acute intracranial abnormality. 2. Generalized cerebral atrophy. Electronically Signed: By: Rockey Childs D.O. On: 05/04/2024 08:37    Microbiology: Results for orders placed or performed in visit on 08/29/16  Urine culture     Status: None   Collection Time: 08/29/16 11:31 AM   Specimen: Urine  Result Value Ref Range Status   Culture ESCHERICHIA COLI  Final   Colony Count Greater than 100,000 CFU/mL  Final   Organism ID, Bacteria ESCHERICHIA COLI  Final      Susceptibility   Escherichia coli -  (no method available)    AMPICILLIN <=2 Sensitive     AMOX/CLAVULANIC <=2 Sensitive     AMPICILLIN/SULBACTAM <=2 Sensitive     PIP/TAZO <=4 Sensitive  IMIPENEM <=0.25 Sensitive     CEFAZOLIN <=4 Not Reportable     CEFTRIAXONE <=1 Sensitive     CEFTAZIDIME <=1 Sensitive     CEFEPIME <=1 Sensitive     GENTAMICIN <=1 Sensitive     TOBRAMYCIN <=1 Sensitive     CIPROFLOXACIN  <=0.25 Sensitive     LEVOFLOXACIN  <=0.12 Sensitive     NITROFURANTOIN <=16 Sensitive     TRIMETH/SULFA* <=20 Sensitive      * NR=NOT REPORTABLE,SEE  COMMENTORAL therapy:A cefazolin MIC of <32 predicts susceptibility to the oral agents cefaclor,cefdinir,cefpodoxime,cefprozil,cefuroxime,cephalexin,and loracarbef when used for therapy of uncomplicated UTIs due to E.coli,K.pneumomiae,and P.mirabilis. PARENTERAL therapy: A cefazolinMIC of >8 indicates resistance to parenteralcefazolin. An alternate test method must beperformed to confirm susceptibility to parenteralcefazolin.    Labs: CBC: Recent Labs  Lab 05/04/24 0842  WBC 5.8  NEUTROABS 3.1  HGB 11.9*  HCT 34.9*  MCV 94.3  PLT 227   Basic Metabolic Panel: Recent Labs  Lab 05/04/24 0842 05/05/24 0505  NA 138 141  K 3.7 3.8  CL 100 104  CO2 24 29  GLUCOSE 134* 82  BUN 13 12  CREATININE 1.10 0.87  CALCIUM  9.1 9.1  MG  --  2.2   Liver Function Tests: Recent Labs  Lab 05/04/24 0842  AST 42*  ALT 44  ALKPHOS 57  BILITOT 0.7  PROT 6.2*  ALBUMIN 3.6   CBG: Recent Labs  Lab 05/04/24 0814  GLUCAP 134*    Discharge time spent: greater than 30 minutes.  Signed: Alm Schneider, MD [facility] 05/05/2024

## 2024-05-05 NOTE — Evaluation (Signed)
 Clinical/Bedside Swallow Evaluation Patient Details  Name: Jay Rivas MRN: [id number] Date of Birth: May 24, 1951  Today's Date: 05/05/2024 Time: SLP Start Time (ACUTE ONLY): 1446 SLP Stop Time (ACUTE ONLY): 1505 SLP Time Calculation (min) (ACUTE ONLY): 19 min  Past Medical History:  Past Medical History:  Diagnosis Date   Carpal tunnel syndrome on both sides    Dementia (HCC)    diagnosed in jan 2019, pt states medication seems to be helping, A&Ox4   HLD (hyperlipidemia)    Hypertension    Memory disorder 11/24/2017   Myocardial infarction [facility])    Dr. Levern   Spinal stenosis of cervical region    Past Surgical History:  Past Surgical History:  Procedure Laterality Date   ABDOMINAL SURGERY     CARDIAC CATHETERIZATION  2005   stent   CARPAL TUNNEL RELEASE Right    CARPAL TUNNEL RELEASE Right 03/09/2018   Procedure: RIGHT CARPAL TUNNEL RELEASE;  Surgeon: Murrell Arley, MD;  Location: [facility];  Service: Orthopedics;  Laterality: Right;   CARPAL TUNNEL RELEASE Left 11/02/2018   Procedure: LEFT CARPAL TUNNEL RELEASE;  Surgeon: Murrell Arley, MD;  Location: [facility];  Service: Orthopedics;  Laterality: Left;   TENDON TRANSFER Left 11/02/2018   Procedure: LEFT CAMITZ TRANSFER;  Surgeon: Murrell Arley, MD;  Location: [facility];  Service: Orthopedics;  Laterality: Left;   TRIGGER FINGER RELEASE Right 03/09/2018   Procedure: RELEASE TRIGGER FINGER/A-1 PULLEY RIGHT MIDDLE, RING AND SMALL FINGERS;  Surgeon: Murrell Arley, MD;  Location: [facility];  Service: Orthopedics;  Laterality: Right;   ULNAR NERVE TRANSPOSITION Left 11/02/2018   Procedure: LEFT ULNAR NERVE DECOMPRESSION;  Surgeon: Murrell Arley, MD;  Location: [facility];  Service: Orthopedics;  Laterality: Left;   ULNAR TUNNEL RELEASE Right 03/09/2018   Procedure: CUBITAL TUNNEL DECOMPRESSION RIGHT ELBOW;  Surgeon: Murrell Arley, MD;  Location: [facility];  Service: Orthopedics;  Laterality: Right;   HPI:  73 year old male with a history of parkinsonism, dementia, cortical basilar degeneration, hypertension, coronary disease, hyperlipidemia presenting with seizure-like activity. The patient currently resides at [facility] (x 1 week). The patient woke up in his usual state of health around 6:30 AM on 05/04/2024.  Around 7:40 AM, the patient was at breakfast when he developed tonic-clonic activity. 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 BSE/SLE requested.    Assessment / Plan / Recommendation  Clinical Impression  Clinical swallow evaluation completed at bedside with spouse present. She indicates that Jay Rivas recently moved from home to the [facility] home about a week ago. He received some SLP services at home through [facility] for swallowing and communication. Oral motor exam reveals upper dentures, no lower dentition, and reduced coordination with rapidly alternating movement. Pt consumed thin water via straw sips, puree, and self presented a peanut butter cracker without overt signs/symptoms of aspiration. Pt with prolonged oral transit with solids and min oral residue. He benefited from liquid wash. Pt is at risk for aspiration given diagnosis of corticobasal degeneration (impaired cognition and reduced coordination with oral strutures), however he is appropriate for regular textures and thin liquids with supervision and assist with meals. Pt able to self feed with finger foods. Ok for PO medication with water. Pt/wife in agreement with plan of care. Recommend f/u SLP services intermittently at next venue of care to assist with changing needs per his diagnosis. SLP Visit Diagnosis: Dysphagia, unspecified (R13.10)    Aspiration Risk  Mild aspiration risk    Diet Recommendation Regular;Thin liquid    Liquid Administration via: Straw;Cup Medication Administration: Whole meds with liquid Supervision: Staff to  assist with self feeding Compensations: Slow rate;Small sips/bites Postural Changes: Seated upright at 90 degrees;Remain upright for at least 30 minutes after po intake    Other  Recommendations Oral Care Recommendations: Oral care BID;Staff/trained caregiver to provide oral care     Assistance Recommended at Discharge    Functional Status Assessment Patient has not had a recent decline in their functional status  Frequency and Duration            Prognosis Prognosis for improved oropharyngeal function: Good Barriers to Reach Goals: Cognitive deficits      Swallow Study   General Date of Onset: 05/04/24 HPI: 73 year old male with a history of parkinsonism, dementia, cortical basilar degeneration, hypertension, coronary disease, hyperlipidemia presenting with seizure-like activity. The patient currently resides at [facility] (x 1 week). The patient woke up in his usual state of health around 6:30 AM on 05/04/2024.  Around 7:40 AM, the patient was at breakfast when he developed tonic-clonic activity. 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 BSE/SLE requested. Type of Study: Bedside Swallow Evaluation Previous Swallow Assessment: N/A Diet Prior to this Study: Regular;Thin liquids (Level 0) Temperature Spikes Noted: No Respiratory Status: Room air History of Recent Intubation: No Behavior/Cognition: Alert;Cooperative;Pleasant mood;Requires cueing Oral Cavity Assessment: Within Functional Limits Oral Care Completed by SLP: Recent completion by staff Oral Cavity - Dentition: Dentures, top;Edentulous Vision: Impaired for self-feeding Self-Feeding Abilities: Needs assist;Needs set up Patient Positioning: Upright in bed Baseline Vocal Quality: Breathy;Low vocal intensity Volitional Cough: Strong Volitional Swallow: Able to elicit    Oral/Motor/Sensory Function Overall Oral Motor/Sensory Function:  (reduced coordination)   Ice Chips Ice chips: Not tested    Thin Liquid Thin Liquid: Within functional limits Presentation: Straw    Nectar Thick Nectar Thick Liquid: Not tested   Honey Thick Honey Thick Liquid: Not tested   Puree Puree: Within functional limits Presentation: Spoon   Solid     Solid: Within functional limits Presentation: Self Fed     Thank you,  Lamar Candy, CCC-SLP [phone]  Jay Rivas 05/05/2024,3:43 PM

## 2024-05-05 NOTE — Plan of Care (Signed)
  Problem: Acute Rehab PT Goals(only PT should resolve) Goal: Pt Will Go Supine/Side To Sit Outcome: Progressing Flowsheets (Taken 05/05/2024 1231) Pt will go Supine/Side to Sit:  with minimal assist  with contact guard assist Goal: Patient Will Transfer Sit To/From Stand Outcome: Progressing Flowsheets (Taken 05/05/2024 1231) Patient will transfer sit to/from stand:  with minimal assist  with moderate assist Goal: Pt Will Transfer Bed To Chair/Chair To Bed Outcome: Progressing Flowsheets (Taken 05/05/2024 1231) Pt will Transfer Bed to Chair/Chair to Bed:  with min assist  with mod assist Goal: Pt Will Ambulate Outcome: Progressing Flowsheets (Taken 05/05/2024 1231) Pt will Ambulate:  25 feet  with minimal assist  with moderate assist  with rolling walker   12:31 PM, 05/05/24 Lynwood Music, MPT Physical Therapist with [facility] [phone] office [phone] mobile phone

## 2024-05-05 NOTE — Plan of Care (Signed)
  Problem: Acute Rehab OT Goals (only OT should resolve) Goal: Pt. Will Perform Grooming Flowsheets (Taken 05/05/2024 0924) Pt Will Perform Grooming: with min assist Goal: Pt. Will Perform Upper Body Dressing Flowsheets (Taken 05/05/2024 0924) Pt Will Perform Upper Body Dressing: with min assist Goal: Pt. Will Perform Lower Body Dressing Flowsheets (Taken 05/05/2024 0924) Pt Will Perform Lower Body Dressing: with min assist Goal: Pt. Will Transfer To Toilet Flowsheets (Taken 05/05/2024 [phone]) Pt Will Transfer to Toilet: with min assist Goal: Pt. Will Perform Toileting-Clothing Manipulation Flowsheets (Taken 05/05/2024 0924) Pt Will Perform Toileting - Clothing Manipulation and hygiene: with min assist Goal: Pt/Caregiver Will Perform Home Exercise Program Flowsheets (Taken 05/05/2024 [phone]) Pt/caregiver will Perform Home Exercise Program:  Increased ROM  Increased strength  Both right and left upper extremity  Independently  Hadlea Furuya OT, MOT

## 2024-05-05 NOTE — Progress Notes (Signed)
 I connected with  Jay Rivas on 05/05/24 by a video enabled telemedicine application and verified that I am speaking with the correct person using two identifiers.   I discussed the limitations of evaluation and management by telemedicine. The patient is unable to express understanding or agreed to proceed.  Location of the provider: [facility] Location of the patient: [facility]    Subjective: NAEO. NO new concerns, Sitter in room. Patient on recliner  ROS: UTO due to ams  Examination  Vital signs in last 24 hours: Temp:  [97.6 F (36.4 C)-98.5 F (36.9 C)] 97.6 F (36.4 C) (07/10 0459) Pulse Rate:  [57-107] 107 (07/10 0459) Resp:  [13-18] 16 (07/10 0459) BP: (122-152)/(69-93) 144/87 (07/10 0459) SpO2:  [95 %-100 %] 100 % (07/10 0459) Weight:  [61 kg] 61 kg (07/09 1800)  General: sitting in recliner, NAD Neuro: awake, alert, didn't answer orientation questions, just repeats the questions, doesn't follow commands, able to mimic, CN grossly intact, antigravity strength in all extremities, myoclonic jerks not noted on exam today   Basic Metabolic Panel: Recent Labs  Lab 05/04/24 0842 05/05/24 0505  NA 138 141  K 3.7 3.8  CL 100 104  CO2 24 29  GLUCOSE 134* 82  BUN 13 12  CREATININE 1.10 0.87  CALCIUM  9.1 9.1  MG  --  2.2    CBC: Recent Labs  Lab 05/04/24 0842  WBC 5.8  NEUTROABS 3.1  HGB 11.9*  HCT 34.9*  MCV 94.3  PLT 227     Coagulation Studies: Recent Labs    05/04/24 0842  LABPROT 13.9  INR 1.0    Imaging personally reviewed  MR brain wo contrast 05/04/2024: No acute intracranial abnormality. Mild chronic microvascular ischemic changes. Moderate generalized parenchymal volume loss.   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    Seizure like activity - Unclear if it was  epileptic seizure vs worsening of baseline myoclonic jerks ( documented in previous outpatient notes as well) - Due to absence of epileptiform activity on eeg and no cortical abnormality on mri, I will hold off on maintenance Anti seizure meds. - However, if this recurs, can consider starting depakote 250mg  BID as it can help with seizures as well as myoclonic jerks - B12 supplemtantion with goal b12 over 500 - [facility] - Discussed with Dr Tat via secure chat    I personally spent a total of 36 minutes in the care of the patient today including getting/reviewing separately obtained history, performing a medically appropriate exam/evaluation, counseling and educating, placing orders, referring and communicating with other health care professionals, documenting clinical information in the EHR, independently interpreting results, and coordinating care.          Arlin Krebs Epilepsy [facility] For questions after 5pm please refer to AMION to reach the Neurologist on call

## 2024-05-10 ENCOUNTER — Ambulatory Visit (INDEPENDENT_AMBULATORY_CARE_PROVIDER_SITE_OTHER): Admitting: Family Medicine

## 2024-05-10 ENCOUNTER — Encounter: Payer: Self-pay | Admitting: Family Medicine

## 2024-05-10 VITALS — BP 110/62 | HR 67 | Temp 97.5°F | Ht 68.0 in | Wt 134.0 lb

## 2024-05-10 DIAGNOSIS — R569 Unspecified convulsions: Secondary | ICD-10-CM | POA: Diagnosis not present

## 2024-05-10 NOTE — Progress Notes (Signed)
 Subjective:    Patient ID: Jay Rivas, male    DOB: 09/10/1951, 73 y.o.   MRN: [id number]   Patient has a history of corticobasal degeneration.  Patient recently had to be transition to a skilled nursing facility due to his immobility and confusion as his wife was no longer able to care for him at home.  Was recently hospitalized with seizure-like activity: Admit date:     05/04/2024  Discharge date: 05/05/24  Discharge Physician: Alm Tat    PCP: Duanne Butler DASEN, MD      Hospital Course: 73 year old male with a history of parkinsonism, dementia, cortical basilar degeneration, hypertension, coronary disease, hyperlipidemia presenting with seizure-like activity.  History is supplemented by review of the medical record and speaking with the patient's family at the bedside.  The patient currently resides at [facility].  The patient woke up in his usual state of health around 6:30 AM on 05/04/2024.  Around 7:40 AM, the patient was at breakfast when he developed tonic-clonic activity.  He is unclear of the duration.  Apparently the patient would not spit his food out and had the staff manually remove fluid from his mouth.  Apparently the patient was somnolent after the episode.  He remained somnolent upon arrival to the emergency department.  EMS was activated and code stroke was initiated prior to arrival.  Ativan  1 mg was given at 0830.  Since then, the patient has had improving mental status.  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  Son notes that his usual myoclonus has actually improved after being given Ativan .  The patient has not been on any new medications. At baseline, the patient has apraxia and dysphasia and dysphonia.  He is able to bear weight and take some steps with assistance.  He requires at least 1 person assistance for transfers.  There are no reports of emesis, diarrhea, uncontrolled pain, vomiting, respiratory  distress.   The patient has been seen by [facility] neurology at [facility] in the past as an outpatient.  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  The patient has had difficulty with repetitive movements.  Memantine was discontinued secondary to dizziness. Previously tried on Carbidopa -Levodopa  without benefit, caused cognitive issues referral to the movement disorder clinic was declined per previous documentation.   In the ED, the patient was afebrile and hemodynamically stable with oxygen saturation 96% room air.  WBC 5.8, hemoglobin 11.9, platelets 227.  Sodium 138, potassium 3.7, bicarbonate 24, serum creatinine 1.10.  AST 42, ALT 44, alk phos 357, total bilirubin 0.7.  Albumin 3.6, calcium  9.1.  INR 1.0.  CT of the brain was negative for acute findings.  MRI of the brain was negative for acute findings.  Chest x-ray was negative for any infiltrates or edema.  Neurology was consulted and recommended admission for workup of seizure and possible stroke.   Assessment and Plan: Seizure-like activity -EEG--neg for seizure - UA negative for pyuria - MRI brain negative for acute findings - appreciate neurology consult>>would not start AEDs at this time; follow up with [facility] - Echo--EF 60-65%, G1DD, normal RVF, no PFO, trivial MR   Acute metabolic encephalopathy - Patient was somnolent  at the time of presentation not following commands - At baseline, the patient does have apraxia but is able to follow commands - Concern for postictal state - UA negative for pyuria - B12--287>>would like this >400; family agrees with supplementation -  TSH--6.474; Free T4--0.69 - Folic acid --8.4>>would like this over 10; family agrees with supplementation - MRI brain negative for acute findings - 05/05/24--mental status back to baseline   Major neurocognitive disorder - Continue Aricept  - follow up with [facility]   Parkinsonism - At baseline, patient has bradykinesia and cogwheel rigidity - Poor tolerance to Sinemet  - follow up [facility]   Essential hypertension - Holding losartan  temporarily>>restart after d/c   Coronary artery disease - Continue aspirin     05/10/24 Patient is here today with his son for follow-up.  I discussed the recommendations of the neurologist with the patient and his son regarding starting Depakote 250 mg twice daily for seizure prevention.  It sounds like the patient had an unprovoked seizure while eating.  His son states that he was sitting at the table, developed tonic-clonic activity all over his body, his eyes rolled back in his head, and he was unresponsive.  He then had a postictal state for at least 30 minutes.  At discharge, they recommended not starting the Depakote.  I discussed with the patient and his son whether they would like to start this medication to help prevent future seizures.  At the present time, the son is uncertain whether he feels that his father would benefit from this. Past Medical History:  Diagnosis Date   Carpal tunnel syndrome on both sides    Dementia (HCC)    diagnosed in jan 2019, pt states medication seems to be helping, A&Ox4   HLD (hyperlipidemia)    Hypertension    Memory disorder 11/24/2017   Myocardial infarction [facility])    Dr. Levern   Spinal stenosis of cervical region    Past Surgical History:  Procedure Laterality Date   ABDOMINAL SURGERY     CARDIAC CATHETERIZATION  2005   stent   CARPAL TUNNEL RELEASE Right    CARPAL TUNNEL RELEASE Right 03/09/2018   Procedure: RIGHT CARPAL TUNNEL RELEASE;  Surgeon: Murrell Kuba, MD;  Location: [facility];  Service: Orthopedics;  Laterality: Right;   CARPAL TUNNEL RELEASE Left 11/02/2018   Procedure: LEFT CARPAL TUNNEL RELEASE;  Surgeon: Murrell Kuba, MD;  Location: [facility];  Service: Orthopedics;  Laterality: Left;   TENDON TRANSFER Left 11/02/2018   Procedure: LEFT CAMITZ TRANSFER;  Surgeon: Murrell Kuba, MD;  Location: [facility];  Service: Orthopedics;  Laterality: Left;   TRIGGER FINGER RELEASE Right 03/09/2018   Procedure: RELEASE TRIGGER FINGER/A-1 PULLEY RIGHT MIDDLE, RING AND SMALL FINGERS;  Surgeon: Murrell Kuba, MD;  Location: [facility];  Service: Orthopedics;  Laterality: Right;   ULNAR NERVE TRANSPOSITION Left 11/02/2018   Procedure: LEFT ULNAR NERVE DECOMPRESSION;  Surgeon: Murrell Kuba, MD;  Location: [facility];  Service: Orthopedics;  Laterality: Left;   ULNAR TUNNEL RELEASE Right 03/09/2018   Procedure: CUBITAL TUNNEL DECOMPRESSION RIGHT ELBOW;  Surgeon: Murrell Kuba, MD;  Location: [facility];  Service: Orthopedics;  Laterality: Right;   Current Outpatient Medications on File Prior to Visit  Medication Sig Dispense Refill   aspirin  EC 81 MG tablet Take 81 mg by mouth daily.     Cholecalciferol (QC VITAMIN D3) 25 MCG (1000 UT) tablet Take 1,000 Units by mouth daily.  donepezil  (ARICEPT ) 10 MG tablet TAKE 1 TABLET BY MOUTH EVERYDAY AT BEDTIME 90 tablet 3   folic acid  (FOLVITE ) 1 MG tablet Take 1 tablet (1 mg total) by mouth daily.     losartan  (COZAAR ) 100 MG tablet Take 100 mg by mouth daily.     Psyllium (METAMUCIL PO) Take 1 tablet by mouth daily.     vitamin B-12 (VITAMIN B12) 500 MCG tablet Take 1 tablet (500 mcg total) by mouth daily.     No current facility-administered medications on file prior to visit.   Allergies  Allergen Reactions   Penicillin G Anaphylaxis   Social History   Socioeconomic History   Marital status: Married    Spouse name: Aadam Zhen   Number of  children: 2   Years of education: 12   Highest education level: High school graduate  Occupational History   Not on file  Tobacco Use   Smoking status: Former    Passive exposure: Past   Smokeless tobacco: Former   Tobacco comments:    quit 1990  Vaping Use   Vaping status: Never Used  Substance and Sexual Activity   Alcohol use: Not Currently    Alcohol/week: 24.0 standard drinks of alcohol    Types: 24 Cans of beer per week    Comment: social beer only   Drug use: No   Sexual activity: Yes    Partners: Female  Other Topics Concern   Not on file  Social History Narrative   Lives with wife, Albino.    1 son and 1 daughter.   2 grandchildren.   Right handed    Social Drivers of Health   Financial Resource Strain: Low Risk  (12/21/2023)   Overall Financial Resource Strain (CARDIA)    Difficulty of Paying Living Expenses: Not hard at all  Food Insecurity: No Food Insecurity (05/04/2024)   Hunger Vital Sign    Worried About Running Out of Food in the Last Year: Never true    Ran Out of Food in the Last Year: Never true  Transportation Needs: No Transportation Needs (05/04/2024)   PRAPARE - Administrator, Civil Service (Medical): No    Lack of Transportation (Non-Medical): No  Physical Activity: Sufficiently Active (12/21/2023)   Exercise Vital Sign    Days of Exercise per Week: 5 days    Minutes of Exercise per Session: 50 min  Stress: No Stress Concern Present (12/21/2023)   [employer] of Occupational Health - Occupational Stress Questionnaire    Feeling of Stress : Not at all  Social Connections: Socially Integrated (05/04/2024)   Social Connection and Isolation Panel    Frequency of Communication with Friends and Family: More than three times a week    Frequency of Social Gatherings with Friends and Family: More than three times a week    Attends Religious Services: More than 4 times per year    Active Member of [employer] or Organizations: Yes    Attends  Banker Meetings: More than 4 times per year    Marital Status: Married  Catering manager Violence: Not At Risk (05/04/2024)   Humiliation, Afraid, Rape, and Kick questionnaire    Fear of Current or Ex-Partner: No    Emotionally Abused: No    Physically Abused: No    Sexually Abused: No   No family history on file.     Review of Systems  All other systems reviewed and are negative.      Objective:  Physical Exam Vitals reviewed.  Constitutional:      General: He is not in acute distress.    Appearance: He is well-developed. He is not diaphoretic.  HENT:     Head: Normocephalic and atraumatic.     Right Ear: External ear normal.     Left Ear: External ear normal.     Nose: Nose normal.     Mouth/Throat:     Pharynx: No oropharyngeal exudate.  Eyes:     General: No scleral icterus.       Right eye: No discharge.        Left eye: No discharge.     Conjunctiva/sclera: Conjunctivae normal.     Pupils: Pupils are equal, round, and reactive to light.  Neck:     Thyroid : No thyromegaly.     Vascular: No JVD.  Cardiovascular:     Rate and Rhythm: Normal rate and regular rhythm.     Heart sounds: Normal heart sounds. No murmur heard.    No friction rub. No gallop.  Pulmonary:     Effort: Pulmonary effort is normal. No respiratory distress.     Breath sounds: Normal breath sounds. No wheezing or rales.  Chest:     Chest wall: No tenderness.  Abdominal:     General: Bowel sounds are normal. There is no distension.     Palpations: Abdomen is soft. There is no mass.     Tenderness: There is no abdominal tenderness. There is no guarding or rebound.  Musculoskeletal:     Cervical back: Normal range of motion and neck supple.  Lymphadenopathy:     Cervical: No cervical adenopathy.  Neurological:     Mental Status: He is alert. He is disoriented.     Cranial Nerves: No cranial nerve deficit.     Sensory: Sensation is intact.     Motor: Weakness and tremor  present.     Coordination: Romberg sign positive. Coordination abnormal. Finger-Nose-Finger Test abnormal.     Gait: Gait abnormal.     Deep Tendon Reflexes: Reflexes are normal and symmetric.  Psychiatric:        Behavior: Behavior normal.        Thought Content: Thought content normal.        Judgment: Judgment normal.       Assessment & Plan:  Observed seizure-like activity (HCC) EEG revealed no epileptiform focus.  However, history supports a seizure.  I believe that this was unprovoked and that the patient may be at high risk for future seizures.  I would recommend trying the Depakote 250 mg twice daily for prevention of future seizures as this may his myoclonus.  I discussed this at length with the patient and his son.  They would like to discuss this with other family members and let me know their decision.  I would recommend rechecking a CBC to monitor his anemia along with a B12 CMP and a folic acid  level in 3 months

## 2024-05-12 ENCOUNTER — Telehealth: Payer: Self-pay

## 2024-05-12 ENCOUNTER — Other Ambulatory Visit: Payer: Self-pay

## 2024-05-12 DIAGNOSIS — G629 Polyneuropathy, unspecified: Secondary | ICD-10-CM

## 2024-05-12 DIAGNOSIS — R569 Unspecified convulsions: Secondary | ICD-10-CM

## 2024-05-12 DIAGNOSIS — F03B0 Unspecified dementia, moderate, without behavioral disturbance, psychotic disturbance, mood disturbance, and anxiety: Secondary | ICD-10-CM

## 2024-05-12 DIAGNOSIS — G20C Parkinsonism, unspecified: Secondary | ICD-10-CM

## 2024-05-12 DIAGNOSIS — G3185 Corticobasal degeneration: Secondary | ICD-10-CM

## 2024-05-12 DIAGNOSIS — R269 Unspecified abnormalities of gait and mobility: Secondary | ICD-10-CM

## 2024-05-12 DIAGNOSIS — D509 Iron deficiency anemia, unspecified: Secondary | ICD-10-CM

## 2024-05-12 DIAGNOSIS — I10 Essential (primary) hypertension: Secondary | ICD-10-CM

## 2024-05-12 DIAGNOSIS — E785 Hyperlipidemia, unspecified: Secondary | ICD-10-CM

## 2024-05-12 MED ORDER — DONEPEZIL HCL 10 MG PO TABS: ORAL_TABLET | ORAL | 3 refills | Status: AC

## 2024-05-12 MED ORDER — FOLIC ACID 1 MG PO TABS: 1.0000 mg | ORAL_TABLET | Freq: Every day | ORAL | 1 refills | Status: DC

## 2024-05-12 MED ORDER — QC VITAMIN D3 25 MCG (1000 UT) PO TABS: 1000.0000 [IU] | ORAL_TABLET | Freq: Every day | ORAL | 1 refills | Status: AC

## 2024-05-12 MED ORDER — LOSARTAN POTASSIUM 100 MG PO TABS: 100.0000 mg | ORAL_TABLET | Freq: Every day | ORAL | 3 refills | Status: DC

## 2024-05-12 MED ORDER — ASPIRIN EC 81 MG PO TBEC: 81.0000 mg | DELAYED_RELEASE_TABLET | Freq: Every day | ORAL | 1 refills | Status: DC

## 2024-05-12 MED ORDER — CYANOCOBALAMIN 500 MCG PO TABS: 500.0000 ug | ORAL_TABLET | Freq: Every day | ORAL | 1 refills | Status: DC

## 2024-05-12 MED ORDER — METAMUCIL 0.36 G PO CAPS: 0.3600 g | ORAL_CAPSULE | Freq: Every day | ORAL | 1 refills | Status: DC

## 2024-05-12 NOTE — Telephone Encounter (Signed)
 DUPLICATE. RX'S SENT EARLIER TODAY. MJP,LPN   Copied from CRM [id number]. Topic: Clinical - Prescription Issue >> May 12, 2024 11:50 AM Berwyn MATSU wrote: Reason for CRM: [name]  called in requesting is MD can resubmit medications as they way it was sent they can not be filled or orders.   Phamracy needs dosage for Vitamin D3 and k2 so they can fill.   Jay Rivas [place], [place] - [address] [phone] [address]  [place] [address]   Update order for [facility] capsules needs to be corrected on order as patient takes 2 at bed time.  As it was incorrect    If questions please contact leslie at [phone].   May you please assist. >> May 12, 2024  1:57 PM Donna BRAVO wrote: [name] [phone] called in spoke with pharmacist they received the current medication list, with no corrections listed Sonny requesting new dosage for Vitamin D3/ k2   dosage was not on the prescription Update order for [facility] capsules needs to be corrected on order as patient takes 2 at bed time  Please call [facility] with any questions   Jay Rivas [place], [place] - [address] [phone] [address]  [place] [address]

## 2024-05-12 NOTE — Telephone Encounter (Signed)
 I was able to send in all the medications except for the [facility] and K2. I was unable to locate them in the medication lookup. Thank you.    Copied from CRM [id number]. Topic: Clinical - Prescription Issue >> May 12, 2024 11:50 AM Berwyn MATSU wrote: Reason for CRM: [name]  called in requesting is MD can resubmit medications as they way it was sent they can not be filled or orders.   Phamracy needs dosage for Vitamin D3 and k2 so they can fill.   Jay Rivas, [place] - [address] [phone] [address] [place]  [address]   Update order for [facility] capsules needs to be corrected on order as patient takes 2 at bed time.  As it was incorrect    If questions please contact leslie at [phone].   May you please assist.

## 2024-05-12 NOTE — Telephone Encounter (Signed)
 Copied from CRM [id number]. Topic: Clinical - Prescription Issue >> May 12, 2024  2:32 PM Tobias CROME wrote: Reason for CRM: Erminio w/ [facility] pharmacy states they received prescription for Psyllium (METAMUCIL) 0.36 g CAPS. Erminio states patient was just sent a bottle of gummies on 04/27/24.  Erminio states patient has been on gummies and inquiring if provider wants patient to be on capsules or stay on gummies.

## 2024-05-12 NOTE — Telephone Encounter (Signed)
 Did you want Jay Rivas to be on Vitamin D and K2? Thanks.   I was able to send in all the medications except for the [facility] and K2. I was unable to locate them in the medication lookup. Thank you.      Copied from CRM [id number]. Topic: Clinical - Prescription Issue >> May 12, 2024 11:50 AM Jay Rivas wrote: Reason for CRM: [facility]  [facility] in requesting is MD can resubmit medications as they way it was sent they can not be filled or orders.    Phamracy needs dosage for Vitamin D3 and k2 so they can fill.    [facility] - [place], [place] - [phone] [place] [address]

## 2024-05-12 NOTE — Telephone Encounter (Signed)
 Copied from CRM [id number]. Topic: Clinical - Medical Advice >> May 12, 2024 12:05 PM Jay Rivas wrote: Reason for CRM: son said he would like to go ahead and start him on the medication the meds is depakote 250mg  he asked if Dr.pickard could go ahead and send it to [facility].

## 2024-05-13 ENCOUNTER — Other Ambulatory Visit: Payer: Self-pay | Admitting: Family Medicine

## 2024-05-13 MED ORDER — VITAMIN K2-VITAMIN D3 90-125 MCG PO CAPS: 1.0000 | ORAL_CAPSULE | Freq: Every day | ORAL | 3 refills | Status: AC

## 2024-05-13 NOTE — Telephone Encounter (Signed)
 Spoke to [place] confirmed dosage advised by Dr. Duanne. She stated she would get it filled and to the patient today.

## 2024-05-13 NOTE — Telephone Encounter (Signed)
 Pharmacy requests call back for clarification.    Copied from CRM [id number]. Topic: Clinical - Medication Question >> May 13, 2024  9:40 AM Willma SAUNDERS wrote: Reason for CRM: Therisa from [facility] calling in regards to prescription for Depakote 250 mg. Has questions in regards to whether is should be extended release or delayed release. Would like a call back.  Therisa can be reached at [phone]

## 2024-05-26 ENCOUNTER — Other Ambulatory Visit: Payer: Self-pay | Admitting: Family Medicine

## 2024-05-26 ENCOUNTER — Telehealth: Payer: Self-pay

## 2024-05-26 DIAGNOSIS — G3185 Corticobasal degeneration: Secondary | ICD-10-CM

## 2024-05-26 DIAGNOSIS — G629 Polyneuropathy, unspecified: Secondary | ICD-10-CM

## 2024-05-26 NOTE — Telephone Encounter (Signed)
 Copied from CRM #[id number]. Topic: Clinical - Prescription Issue >> May 25, 2024  4:39 PM Deleta RAMAN wrote: Reason for CRM: Patient needs 2 tablets instead of 1 for [facility] mane please follow up with Aimee the administrator at the facility patient resides at [phone]

## 2024-05-27 ENCOUNTER — Telehealth: Payer: Self-pay | Admitting: Family Medicine

## 2024-05-27 ENCOUNTER — Other Ambulatory Visit: Payer: Self-pay | Admitting: Family Medicine

## 2024-05-27 DIAGNOSIS — G20C Parkinsonism, unspecified: Secondary | ICD-10-CM

## 2024-05-27 DIAGNOSIS — G629 Polyneuropathy, unspecified: Secondary | ICD-10-CM

## 2024-05-27 DIAGNOSIS — G3185 Corticobasal degeneration: Secondary | ICD-10-CM

## 2024-05-27 NOTE — Telephone Encounter (Signed)
 Requested medications are due for refill today.  unsure  Requested medications are on the active medications list.  yes  Last refill. 05/12/2024 #90 1 rf  Future visit scheduled.   no  Notes to clinic.  Unclear is rx went to pharmacy - no receipt.    Requested Prescriptions  Pending Prescriptions Disp Refills   folic acid  (FOLVITE ) 1 MG tablet      Sig: Take 1 tablet (1 mg total) by mouth daily.     Endocrinology:  Vitamins Passed - 05/27/2024  4:18 PM      Passed - Valid encounter within last 12 months    Recent Outpatient Visits           2 weeks ago Observed seizure-like activity (HCC)   [facility], Butler DASEN, MD   1 month ago Screening-pulmonary TB   [place] [facility], Butler DASEN, MD   5 months ago Corticobasal degeneration [facility])   [facility] Duanne Butler DASEN, MD   8 months ago Corticobasal degeneration [facility])   [facility] Duanne Butler DASEN, MD   12 months ago Corticobasal degeneration [facility])   [place] [facility] Pickard, Butler DASEN, MD               cyanocobalamin  (VITAMIN B12) 500 MCG tablet      Sig: Take 1 tablet (500 mcg total) by mouth daily.     Endocrinology:  Vitamins - Vitamin B12 Failed - 05/27/2024  4:18 PM      Failed - HCT in normal range and within 360 days    HCT  Date Value Ref Range Status  05/04/2024 34.9 (L) 39.0 - 52.0 % Final  11/30/2013 42.4 40.0 - 52.0 % Final         Failed - HGB in normal range and within 360 days    Hemoglobin  Date Value Ref Range Status  05/04/2024 11.9 (L) 13.0 - 17.0 g/dL Final   HGB  Date Value Ref Range Status  11/30/2013 14.5 13.0 - 18.0 g/dL Final         Passed - B12 Level in normal range and within 360 days    Vitamin B-12  Date Value Ref Range Status  05/04/2024 287 180 - 914 pg/mL Final    Comment:    (NOTE) This assay is not validated for testing  neonatal or myeloproliferative syndrome specimens for Vitamin B12 levels. Performed at [facility], [address] [place], [place] [address]          Passed - Valid encounter within last 12 months    Recent Outpatient Visits           2 weeks ago Observed seizure-like activity [facility])   [place] Pickard, Butler DASEN, MD   1 month ago Screening-pulmonary TB   [place] [facility] Pickard, Butler DASEN, MD   5 months ago Corticobasal degeneration [facility])   [place] Duanne Butler DASEN, MD   8 months ago Corticobasal degeneration [facility])   [place] [facility] Pickard, Butler DASEN, MD   12 months ago Corticobasal degeneration [facility])    [facility] Pickard, Butler DASEN, MD

## 2024-05-27 NOTE — Telephone Encounter (Signed)
 Copied from CRM [id number]. Topic: Clinical - Medication Refill >> May 27, 2024 12:22 PM [name] wrote: Please call pharmacy if discontinued or new order  Medication: folic acid  (FOLVITE ) 1 MG tablet cyanocobalamin  (VITAMIN B12) 500 MCG tablet   Has the patient contacted their pharmacy? Yes (Agent: If no, request that the patient contact the pharmacy for the refill. If patient does not wish to contact the pharmacy document the reason why and proceed with request.) (Agent: If yes, when and what did the pharmacy advise?)  This is the patient's preferred pharmacy:  [facility] [place], [place] - [address] [place] [address] Phone: [phone] Fax: [phone]  Is this the correct pharmacy for this prescription? Yes If no, delete pharmacy and type the correct one.   Has the prescription been filled recently? Yes  Is the patient out of the medication? No  Has the patient been seen for an appointment in the last year OR does the patient have an upcoming appointment? Yes  Can we respond through MyChart? No  Agent: Please be advised that Rx refills may take up to 3 business days. We ask that you follow-up with your pharmacy.

## 2024-05-27 NOTE — Telephone Encounter (Unsigned)
 Copied from CRM [id number]. Topic: Clinical - Medication Refill >> May 27, 2024  8:41 AM Burnard DEL wrote: Medication: Allegra 180 mg PRN  Has the patient contacted their pharmacy? No (Agent: If no, request that the patient contact the pharmacy for the refill. If patient does not wish to contact the pharmacy document the reason why and proceed with request.) (Agent: If yes, when and what did the pharmacy advise?)  This is the patient's preferred pharmacy:  [facility] [place], [place] - [address] [place] [address] Phone: [phone] Fax: [phone]  Is this the correct pharmacy for this prescription? Yes If no, delete pharmacy and type the correct one.   Has the prescription been filled recently? No  Is the patient out of the medication? Yes  Has the patient been seen for an appointment in the last year OR does the patient have an upcoming appointment? Yes  Can we respond through MyChart? No  Agent: Please be advised that Rx refills may take up to 3 business days. We ask that you follow-up with your pharmacy. **Patient into the hospital and they D/C this medication, [facility] would like to know if the prescription could resent to the pharmacy?**

## 2024-05-30 ENCOUNTER — Other Ambulatory Visit: Payer: Self-pay

## 2024-05-30 MED ORDER — FEXOFENADINE HCL 180 MG PO TABS: 180.0000 mg | ORAL_TABLET | Freq: Every day | ORAL | 3 refills | Status: DC

## 2024-05-30 NOTE — Telephone Encounter (Signed)
 Sent to pharmacy

## 2024-05-31 MED ORDER — CYANOCOBALAMIN 500 MCG PO TABS: 500.0000 ug | ORAL_TABLET | Freq: Every day | ORAL | Status: DC

## 2024-05-31 MED ORDER — FOLIC ACID 1 MG PO TABS: 1.0000 mg | ORAL_TABLET | Freq: Every day | ORAL | Status: DC

## 2024-06-02 ENCOUNTER — Other Ambulatory Visit: Payer: Self-pay | Admitting: Family Medicine

## 2024-06-02 ENCOUNTER — Other Ambulatory Visit: Payer: Self-pay

## 2024-06-02 ENCOUNTER — Telehealth: Payer: Self-pay

## 2024-06-02 DIAGNOSIS — G629 Polyneuropathy, unspecified: Secondary | ICD-10-CM

## 2024-06-02 DIAGNOSIS — G3185 Corticobasal degeneration: Secondary | ICD-10-CM

## 2024-06-02 DIAGNOSIS — G20C Parkinsonism, unspecified: Secondary | ICD-10-CM

## 2024-06-02 MED ORDER — FOLIC ACID 1 MG PO TABS: 1.0000 mg | ORAL_TABLET | Freq: Every day | ORAL | 1 refills | Status: DC

## 2024-06-02 MED ORDER — CYANOCOBALAMIN 500 MCG PO TABS: 500.0000 ug | ORAL_TABLET | Freq: Every day | ORAL | 1 refills | Status: AC

## 2024-06-02 NOTE — Telephone Encounter (Signed)
 Copied from CRM [id number]. Topic: Clinical - Prescription Issue >> Jun 02, 2024  9:12 AM Robinson H wrote: Reason for CRM: Therisa with [facility] calling to request refills for cyanocobalamin  (VITAMIN B12) 500 MCG tablet and folic acid  (FOLVITE ) 1 MG tablet. Medication shows discontinued on 8/5. Medication also shows OTC as well  Therisa Lorenzo [phone]

## 2024-06-02 NOTE — Telephone Encounter (Signed)
 Spoke with Vernell at [facility] and informed her.

## 2024-06-02 NOTE — Telephone Encounter (Signed)
 Spoke with Erminio at [facility]. Therisa was gone for the day. I got everything squared away with the Vit B12 and Folic Acid . Erminio did want to clarify if the Allegra  is supposed to be PRN or on a scheduled daily basis? The facility that Mr Jay Rivas is at needed to know. Please advise.

## 2024-06-09 ENCOUNTER — Telehealth: Payer: Self-pay

## 2024-06-09 NOTE — Telephone Encounter (Signed)
 Copied from CRM #[id number]. Topic: Clinical - Medication Question >> Jun 09, 2024  2:39 PM Tobias CROME wrote: Reason for CRM: Erminio w/ [facility] pharmacy requesting clarification for allegra . Nursing home advised pharmacy patient is taking the allegra  PRN but the pharmacy has a script for one tablet one daily.   Requesting callback: [phone]

## 2024-06-10 ENCOUNTER — Other Ambulatory Visit: Payer: Self-pay

## 2024-06-10 DIAGNOSIS — J302 Other seasonal allergic rhinitis: Secondary | ICD-10-CM

## 2024-06-10 MED ORDER — FEXOFENADINE HCL 180 MG PO TABS: 180.0000 mg | ORAL_TABLET | Freq: Every day | ORAL | 3 refills | Status: AC | PRN

## 2024-06-13 ENCOUNTER — Telehealth: Payer: Self-pay

## 2024-06-13 NOTE — Telephone Encounter (Signed)
 LM on identified voicemail, advising Hadassah of verbal orders. Mjp,lpn  Copied from CRM #[id number]. Topic: Clinical - Home Health Verbal Orders >> Jun 13, 2024  9:14 AM Charlet HERO wrote: Caller/Agency: Earnie PT/ [facility] Callback Number: [phone] Service Requested: Physical Therapy Frequency: 1w1/ 2w3/ 1w5 Any new concerns about the patient? No

## 2024-07-11 ENCOUNTER — Telehealth: Payer: Self-pay | Admitting: Family Medicine

## 2024-07-11 ENCOUNTER — Telehealth: Payer: Self-pay

## 2024-07-11 NOTE — Telephone Encounter (Signed)
 Copied from CRM [id number]. Topic: General - Other >> Jul 11, 2024  1:34 PM Dedra B wrote: Reason for CRM: Sonny Sake, nurse at [facility] called to give Ronal Bradley pt BP readings. 9/1 113/71 HR 54, 9/2 104/59 Hr 62, 9/3 126/68 HR 61, 9/4 107/42 HR 58, 9/5 105/43 HR 60,  9/13 120 76/51 HR 63, 9/13 recheck 96/62, 9/14 126/74 HR 59, 9/15 104/51 HR 65. She also sent a copy to pt son. Pls call Sonny at [phone].

## 2024-07-11 NOTE — Telephone Encounter (Signed)
 Copied from CRM [id number]. Topic: Clinical - Medical Advice >> Jul 11, 2024  9:52 AM Sophia H wrote: Reason for CRM: Son is calling in on behalf of the patient, states patients BP was 90/60 a few days ago. Patient is really lethargic and out of it per son when BP is low. States was put on a seizure medication 2 months ago and wondering if it has anything to do with it. Patient is  currently in a nursing home, son is wanting to know if we can call RN and advise of what can be done to try and help patient since he is not there, he is about 2 hours out.  RN Sonny (Owner of nursing home) [phone], verbal orders can be given if needed. Son states it will be a little hard to bring the patient in given health issues.    If any questions for son please call # [phone]

## 2024-07-12 ENCOUNTER — Other Ambulatory Visit: Payer: Self-pay

## 2024-07-12 DIAGNOSIS — I10 Essential (primary) hypertension: Secondary | ICD-10-CM

## 2024-07-12 MED ORDER — LOSARTAN POTASSIUM 25 MG PO TABS: 25.0000 mg | ORAL_TABLET | Freq: Every day | ORAL | 3 refills | Status: DC

## 2024-07-18 DIAGNOSIS — M65311 Trigger thumb, right thumb: Secondary | ICD-10-CM

## 2024-07-18 DIAGNOSIS — G9341 Metabolic encephalopathy: Secondary | ICD-10-CM

## 2024-07-18 DIAGNOSIS — F02811 Dementia in other diseases classified elsewhere, unspecified severity, with agitation: Secondary | ICD-10-CM | POA: Diagnosis not present

## 2024-07-18 DIAGNOSIS — D509 Iron deficiency anemia, unspecified: Secondary | ICD-10-CM

## 2024-07-18 DIAGNOSIS — R32 Unspecified urinary incontinence: Secondary | ICD-10-CM

## 2024-07-18 DIAGNOSIS — E785 Hyperlipidemia, unspecified: Secondary | ICD-10-CM

## 2024-07-18 DIAGNOSIS — G5600 Carpal tunnel syndrome, unspecified upper limb: Secondary | ICD-10-CM

## 2024-07-18 DIAGNOSIS — I2510 Atherosclerotic heart disease of native coronary artery without angina pectoris: Secondary | ICD-10-CM

## 2024-07-18 DIAGNOSIS — F0284 Dementia in other diseases classified elsewhere, unspecified severity, with anxiety: Secondary | ICD-10-CM | POA: Diagnosis not present

## 2024-07-18 DIAGNOSIS — G20B1 Parkinson's disease with dyskinesia, without mention of fluctuations: Secondary | ICD-10-CM | POA: Diagnosis not present

## 2024-07-18 DIAGNOSIS — I10 Essential (primary) hypertension: Secondary | ICD-10-CM

## 2024-07-18 DIAGNOSIS — G3189 Other specified degenerative diseases of nervous system: Secondary | ICD-10-CM | POA: Diagnosis not present

## 2024-07-25 ENCOUNTER — Encounter (HOSPITAL_COMMUNITY): Payer: Self-pay | Admitting: Emergency Medicine

## 2024-07-25 ENCOUNTER — Inpatient Hospital Stay (HOSPITAL_COMMUNITY)
Admission: EM | Admit: 2024-07-25 | Discharge: 2024-07-29 | DRG: 871 | Disposition: A | Discharge status: Home Health | Source: Skilled Nursing Facility | Attending: Family Medicine | Admitting: Family Medicine

## 2024-07-25 ENCOUNTER — Emergency Department (HOSPITAL_COMMUNITY)

## 2024-07-25 ENCOUNTER — Other Ambulatory Visit: Payer: Self-pay

## 2024-07-25 DIAGNOSIS — I959 Hypotension, unspecified: Secondary | ICD-10-CM | POA: Diagnosis present

## 2024-07-25 DIAGNOSIS — G40909 Epilepsy, unspecified, not intractable, without status epilepticus: Secondary | ICD-10-CM | POA: Diagnosis present

## 2024-07-25 DIAGNOSIS — J9601 Acute respiratory failure with hypoxia: Secondary | ICD-10-CM | POA: Diagnosis not present

## 2024-07-25 DIAGNOSIS — Z66 Do not resuscitate: Secondary | ICD-10-CM | POA: Diagnosis not present

## 2024-07-25 DIAGNOSIS — A419 Sepsis, unspecified organism: Principal | ICD-10-CM | POA: Diagnosis present

## 2024-07-25 DIAGNOSIS — E876 Hypokalemia: Secondary | ICD-10-CM | POA: Diagnosis not present

## 2024-07-25 DIAGNOSIS — J690 Pneumonitis due to inhalation of food and vomit: Secondary | ICD-10-CM | POA: Diagnosis present

## 2024-07-25 DIAGNOSIS — G928 Other toxic encephalopathy: Secondary | ICD-10-CM | POA: Diagnosis present

## 2024-07-25 DIAGNOSIS — Z87891 Personal history of nicotine dependence: Secondary | ICD-10-CM

## 2024-07-25 DIAGNOSIS — F0280 Dementia in other diseases classified elsewhere without behavioral disturbance: Secondary | ICD-10-CM | POA: Diagnosis present

## 2024-07-25 DIAGNOSIS — J189 Pneumonia, unspecified organism: Secondary | ICD-10-CM | POA: Diagnosis present

## 2024-07-25 DIAGNOSIS — G20A1 Parkinson's disease without dyskinesia, without mention of fluctuations: Secondary | ICD-10-CM | POA: Diagnosis present

## 2024-07-25 DIAGNOSIS — R4182 Altered mental status, unspecified: Secondary | ICD-10-CM | POA: Diagnosis not present

## 2024-07-25 DIAGNOSIS — E8809 Other disorders of plasma-protein metabolism, not elsewhere classified: Secondary | ICD-10-CM | POA: Diagnosis present

## 2024-07-25 DIAGNOSIS — L8990 Pressure ulcer of unspecified site, unspecified stage: Secondary | ICD-10-CM | POA: Insufficient documentation

## 2024-07-25 DIAGNOSIS — Z1152 Encounter for screening for COVID-19: Secondary | ICD-10-CM | POA: Diagnosis not present

## 2024-07-25 DIAGNOSIS — D509 Iron deficiency anemia, unspecified: Secondary | ICD-10-CM | POA: Diagnosis present

## 2024-07-25 DIAGNOSIS — Z79899 Other long term (current) drug therapy: Secondary | ICD-10-CM

## 2024-07-25 DIAGNOSIS — Z7189 Other specified counseling: Secondary | ICD-10-CM

## 2024-07-25 DIAGNOSIS — I9589 Other hypotension: Secondary | ICD-10-CM | POA: Diagnosis not present

## 2024-07-25 DIAGNOSIS — L89151 Pressure ulcer of sacral region, stage 1: Secondary | ICD-10-CM | POA: Diagnosis present

## 2024-07-25 DIAGNOSIS — N390 Urinary tract infection, site not specified: Secondary | ICD-10-CM | POA: Diagnosis present

## 2024-07-25 DIAGNOSIS — Z515 Encounter for palliative care: Secondary | ICD-10-CM

## 2024-07-25 DIAGNOSIS — R739 Hyperglycemia, unspecified: Secondary | ICD-10-CM | POA: Diagnosis present

## 2024-07-25 DIAGNOSIS — G3185 Corticobasal degeneration: Secondary | ICD-10-CM | POA: Diagnosis present

## 2024-07-25 DIAGNOSIS — R1310 Dysphagia, unspecified: Secondary | ICD-10-CM | POA: Diagnosis present

## 2024-07-25 DIAGNOSIS — R6520 Severe sepsis without septic shock: Secondary | ICD-10-CM

## 2024-07-25 DIAGNOSIS — I252 Old myocardial infarction: Secondary | ICD-10-CM

## 2024-07-25 DIAGNOSIS — R64 Cachexia: Secondary | ICD-10-CM | POA: Diagnosis present

## 2024-07-25 DIAGNOSIS — Z6820 Body mass index (BMI) 20.0-20.9, adult: Secondary | ICD-10-CM

## 2024-07-25 DIAGNOSIS — E785 Hyperlipidemia, unspecified: Secondary | ICD-10-CM | POA: Diagnosis present

## 2024-07-25 DIAGNOSIS — Z7982 Long term (current) use of aspirin: Secondary | ICD-10-CM

## 2024-07-25 DIAGNOSIS — J181 Lobar pneumonia, unspecified organism: Secondary | ICD-10-CM | POA: Diagnosis not present

## 2024-07-25 DIAGNOSIS — Z789 Other specified health status: Secondary | ICD-10-CM | POA: Diagnosis not present

## 2024-07-25 DIAGNOSIS — G9341 Metabolic encephalopathy: Secondary | ICD-10-CM | POA: Diagnosis present

## 2024-07-25 DIAGNOSIS — Z880 Allergy status to penicillin: Secondary | ICD-10-CM

## 2024-07-25 DIAGNOSIS — R509 Fever, unspecified: Secondary | ICD-10-CM | POA: Diagnosis present

## 2024-07-25 DIAGNOSIS — I10 Essential (primary) hypertension: Secondary | ICD-10-CM | POA: Diagnosis present

## 2024-07-25 DIAGNOSIS — G253 Myoclonus: Secondary | ICD-10-CM | POA: Diagnosis present

## 2024-07-25 DIAGNOSIS — R569 Unspecified convulsions: Secondary | ICD-10-CM

## 2024-07-25 DIAGNOSIS — M4802 Spinal stenosis, cervical region: Secondary | ICD-10-CM | POA: Diagnosis present

## 2024-07-25 DIAGNOSIS — G20C Parkinsonism, unspecified: Secondary | ICD-10-CM | POA: Diagnosis present

## 2024-07-25 LAB — RESP PANEL BY RT-PCR (RSV, FLU A&B, COVID)  RVPGX2
Influenza A by PCR: NEGATIVE
Influenza B by PCR: NEGATIVE
Resp Syncytial Virus by PCR: NEGATIVE
SARS Coronavirus 2 by RT PCR: NEGATIVE

## 2024-07-25 LAB — URINALYSIS, W/ REFLEX TO CULTURE (INFECTION SUSPECTED)
Bilirubin Urine: NEGATIVE
Glucose, UA: NEGATIVE mg/dL
Ketones, ur: NEGATIVE mg/dL
Nitrite: NEGATIVE
Protein, ur: 30 mg/dL — AB
Specific Gravity, Urine: 1.018 (ref 1.005–1.030)
Trans Epithel, UA: 1
pH: 5 (ref 5.0–8.0)

## 2024-07-25 LAB — CBC WITH DIFFERENTIAL/PLATELET
Abs Immature Granulocytes: 0.01 K/uL (ref 0.00–0.07)
Basophils Absolute: 0 K/uL (ref 0.0–0.1)
Basophils Relative: 1 %
Eosinophils Absolute: 0.1 K/uL (ref 0.0–0.5)
Eosinophils Relative: 1 %
HCT: 32.1 % — ABNORMAL LOW (ref 39.0–52.0)
Hemoglobin: 11 g/dL — ABNORMAL LOW (ref 13.0–17.0)
Immature Granulocytes: 0 %
Lymphocytes Relative: 9 %
Lymphs Abs: 0.6 K/uL — ABNORMAL LOW (ref 0.7–4.0)
MCH: 31.3 pg (ref 26.0–34.0)
MCHC: 34.3 g/dL (ref 30.0–36.0)
MCV: 91.5 fL (ref 80.0–100.0)
Monocytes Absolute: 0.8 K/uL (ref 0.1–1.0)
Monocytes Relative: 12 %
Neutro Abs: 5.2 K/uL (ref 1.7–7.7)
Neutrophils Relative %: 77 %
Platelets: 143 K/uL — ABNORMAL LOW (ref 150–400)
RBC: 3.51 MIL/uL — ABNORMAL LOW (ref 4.22–5.81)
RDW: 12.3 % (ref 11.5–15.5)
WBC: 6.8 K/uL (ref 4.0–10.5)
nRBC: 0 % (ref 0.0–0.2)

## 2024-07-25 LAB — PROTIME-INR
INR: 1.1 (ref 0.8–1.2)
Prothrombin Time: 14.4 s (ref 11.4–15.2)

## 2024-07-25 LAB — COMPREHENSIVE METABOLIC PANEL WITH GFR
ALT: 34 U/L (ref 0–44)
AST: 21 U/L (ref 15–41)
Albumin: 3.4 g/dL — ABNORMAL LOW (ref 3.5–5.0)
Alkaline Phosphatase: 55 U/L (ref 38–126)
Anion gap: 15 (ref 5–15)
BUN: 17 mg/dL (ref 8–23)
CO2: 24 mmol/L (ref 22–32)
Calcium: 8.6 mg/dL — ABNORMAL LOW (ref 8.9–10.3)
Chloride: 96 mmol/L — ABNORMAL LOW (ref 98–111)
Creatinine, Ser: 1.15 mg/dL (ref 0.61–1.24)
GFR, Estimated: >60 mL/min (ref >60)
Glucose, Bld: 107 mg/dL — ABNORMAL HIGH (ref 70–99)
Potassium: 3.8 mmol/L (ref 3.5–5.1)
Sodium: 135 mmol/L (ref 135–145)
Total Bilirubin: 1.2 mg/dL (ref 0.0–1.2)
Total Protein: 6.5 g/dL (ref 6.5–8.1)

## 2024-07-25 LAB — CORTISOL: Cortisol, Plasma: 13.9 ug/dL

## 2024-07-25 LAB — LACTIC ACID, PLASMA
Lactic Acid, Venous: 1 mmol/L (ref 0.5–1.9)
Lactic Acid, Venous: 1.1 mmol/L (ref 0.5–1.9)

## 2024-07-25 LAB — PHOSPHORUS: Phosphorus: 3.8 mg/dL (ref 2.5–4.6)

## 2024-07-25 LAB — MAGNESIUM: Magnesium: 2 mg/dL (ref 1.7–2.4)

## 2024-07-25 LAB — MRSA NEXT GEN BY PCR, NASAL: MRSA by PCR Next Gen: NOT DETECTED

## 2024-07-25 MED ORDER — BUDESONIDE 0.5 MG/2ML IN SUSP
0.5000 mg | Freq: Two times a day (BID) | RESPIRATORY_TRACT | Status: DC
Start: 2024-07-25 — End: 2024-07-29
  Administered 2024-07-25 – 2024-07-29 (×9): 0.5 mg via RESPIRATORY_TRACT
  Filled 2024-07-25 (×10): qty 2

## 2024-07-25 MED ORDER — IPRATROPIUM-ALBUTEROL 0.5-2.5 (3) MG/3ML IN SOLN: 3.0000 mL | Freq: Four times a day (QID) | RESPIRATORY_TRACT | Status: DC | PRN

## 2024-07-25 MED ORDER — ACETAMINOPHEN 650 MG RE SUPP
650.0000 mg | Freq: Four times a day (QID) | RECTAL | Status: DC | PRN
  Administered 2024-07-26: 650 mg via RECTAL
  Filled 2024-07-25: qty 1

## 2024-07-25 MED ORDER — LEVOFLOXACIN IN D5W 750 MG/150ML IV SOLN
750.0000 mg | INTRAVENOUS | Status: DC
  Administered 2024-07-26 – 2024-07-29 (×4): 750 mg via INTRAVENOUS
  Filled 2024-07-25 (×4): qty 150

## 2024-07-25 MED ORDER — ASPIRIN 81 MG PO TBEC
81.0000 mg | DELAYED_RELEASE_TABLET | Freq: Every day | ORAL | Status: DC
Start: 2024-07-25 — End: 2024-07-25
  Filled 2024-07-25: qty 1

## 2024-07-25 MED ORDER — MIDODRINE HCL 5 MG PO TABS
2.5000 mg | ORAL_TABLET | Freq: Three times a day (TID) | ORAL | Status: DC
  Administered 2024-07-26 – 2024-07-27 (×2): 2.5 mg via ORAL
  Filled 2024-07-25 (×3): qty 1

## 2024-07-25 MED ORDER — DM-GUAIFENESIN ER 30-600 MG PO TB12
1.0000 | ORAL_TABLET | Freq: Two times a day (BID) | ORAL | Status: DC
  Administered 2024-07-28 – 2024-07-29 (×2): 1 via ORAL
  Filled 2024-07-25 (×4): qty 1

## 2024-07-25 MED ORDER — LACTATED RINGERS IV BOLUS (SEPSIS)
1000.0000 mL | Freq: Once | INTRAVENOUS | Status: AC
  Administered 2024-07-25: 1000 mL via INTRAVENOUS

## 2024-07-25 MED ORDER — ACETAMINOPHEN 325 MG PO TABS
650.0000 mg | ORAL_TABLET | Freq: Four times a day (QID) | ORAL | Status: DC | PRN
  Filled 2024-07-25: qty 2

## 2024-07-25 MED ORDER — VALPROATE SODIUM 100 MG/ML IV SOLN
125.0000 mg | Freq: Two times a day (BID) | INTRAVENOUS | Status: DC
  Administered 2024-07-25 – 2024-07-28 (×6): 125 mg via INTRAVENOUS
  Filled 2024-07-25 (×8): qty 1.25

## 2024-07-25 MED ORDER — ACETAMINOPHEN 650 MG RE SUPP
650.0000 mg | Freq: Once | RECTAL | Status: AC
  Administered 2024-07-25: 650 mg via RECTAL
  Filled 2024-07-25: qty 1

## 2024-07-25 MED ORDER — ONDANSETRON HCL 4 MG/2ML IJ SOLN: 4.0000 mg | Freq: Four times a day (QID) | INTRAMUSCULAR | Status: DC | PRN

## 2024-07-25 MED ORDER — LEVOFLOXACIN IN D5W 750 MG/150ML IV SOLN
750.0000 mg | Freq: Once | INTRAVENOUS | Status: AC
  Administered 2024-07-25: 750 mg via INTRAVENOUS
  Filled 2024-07-25: qty 150

## 2024-07-25 MED ORDER — ENOXAPARIN SODIUM 40 MG/0.4ML IJ SOSY
40.0000 mg | PREFILLED_SYRINGE | INTRAMUSCULAR | Status: DC
  Administered 2024-07-25 – 2024-07-26 (×2): 40 mg via SUBCUTANEOUS
  Filled 2024-07-25 (×2): qty 0.4

## 2024-07-25 MED ORDER — ACETAMINOPHEN 500 MG PO TABS: 1000.0000 mg | ORAL_TABLET | Freq: Once | ORAL | Status: DC

## 2024-07-25 MED ORDER — FOLIC ACID 1 MG PO TABS
1.0000 mg | ORAL_TABLET | Freq: Every day | ORAL | Status: DC
  Filled 2024-07-25: qty 1

## 2024-07-25 MED ORDER — DONEPEZIL HCL 5 MG PO TABS: 10.0000 mg | ORAL_TABLET | Freq: Every day | ORAL | Status: DC

## 2024-07-25 MED ORDER — ONDANSETRON HCL 4 MG PO TABS: 4.0000 mg | ORAL_TABLET | Freq: Four times a day (QID) | ORAL | Status: DC | PRN

## 2024-07-25 MED ORDER — ORAL CARE MOUTH RINSE: 15.0000 mL | OROMUCOSAL | Status: DC | PRN

## 2024-07-25 MED ORDER — LACTATED RINGERS IV SOLN: INTRAVENOUS | Status: AC

## 2024-07-25 MED ORDER — FOLIC ACID 5 MG/ML IJ SOLN
1.0000 mg | Freq: Every day | INTRAMUSCULAR | Status: DC
  Administered 2024-07-25 – 2024-07-28 (×4): 1 mg via INTRAVENOUS
  Filled 2024-07-25 (×5): qty 0.2

## 2024-07-25 MED ORDER — ACETAMINOPHEN 10 MG/ML IV SOLN
1000.0000 mg | Freq: Once | INTRAVENOUS | Status: AC
Start: 2024-07-25 — End: 2024-07-25
  Administered 2024-07-25: 1000 mg via INTRAVENOUS
  Filled 2024-07-25: qty 100

## 2024-07-25 MED ORDER — DIVALPROEX SODIUM 250 MG PO DR TAB
250.0000 mg | DELAYED_RELEASE_TABLET | Freq: Two times a day (BID) | ORAL | Status: DC
  Filled 2024-07-25: qty 1

## 2024-07-25 NOTE — Sepsis Progress Note (Signed)
 Sepsis protocol monitored by eLink

## 2024-07-25 NOTE — Progress Notes (Signed)
 Patient was at bedside with his Wife when RN noticed patient was having full body seizure like activity. The Duress button was pushed by the RN and the charge RN, Deidre came to bedside to assist. Patient was having seizure like activity for at least one minute and remained in Afib RVR. EKG obtained. Patient has only be oriented to self since admission and has been unable to follow simple commands. MD was notified and no orders were placed at the time. Wife mentioned that he had a seizure at the nursing home about a month ago while he was eating breakfast and he was placed on Depakote after that incident. MD made aware. Nursing will continue to closely monitor the patient. Seizure pads were placed on the bed. Floor mats in place. Patient remains on 2L [place]. Suction is setup as well.

## 2024-07-25 NOTE — H&P (Signed)
 History and Physical    Patient: Jay Rivas FMW:[id number] DOB: May 06, 1951 DOA: 07/25/2024 DOS: the patient was seen and examined on 07/25/2024 PCP: Duanne Butler DASEN, MD   Patient coming from: SNF  Chief Complaint:  Chief Complaint  Patient presents with   Fever   HPI: Jay Rivas is a 73 y.o. male with medical history significant of dementia, Parkinson disease, hypertension, hyperlipidemia, GERD and seasonal rhinitis; who presented to the hospital from nursing home secondary to fever, general malaise and productive cough.  Symptoms have been present for the last 2 to 3 days and worsening.  At time of arrival to the ED patient requiring 2 L supplementation to maintain saturation (do not use oxygen at baseline) patient was febrile, tachypneic and tachycardic.  Urinalysis with concerns for the presence of a UTI and a chest x-ray not demonstrating acute infiltrates.  IV fluids provided, cultures taken and IV antibiotics initiated.  Patient received antipyretics and oxygen supplementation.  TRH has been contacted to place patient in the hospital for further evaluation and management.  Of note, there had not been any chest pain, abdominal pain, focal neurologic deficits, hematuria/melena, hematochezia, hematemesis or complaints of abdominal pain.  Per family reports patient has been started on antiseizure medications about 1 month ago after having sudden seizure-like activities.  Respiratory panel by PCR demonstrated to be negative for COVID, influenza and RSV.  Review of Systems: As mentioned in the history of present illness. All other systems reviewed and are negative. Past Medical History:  Diagnosis Date   Carpal tunnel syndrome on both sides    Dementia (HCC)    diagnosed in jan 2019, pt states medication seems to be helping, A&Ox4   HLD (hyperlipidemia)    Hypertension    Memory disorder 11/24/2017   Myocardial infarction [facility])    Dr. Levern   Spinal stenosis of cervical  region    Past Surgical History:  Procedure Laterality Date   ABDOMINAL SURGERY     CARDIAC CATHETERIZATION  2005   stent   CARPAL TUNNEL RELEASE Right    CARPAL TUNNEL RELEASE Right 03/09/2018   Procedure: RIGHT CARPAL TUNNEL RELEASE;  Surgeon: Murrell Arley, MD;  Location: [facility];  Service: Orthopedics;  Laterality: Right;   CARPAL TUNNEL RELEASE Left 11/02/2018   Procedure: LEFT CARPAL TUNNEL RELEASE;  Surgeon: Murrell Arley, MD;  Location: [facility];  Service: Orthopedics;  Laterality: Left;   TENDON TRANSFER Left 11/02/2018   Procedure: LEFT CAMITZ TRANSFER;  Surgeon: Murrell Arley, MD;  Location: [facility];  Service: Orthopedics;  Laterality: Left;   TRIGGER FINGER RELEASE Right 03/09/2018   Procedure: RELEASE TRIGGER FINGER/A-1 PULLEY RIGHT MIDDLE, RING AND SMALL FINGERS;  Surgeon: Murrell Arley, MD;  Location: [facility];  Service: Orthopedics;  Laterality: Right;   ULNAR NERVE TRANSPOSITION Left 11/02/2018   Procedure: LEFT ULNAR NERVE DECOMPRESSION;  Surgeon: Murrell Arley, MD;  Location: [facility];  Service: Orthopedics;  Laterality: Left;   ULNAR TUNNEL RELEASE Right 03/09/2018   Procedure: CUBITAL TUNNEL DECOMPRESSION RIGHT ELBOW;  Surgeon: Murrell Arley, MD;  Location: [facility];  Service: Orthopedics;  Laterality: Right;   Social History:  reports that he has quit smoking. He has been exposed to tobacco smoke. He has quit using smokeless tobacco. He reports that he does not currently use alcohol after a past usage of about 24.0 standard drinks of alcohol per week. He reports that he does not use  drugs.  Allergies  Allergen Reactions   Penicillin G Anaphylaxis    History reviewed. No pertinent family history.  Prior to Admission medications   Medication Sig Start Date End Date Taking? Authorizing Provider  aspirin  EC 81 MG tablet Take 1 tablet (81 mg total) by mouth daily. 05/12/24  Yes Duanne Butler DASEN, MD  Cholecalciferol (QC VITAMIN D3) 25 MCG (1000 UT) tablet Take 1 tablet (1,000 Units total) by mouth daily. 05/12/24  Yes Duanne Butler DASEN, MD  cyanocobalamin  (VITAMIN B12) 500 MCG tablet Take 1 tablet (500 mcg total) by mouth daily. 06/02/24  Yes Duanne Butler DASEN, MD  divalproex (DEPAKOTE) 250 MG DR tablet Take 250 mg by mouth 2 (two) times daily. 06/28/24  Yes [provider]  donepezil  (ARICEPT ) 10 MG tablet TAKE 1 TABLET BY MOUTH EVERYDAY AT BEDTIME 05/12/24  Yes Duanne Butler DASEN, MD  fexofenadine  (ALLEGRA ) 180 MG tablet Take 1 tablet (180 mg total) by mouth daily as needed for allergies or rhinitis. 06/10/24  Yes Duanne Butler DASEN, MD  folic acid  (FOLVITE ) 1 MG tablet Take 1 tablet (1 mg total) by mouth daily. 06/02/24  Yes Duanne Butler DASEN, MD  Psyllium (METAMUCIL) 0.36 g CAPS Take 0.36 g by mouth daily. 05/12/24  Yes Duanne Butler DASEN, MD  Vitamin D-Vitamin K (VITAMIN K2 -VITAMIN D3) 90-125 MCG CAPS Take 1 capsule by mouth daily. 05/13/24  Yes Duanne Butler DASEN, MD  losartan  (COZAAR ) 25 MG tablet Take 1 tablet (25 mg total) by mouth daily. Patient not taking: Reported on 07/25/2024 07/12/24   Duanne Butler DASEN, MD    Physical Exam: Vitals:   07/25/24 1330 07/25/24 1345 07/25/24 1354 07/25/24 1553  BP: 95/65 97/66  134/88  Pulse: (!) 108 96  96  Resp: (!) 23 (!) 21  (!) 23  Temp:    (!) 100.7 F (38.2 C)  TempSrc:    Axillary  SpO2: 94%  95% 95%  Weight:    66.6 kg  Height:    5' 8 (1.727 m)   General exam: Alert, awake, able to answer simple questions,  Respiratory system: Diffuse rhonchi bilaterally; no wheezing or crackles on exam.  Positive tachypnea.  2 L nasal cannula in place. Cardiovascular system: Sinus tachycardia, no rubs, no gallops, no JVD. Gastrointestinal system: Abdomen is nondistended, soft and nontender.  Positive bowel sounds. Central nervous system: No focal neurological deficits.  Positive upper extremity rigidity/spasticity appreciated on exam  (most likely characteristic of underlying diagnosis of Parkinson). Extremities: No cyanosis or clubbing. Skin: No petechiae. Psychiatry: Mood & affect appropriate.   Data Reviewed: Lactic acid: 1.0 CBC:WBC 6.8, hemoglobin 11.0 platelet count 1 43K Comprehensive metabolic panel: Sodium 135, potassium 3.8, chloride 96, bicarb 24, BUN 17, creatinine 1.15, normal LFTs and GFR >60  Assessment and Plan: 1-sepsis: Present at time of admission - Appears to be in the setting of pneumonia (most likely aspiration with underlying history of Parkinson) and UTI - Patient with underlying history of penicillin allergy - Started on Levaquin  - Follow culture results - Bronchodilator management and oxygen supplementation will be provided - Continue aggressive fluid resuscitation and follow clinical response - As needed antiemetics will be provided.  2-difficulty swallowing - Patient most likely with underlying swallowing difficulties with underlying history of Parkinson - Speech therapy has been contacted - N.p.o. for now - Medications will be held as much as possible and others transition to IV route - Follow response.  3-history of dementia -Holding patient Aricept  in the setting of  difficulty swallowing - Follow speech therapy recommendations for adjustments to patient's diet consistency - Continue supportive care and constant reorientation.  4-presumed seizure disorder - Patient's Depakote transition to IV route and adjusted dose for now -Continue supportive care, adequate hydration and follow response. - Planning to resume oral medications when able to take by mouth.  5-hypertension - Patient with soft blood pressure at time of admission; antihypertensive agents currently on hold. - Low-dose midodrine if able to take by mouth will be started.  6-seasonal allergies - Atrovent alone DuoNebs to be provided as needed - Holding Allegra  in the setting of decreased swallowing  capabilities.  7-DNR/DNI - Patient at high risk for decompensation - Wishes will be respected - Palliative care has been consulted for further discussion regarding advance care planning.    Advance Care Planning:   Code Status: Limited: Do not attempt resuscitation (DNR) -DNR-LIMITED -Do Not Intubate/DNI    Consults: Palliative care  Family Communication: No family at bedside at time of evaluation.  Severity of Illness: The appropriate patient status for this patient is INPATIENT. Inpatient status is judged to be reasonable and necessary in order to provide the required intensity of service to ensure the patient's safety. The patient's presenting symptoms, physical exam findings, and initial radiographic and laboratory data in the context of their chronic comorbidities is felt to place them at high risk for further clinical deterioration. Furthermore, it is not anticipated that the patient will be medically stable for discharge from the hospital within 2 midnights of admission.   * I certify that at the point of admission it is my clinical judgment that the patient will require inpatient hospital care spanning beyond 2 midnights from the point of admission due to high intensity of service, high risk for further deterioration and high frequency of surveillance required.*  Author: Eric Nunnery, MD 07/25/2024 5:56 PM  For on call review [email or web address].

## 2024-07-25 NOTE — ED Provider Notes (Signed)
 [place] EMERGENCY DEPARTMENT AT [facility] Provider Note   CSN: [id number] Arrival date & time: 07/25/24  1013     Patient presents with: Fever   Jay Rivas is a 73 y.o. male.   Patient is a 73 year old male who presents to the emergency department secondary to increased malaise and fatigue, congestion, cough for the past 2 days.  He does present with a caregiver from his facility.  He has continued to have good p.o. intake over the past 24 hours but has not been as active today.  Patient has had no nausea, vomiting, diarrhea.  He denies any active chest pain or shortness of breath.  He was hypoxic on initial presentation and placed on 2 L nasal cannula.   Fever Associated symptoms: cough        Prior to Admission medications   Medication Sig Start Date End Date Taking? Authorizing Provider  aspirin  EC 81 MG tablet Take 1 tablet (81 mg total) by mouth daily. 05/12/24   Duanne Butler DASEN, MD  Cholecalciferol (QC VITAMIN D3) 25 MCG (1000 UT) tablet Take 1 tablet (1,000 Units total) by mouth daily. 05/12/24   Duanne Butler DASEN, MD  cyanocobalamin  (VITAMIN B12) 500 MCG tablet Take 1 tablet (500 mcg total) by mouth daily. 06/02/24   Duanne Butler DASEN, MD  divalproex (DEPAKOTE) 250 MG DR tablet Take 250 mg by mouth 2 (two) times daily. 06/28/24   [provider]  donepezil  (ARICEPT ) 10 MG tablet TAKE 1 TABLET BY MOUTH EVERYDAY AT BEDTIME 05/12/24   Duanne Butler DASEN, MD  fexofenadine  (ALLEGRA ) 180 MG tablet Take 1 tablet (180 mg total) by mouth daily as needed for allergies or rhinitis. 06/10/24   Duanne Butler DASEN, MD  folic acid  (FOLVITE ) 1 MG tablet Take 1 tablet (1 mg total) by mouth daily. 06/02/24   Duanne Butler DASEN, MD  losartan  (COZAAR ) 25 MG tablet Take 1 tablet (25 mg total) by mouth daily. 07/12/24   Duanne Butler DASEN, MD  Psyllium (METAMUCIL) 0.36 g CAPS Take 0.36 g by mouth daily. 05/12/24   Duanne Butler DASEN, MD  Vitamin D-Vitamin K (VITAMIN K2 -VITAMIN D3)  90-125 MCG CAPS Take 1 capsule by mouth daily. 05/13/24   Duanne Butler DASEN, MD    Allergies: Penicillin g    Review of Systems  Constitutional:  Positive for fever.  Respiratory:  Positive for cough.   All other systems reviewed and are negative.   Updated Vital Signs BP (!) 97/41   Pulse (!) 108   Temp (!) 102.6 F (39.2 C) (Rectal)   Resp (!) 24   Ht 5' 8 (1.727 m)   Wt 62 kg   SpO2 94%   BMI 20.78 kg/m   Physical Exam Vitals and nursing note reviewed.  Constitutional:      Appearance: Normal appearance.     Comments: Cachectic  HENT:     Head: Normocephalic and atraumatic.     Nose: Nose normal.     Mouth/Throat:     Mouth: Mucous membranes are moist.  Eyes:     Extraocular Movements: Extraocular movements intact.     Conjunctiva/sclera: Conjunctivae normal.     Pupils: Pupils are equal, round, and reactive to light.  Cardiovascular:     Rate and Rhythm: Normal rate and regular rhythm.     Pulses: Normal pulses.     Heart sounds: Normal heart sounds. No murmur heard.    No gallop.  Pulmonary:     Effort:  Pulmonary effort is normal.     Breath sounds: Rhonchi and rales present.  Abdominal:     General: Abdomen is flat. Bowel sounds are normal. There is no distension.     Palpations: Abdomen is soft.     Tenderness: There is no abdominal tenderness. There is no guarding.  Musculoskeletal:        General: Normal range of motion.     Cervical back: Normal range of motion and neck supple. No rigidity or tenderness.     Right lower leg: No edema.     Left lower leg: No edema.  Skin:    General: Skin is warm and dry.     Findings: No bruising or rash.  Neurological:     General: No focal deficit present.     Mental Status: He is alert. Mental status is at baseline.  Psychiatric:        Mood and Affect: Mood normal.        Behavior: Behavior normal.        Thought Content: Thought content normal.        Judgment: Judgment normal.     (all labs ordered  are listed, but only abnormal results are displayed) Labs Reviewed  COMPREHENSIVE METABOLIC PANEL WITH GFR - Abnormal; Notable for the following components:      Result Value   Chloride 96 (*)    Glucose, Bld 107 (*)    Calcium  8.6 (*)    Albumin 3.4 (*)    All other components within normal limits  CBC WITH DIFFERENTIAL/PLATELET - Abnormal; Notable for the following components:   RBC 3.51 (*)    Hemoglobin 11.0 (*)    HCT 32.1 (*)    Platelets 143 (*)    Lymphs Abs 0.6 (*)    All other components within normal limits  URINALYSIS, W/ REFLEX TO CULTURE (INFECTION SUSPECTED) - Abnormal; Notable for the following components:   Hgb urine dipstick LARGE (*)    Protein, ur 30 (*)    Leukocytes,Ua TRACE (*)    Bacteria, UA RARE (*)    All other components within normal limits  RESP PANEL BY RT-PCR (RSV, FLU A&B, COVID)  RVPGX2  CULTURE, BLOOD (ROUTINE X 2)  CULTURE, BLOOD (ROUTINE X 2)  URINE CULTURE  LACTIC ACID, PLASMA  PROTIME-INR  LACTIC ACID, PLASMA    EKG: EKG Interpretation Date/Time:  Monday July 25 2024 12:11:40 EDT Ventricular Rate:  132 PR Interval:  174 QRS Duration:  90 QT Interval:  337 QTC Calculation: 500 R Axis:   81  Text Interpretation: Atrial fibrillation Borderline right axis deviation Minimal ST depression, inferior leads Borderline prolonged QT interval afib new since earlier in the day Confirmed by Freddi Hamilton [phone]) on 07/25/2024 12:18:16 PM  Radiology: [facility] Chest Port 1 View Result Date: 07/25/2024 CLINICAL DATA:  Possible sepsis. Congestion with fever and cough 2 days. EXAM: PORTABLE CHEST 1 VIEW COMPARISON:  05/04/2024 FINDINGS: Patient slightly rotated to the left. Lungs are adequately inflated without focal airspace consolidation or effusion. Cardiomediastinal silhouette and remainder of the exam is unchanged. IMPRESSION: No active disease. Electronically Signed   By: Toribio Agreste M.D.   On: 07/25/2024 11:22     .Critical  Care  Performed by: Daralene Lonni BIRCH, PA-C Authorized by: Daralene Lonni BIRCH, PA-C   Critical care provider statement:    Critical care time (minutes):  35   Critical care was necessary to treat or prevent imminent or life-threatening deterioration of the following  conditions:  Sepsis   Critical care was time spent personally by me on the following activities:  Development of treatment plan with patient or surrogate, discussions with consultants, evaluation of patient's response to treatment, examination of patient, ordering and review of laboratory studies, ordering and review of radiographic studies, ordering and performing treatments and interventions, pulse oximetry, re-evaluation of patient's condition and review of old charts   I assumed direction of critical care for this patient from another provider in my specialty: no     Care discussed with: admitting provider      Medications Ordered in the ED  lactated ringers  infusion ( Intravenous New Bag/Given 07/25/24 1251)  lactated ringers  bolus 1,000 mL (0 mLs Intravenous Stopped 07/25/24 1229)    And  lactated ringers  bolus 1,000 mL (1,000 mLs Intravenous New Bag/Given 07/25/24 1207)  levofloxacin  (LEVAQUIN ) IVPB 750 mg (750 mg Intravenous New Bag/Given 07/25/24 1124)  acetaminophen  (TYLENOL ) suppository 650 mg (650 mg Rectal Given 07/25/24 1247)                                    Medical Decision Making Amount and/or Complexity of Data Reviewed Labs: ordered. Radiology: ordered.  Risk OTC drugs. Prescription drug management. Decision regarding hospitalization.   This patient presents to the ED for concern of legs, fatigue, fever, cough, this involves an extensive number of treatment options, and is a complaint that carries with it a high risk of complications and morbidity.  The differential diagnosis includes sepsis, pneumonia, urinary tract infection, pulmonary embolus, ACS, pericarditis, myocarditis, endocarditis,  CHF   Co morbidities that complicate the patient evaluation  Parkinson's   Additional history obtained:  Additional history obtained from caregiver External records from outside source obtained and reviewed including medical records   Lab Tests:  I Ordered, and personally interpreted labs.  The pertinent results include: No leukocytosis, anemia at baseline, normal kidney function liver function, unremarkable electrolytes, negative lactic acid, negative viral swab, urinalysis with large hemoglobin and trace leukocytes   Imaging Studies ordered:  I ordered imaging studies including chest x-ray I independently visualized and interpreted imaging which showed no acute cardiopulmonary process I agree with the radiologist interpretation   Cardiac Monitoring: / EKG:  The patient was maintained on a cardiac monitor.  I personally viewed and interpreted the cardiac monitored which showed an underlying rhythm of: Initial EKG with normal sinus rhythm, repeat EKG with atrial fibrillation with RVR, no ST/T wave changes, no ischemic changes, no STEMI   Consultations Obtained:  I requested consultation with the hospitalist,  and discussed lab and imaging findings as well as pertinent plan - they recommend: Admission   Problem List / ED Course / Critical interventions / Medication management  Patient is doing well at this time and does remain stable.  He is resting more comfortably at this point.  Still concerned about possible pneumonia in this patient despite his negative chest x-ray.  He has been treated with Levaquin  at this point given his penicillin allergy.  Urinalysis is equivocal at this point for urinary tract infection but urine culture has been added.  Patient had negative viral swab.  Blood work is otherwise been unremarkable at this point.  Blood pressure has improved with IV fluids.  Have discussed patient case with Dr. Ricky with the hospitalist service who has excepted for  admission. I ordered medication including IV fluids, Levaquin  for sepsis Reevaluation of the patient  after these medicines showed that the patient improved I have reviewed the patients home medicines and have made adjustments as needed   Social Determinants of Health:  Lives at long-term care facility   Test / Admission - Considered:  Admission        Final diagnoses:  Sepsis, due to unspecified organism, unspecified whether acute organ dysfunction present [facility])  Acute respiratory failure with hypoxia [facility])    ED Discharge Orders     None          Daralene Lonni BIRCH, PA-C 07/25/24 1300    Freddi Hamilton, MD 07/27/24 [phone]

## 2024-07-25 NOTE — Consult Note (Signed)
 Consultation Note Date: 07/25/2024   Patient Name: Jay Rivas  DOB: 02-20-1951  MRN: [id number]  Age / Sex: 73 y.o., male  PCP: Duanne Butler DASEN, MD Referring Physician: Ricky Fines, MD  Reason for Consultation: Establishing goals of care  HPI/Patient Profile: 73 y.o. male  with past medical history of Parkinson's disease, dementia, hypertensive, history of MI, spinal stenosis admitted on 07/25/2024 with fever, malaise, and productive cough.   Workup revealed sepsis felt to be secondary to likely aspiration pneumonia and UTI.  He was started on antibiotics, bronchodilators, and supplemental O2.  He was also given IV fluids.  Speech therapy consulted given reports of difficulty swallowing.  PMT has been consulted to assist with goals of care conversation.  Prior records reviewed.  I see that patient had a hospital admission in July 2025 for seizure-like activity.  Total of 2 admissions including this 1 over the past 6 months.  I also see that he is followed by [facility] clinic for his seizures and corticobasal degeneration/atypical parkinsonian.  Last visit appears to have been in May 2025.  They recommended updating advance directives and living will and considering long-term care options.  I see that palliative care was also recommended at this visit.  Today, labs independently reviewed.  CMP largely unremarkable with some mild hyperglycemia (questionable last time had any p.o. intake).  Chloride also minimally low at 96.  Renal within normal limits.  Mild hypocalcemia noted in the setting of hypoalbuminemia.  Calcium  corrects to 9.1 when that is taken into account. Low albumin levels are associated with greater disease burden and poorer long-term prognosis.  CBC reveals normal white blood cell count.  Hemoglobin 11 g/dL which appears consistent with prior levels obtained anywhere from 2 months to 2 years ago.  Hemoglobin range appears to be in the 11 to 12 g/dL  per previous records.  Viral panel negative.  Twelve-lead EKG independently reviewed.  Appears to be in normal sinus rhythm.  QTc normal.  Chest x-ray independently reviewed there are some limitations due to patient positioning but overall lungs appear clear.  Vital signs reviewed.  Patient with some intermittent tachycardia and tachypnea.  He desaturated on room air but O2 sat stable with 2 L via nasal cannula.  Blood pressure levels are variable with some intermittent hypotension noted.  SBPs largely in the 90s.  Medication administration record reviewed no as needed symptom meds given on 24-hour look back.  Patient is unable to provide history or review of systems due to an acute exacerbation of chronic cognitive impairment.  Therefore, independent history obtained from representative of patient's care facility.  Clinical Assessment and Goals of Care:  I have reviewed medical records including EPIC notes, labs and imaging (independently reviewed), medication administration record, vital signs, assessed the patient and then met with patient and staff from his care home to discuss diagnosis, prognosis, GOC, EOL wishes, disposition and options. Collaborated directly with attending physician, TOC, and bedside nursing staff.  Attempted to contact patient's wife via phone.  No answer.  Left voicemail requesting return call.  I introduced Palliative Medicine as specialized medical care for people living with serious illness. It focuses on providing relief from the symptoms and stress of a serious illness. The goal is to improve quality of life for both the patient and the family.  We discussed a brief life review of the patient and then focused on their current illness.   I attempted to elicit values and goals of care important to  the patient.    Medical History Review and Family/Patient Understanding:   I spoke with care provider from patient's long-term care home.  She is very familiar with this  patient and has an excellent understanding of his chronic conditions.  I did provide her an update on our concerns regarding his current acute clinical condition.  Social History:  Patient lives in a care home for individuals who typically enter the home at assisted living facility level of care but can age and place all the way to complete functional dependency.  It is a small home with only a few patients.  Nursing staff are very familiar with the patient's and their family.  She also provides me some background on his family.  She shares that patient has a wife who lives in [place] but is elderly.  He also has a son and a daughter.  The daughter lives in [place] and his son lives in [place].  She typically communicates with the patient's wife and son.  Functional and Nutritional State:  She reports that at baseline he will usually talk and say a few words.  He is quiet but she states that he can be a jokester and often laugh/smiles.  She shares that he is very familiar with his family and will respond readily with them.  She shares that they are very involved and take him out to eat and for activities on occasion.  He is ambulatory with shuffling gait.  Unable to use assistive device due to tremors.  He does require assistance with bathing, dressing, and toileting.  He uses briefs and is on a toileting schedule but still has some episodes of incontinence.  He requires assistance with meals but can eat finger foods.  He does have significant tremors.  She also reports that lately he has had some trouble swallowing.  At baseline he has a good appetite.  Palliative Symptoms:  Tremors  Advance Directives/Goals of Care/Anticipatory care planning Discussion:  Advance care planning discussion limited due to patient's family being unavailable and his baseline cognitive status.  Patient's caregiver from his long-term care home indicates that he is a DNR and has provided the hospital with his  goldenrod form.  She also shares that he is followed by outpatient palliative care through  [facility].  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  We also talked about how acute illnesses can impact the disease trajectory of neurodegenerative conditions.  She does share that they have been working on getting a MOST form completed and that this will be very helpful for the care home to have on file when he returns.  She shares that the patient's son and wife plan to come to the hospital tomorrow.  Will plan to follow-up and complete MOST form.  She does share that even if he is quite functionally dependent after this hospitalization they are able to care for individuals with varying levels of functional ability.  She also shares they do have access to therapy services.   PMT will continue to support holistically.  Primary Decision maker and health care surrogate:  OTHER (wife and son per care home representative)   Code Status:  DNR    SUMMARY OF RECOMMENDATIONS    CODE STATUS: DNR Continue to treat the treatable Speech therapy consult given concern for aspiration pneumonia and recent difficulty swallowing Symptoms stable on current regimen therefore no changes to symptom regimen at this time Palliative medicine team  will continue to follow for ongoing goals of care discussion, symptom management, and coordination of care.  Continue to follow with outpatient palliative at discharge  Code Status/Advance Care Planning: DNR   Symptom Management:  Symptoms stable at present, therefore continue symptom regimen per admitting team with PMT available as needed for support  Prognosis:  Unable to determine  Discharge Planning: To Be Determined      Primary Diagnoses: Present on Admission:  Sepsis [facility])    Physical Exam Constitutional:      General: He is not in acute distress.    Comments: Chronically ill-appearing  HENT:      Mouth/Throat:     Mouth: Mucous membranes are dry.  Pulmonary:     Effort: Pulmonary effort is normal. No respiratory distress.     Comments: Congested cough noted Skin:    General: Skin is warm and dry.  Neurological:     Mental Status: He is lethargic.     Vital Signs: BP 97/66   Pulse 96   Temp 99 F (37.2 C) (Axillary)   Resp (!) 21   Ht 5' 8 (1.727 m)   Wt 62 kg   SpO2 95%   BMI 20.78 kg/m  Pain Scale: Faces       SpO2: SpO2: 95 % O2 Device:SpO2: 95 % O2 Flow Rate: .O2 Flow Rate (L/min): 2 L/min   Palliative Assessment/Data: Current: 30 to 40%    Billing based on MDM: High  Problems Addressed: One acute or chronic illness or injury that poses a threat to life or bodily function  Amount and/or Complexity of Data: Category 1:Review of prior external note(s) from each unique source and Assessment requiring an independent historian(s), Category 2:Independent interpretation of a test performed by another physician/other qualified health care professional (not separately reported), and Category 3:Discussion of management or test interpretation with external physician/other qualified health care professional/appropriate source (not separately reported)  Risks: n/a   Jay CHRISTELLA Pinal, NP  Palliative Medicine Team Team phone # [phone]  Thank you for allowing the Palliative Medicine Team to assist in the care of this patient. Please utilize secure chat with additional questions, if there is no response within 30 minutes please call the above phone number.  Palliative Medicine Team providers are available by phone from 7am to 7pm daily and can be reached through the team cell phone.  Should this patient require assistance outside of these hours, please call the patient's attending physician.

## 2024-07-25 NOTE — ED Triage Notes (Signed)
 Pt ;bib rcems from [facility] in [place]. C/o of a fever x 2 days. Pt is lethargic today per facility. Pt has a congested cough. Vss stable w/ EMS other than being febrile. Cbg 120 w/ ems. 18g LFA from EMS.

## 2024-07-25 NOTE — Plan of Care (Signed)
  Problem: Pain Managment: Goal: General experience of comfort will improve and/or be controlled Outcome: Progressing

## 2024-07-25 NOTE — Progress Notes (Signed)
 Bedside swallow was conducted by Zachary Horn RN and Annabella Chancy RN. Patient failed bedside swallow. MD notified. Patient was made NPO and speech eval was ordered.

## 2024-07-26 DIAGNOSIS — A419 Sepsis, unspecified organism: Secondary | ICD-10-CM | POA: Diagnosis not present

## 2024-07-26 DIAGNOSIS — J9601 Acute respiratory failure with hypoxia: Secondary | ICD-10-CM | POA: Diagnosis not present

## 2024-07-26 DIAGNOSIS — G3185 Corticobasal degeneration: Secondary | ICD-10-CM | POA: Diagnosis not present

## 2024-07-26 DIAGNOSIS — J690 Pneumonitis due to inhalation of food and vomit: Secondary | ICD-10-CM

## 2024-07-26 DIAGNOSIS — Z789 Other specified health status: Secondary | ICD-10-CM

## 2024-07-26 DIAGNOSIS — Z7189 Other specified counseling: Secondary | ICD-10-CM | POA: Diagnosis not present

## 2024-07-26 DIAGNOSIS — F0280 Dementia in other diseases classified elsewhere without behavioral disturbance: Secondary | ICD-10-CM

## 2024-07-26 DIAGNOSIS — Z515 Encounter for palliative care: Secondary | ICD-10-CM | POA: Diagnosis not present

## 2024-07-26 DIAGNOSIS — R6520 Severe sepsis without septic shock: Secondary | ICD-10-CM | POA: Diagnosis not present

## 2024-07-26 LAB — CBC
HCT: 30.9 % — ABNORMAL LOW (ref 39.0–52.0)
Hemoglobin: 10.4 g/dL — ABNORMAL LOW (ref 13.0–17.0)
MCH: 31.9 pg (ref 26.0–34.0)
MCHC: 33.7 g/dL (ref 30.0–36.0)
MCV: 94.8 fL (ref 80.0–100.0)
Platelets: 120 K/uL — ABNORMAL LOW (ref 150–400)
RBC: 3.26 MIL/uL — ABNORMAL LOW (ref 4.22–5.81)
RDW: 12.4 % (ref 11.5–15.5)
WBC: 5.7 K/uL (ref 4.0–10.5)
nRBC: 0 % (ref 0.0–0.2)

## 2024-07-26 LAB — URINE CULTURE: Culture: NO GROWTH

## 2024-07-26 LAB — BASIC METABOLIC PANEL WITH GFR
Anion gap: 12 (ref 5–15)
BUN: 14 mg/dL (ref 8–23)
CO2: 27 mmol/L (ref 22–32)
Calcium: 8.5 mg/dL — ABNORMAL LOW (ref 8.9–10.3)
Chloride: 97 mmol/L — ABNORMAL LOW (ref 98–111)
Creatinine, Ser: 0.85 mg/dL (ref 0.61–1.24)
GFR, Estimated: >60 mL/min (ref >60)
Glucose, Bld: 105 mg/dL — ABNORMAL HIGH (ref 70–99)
Potassium: 4 mmol/L (ref 3.5–5.1)
Sodium: 136 mmol/L (ref 135–145)

## 2024-07-26 MED ORDER — CHLORHEXIDINE GLUCONATE CLOTH 2 % EX PADS
6.0000 | MEDICATED_PAD | Freq: Every day | CUTANEOUS | Status: DC
Start: 2024-07-26 — End: 2024-07-29
  Administered 2024-07-26 – 2024-07-28 (×3): 6 via TOPICAL

## 2024-07-26 MED ORDER — POLYVINYL ALCOHOL 1.4 % OP SOLN: 1.0000 [drp] | OPHTHALMIC | Status: DC | PRN

## 2024-07-26 NOTE — Evaluation (Signed)
 Physical Therapy Evaluation Patient Details Name: Jay Rivas MRN: [id number] DOB: [birth year] Today's Date: 07/26/2024  History of Present Illness  Jay Rivas is a 73 y.o. male with medical history significant of dementia, Parkinson disease, hypertension, hyperlipidemia, GERD and seasonal rhinitis; who presented to the hospital from nursing home secondary to fever, general malaise and productive cough.  Symptoms have been present for the last 2 to 3 days and worsening.  At time of arrival to the ED patient requiring 2 L supplementation to maintain saturation (do not use oxygen at baseline) patient was febrile, tachypneic and tachycardic.  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  Patient put back to bed and nursing staff notified to clean patient, his son in room. Patient will benefit from continued skilled physical therapy in hospital and recommended venue below to increase strength, balance, endurance for safe ADLs and gait.         If plan is discharge home, recommend the following: A lot of help with walking and/or transfers;A lot of help with bathing/dressing/bathroom;Help with stairs or ramp for entrance;Assist for transportation;Assistance with cooking/housework   Can travel by private vehicle        Equipment Recommendations None recommended by PT  Recommendations for Other Services       Functional Status Assessment Patient has had a recent decline in their functional status and demonstrates the ability to make significant improvements in function in a reasonable and predictable amount of time.     Precautions / Restrictions  Precautions Precautions: Fall Recall of Precautions/Restrictions: Impaired Restrictions Weight Bearing Restrictions Per Provider Order: No      Mobility  Bed Mobility Overal bed mobility: Needs Assistance Bed Mobility: Supine to Sit, Sit to Supine     Supine to sit: Max assist, HOB elevated Sit to supine: Max assist   General bed mobility comments: increased time, labored movement    Transfers Overall transfer level: Needs assistance Equipment used: 1 person hand held assist Transfers: Sit to/from Stand Sit to Stand: Max assist, +2 physical assistance           General transfer comment: Sit to stand from EOB followed by lateral steps to L side. Total of x2 reps of sit to stand. +2 assist today for safety.    Ambulation/Gait Ambulation/Gait assistance: Max assist Gait Distance (Feet): 2 Feet Assistive device: 2 person hand held assist Gait Pattern/deviations: Decreased step length - right, Decreased step length - left, Decreased stride length, Shuffle Gait velocity: slow     General Gait Details: limited to a couple of shuffling side steps before having to sit due to weakness and incontinent of stool  Stairs            Wheelchair Mobility     Tilt Bed    Modified Rankin (Stroke Patients Only)       Balance Overall balance assessment: Needs assistance Sitting-balance support: Feet supported, No upper extremity supported Sitting balance-Leahy Scale: Poor Sitting balance - Comments: fair/poor seated at EOB   Standing balance support: During functional activity, No upper extremity supported Standing balance-Leahy Scale: Poor Standing balance comment: hand held assist  Pertinent Vitals/Pain Pain Assessment Pain Assessment: Faces Faces Pain Scale: No hurt    Home Living Family/patient expects to be discharged to:: Assisted living                 Home Equipment: Rolling Walker (2 wheels) Additional  Comments: Son providing additional information today. Reports the pt cannot use a RW due to R UE intention tremors.    Prior Function Prior Level of Function : Needs assist       Physical Assist : ADLs (physical);Mobility (physical) Mobility (physical): Gait;Transfers;Stairs;Bed mobility ADLs (physical): IADLs;Toileting;Dressing;Bathing;Grooming;Feeding Mobility Comments: Son reports pt is able to ambualte without AD while shuffling his feet and with additional physical assist. ADLs Comments: Son reports pt is assisted for all ADL's at the facility.     Extremity/Trunk Assessment   Upper Extremity Assessment Upper Extremity Assessment: Defer to OT evaluation    Lower Extremity Assessment Lower Extremity Assessment: Generalized weakness    Cervical / Trunk Assessment Cervical / Trunk Assessment: Kyphotic  Communication   Communication Communication: Impaired Factors Affecting Communication: Difficulty expressing self    Cognition Arousal: Alert Behavior During Therapy: WFL for tasks assessed/performed                             Following commands: Impaired Following commands impaired: Follows one step commands inconsistently, Follows one step commands with increased time     Cueing Cueing Techniques: Verbal cues, Gestural cues, Tactile cues     General Comments      Exercises     Assessment/Plan    PT Assessment Patient needs continued PT services  PT Problem List Decreased strength;Decreased range of motion;Decreased activity tolerance;Decreased balance;Decreased mobility;Decreased coordination;Decreased safety awareness       PT Treatment Interventions DME instruction;Gait training;Stair training;Functional mobility training;Therapeutic activities;Therapeutic exercise;Balance training;Patient/family education    PT Goals (Current goals can be found in the Care Plan section)  Acute Rehab PT Goals Patient Stated Goal: return home PT Goal  Formulation: With patient/family Time For Goal Achievement: 07/30/24 Potential to Achieve Goals: Good    Frequency Min 3X/week     Co-evaluation PT/OT/SLP Co-Evaluation/Treatment: Yes Reason for Co-Treatment: To address functional/ADL transfers PT goals addressed during session: Mobility/safety with mobility;Balance OT goals addressed during session: ADL's and self-care       AM-PAC PT 6 Clicks Mobility  Outcome Measure Help needed turning from your back to your side while in a flat bed without using bedrails?: A Lot Help needed moving from lying on your back to sitting on the side of a flat bed without using bedrails?: A Lot Help needed moving to and from a bed to a chair (including a wheelchair)?: A Lot Help needed standing up from a chair using your arms (e.g., wheelchair or bedside chair)?: A Lot Help needed to walk in hospital room?: A Lot Help needed climbing 3-5 steps with a railing? : Total 6 Click Score: 11    End of Session   Activity Tolerance: Patient tolerated treatment well;Patient limited by fatigue Patient left: in bed;with call bell/phone within reach Nurse Communication: Mobility status PT Visit Diagnosis: Unsteadiness on feet (R26.81);Other abnormalities of gait and mobility (R26.89);Muscle weakness (generalized) (M62.81);Apraxia (R48.2)    Time: 8559-8491 PT Time Calculation (min) (ACUTE ONLY): 28 min   Charges:   PT Evaluation $PT Eval Moderate Complexity: 1 Mod PT Treatments $Therapeutic Activity: 23-37 mins PT General Charges $$ ACUTE PT VISIT: 1 Visit  4:22 PM, 07/26/24 Lynwood Music, MPT Physical Therapist with [facility] [phone] office [phone] mobile phone

## 2024-07-26 NOTE — TOC Initial Note (Signed)
 Transition of Care [facility]) - Initial/Assessment Note    Patient Details  Name: Jay Rivas MRN: [id number] Date of Birth: [birth year]  Transition of Care [facility]) CM/SW Contact:    Mcarthur Saddie Kim, LCSW Phone Number: 07/26/2024, 10:04 AM  Clinical Narrative: Pt admitted due to sepsis. Per son, pt has been a resident at [facility] for about 4 months. Family requests return to facility at d/c. LCSW spoke with Sonny, Production designer, theatre/television/film at facility. She reports pt requires limited assist with ADLs. Staff ambulate with pt for safety. He is active with [facility]- will add speech at facility's request. Delon agreeable. Pt is active with [facility] outpatient palliative. Rosina has been notified of admission. Per Sonny, okay to return. TOC will follow.                   Expected Discharge Plan: Assisted Living Barriers to Discharge: Continued Medical Work up   Patient Goals and CMS Choice Patient states their goals for this hospitalization and ongoing recovery are:: return to [facility]   Choice offered to / list presented to : Adult Children Ada ownership interest in [facility].provided to::  (n/a)    Expected Discharge Plan and Services In-house Referral: Clinical Social Work   Post Acute Care Choice: Resumption of Svcs/PTA Provider Living arrangements for the past 2 months: Assisted Living Facility                           HH Arranged: PT, Speech Therapy HH Agency: [facility] Date [facility] Agency Contacted: 07/26/24 Time HH Agency Contacted: 1004 Representative spoke with at [facility] Agency: Delon  Prior Living Arrangements/Services Living arrangements for the past 2 months: Assisted Living Facility Lives with:: Facility Resident Patient language and need for interpreter reviewed:: Yes        Need for Family Participation in Patient Care: Yes (Comment) Care giver support system in place?: Yes (comment) Current home services: Home  PT Criminal Activity/Legal Involvement Pertinent to Current Situation/Hospitalization: No - Comment as needed  Activities of Daily Living   ADL Screening (condition at time of admission) Independently performs ADLs?: No Does the patient have a NEW difficulty with bathing/dressing/toileting/self-feeding that is expected to last >3 days?: No Does the patient have a NEW difficulty with getting in/out of bed, walking, or climbing stairs that is expected to last >3 days?: No Does the patient have a NEW difficulty with communication that is expected to last >3 days?: No Is the patient deaf or have difficulty hearing?: No Does the patient have difficulty seeing, even when wearing glasses/contacts?: No Does the patient have difficulty concentrating, remembering, or making decisions?: Yes  Permission Sought/Granted                  Emotional Assessment   Attitude/Demeanor/Rapport: Unable to Assess Affect (typically observed): Unable to Assess Orientation: : Oriented to Self Alcohol / Substance Use: Not Applicable Psych Involvement: No (comment)  Admission diagnosis:  Acute respiratory failure with hypoxia (HCC) [J96.01] Sepsis (HCC) [A41.9] Sepsis, due to unspecified organism, unspecified whether acute organ dysfunction present [facility]) [A41.9] Patient Active Problem List   Diagnosis Date Noted   Sepsis (HCC) 07/25/2024   Seizure-like activity (HCC) 05/04/2024   Acute metabolic encephalopathy 05/04/2024   Parkinsonism (HCC) 05/04/2024   Major neurocognitive disorder (HCC) 05/04/2024   Iron deficiency anemia, unspecified 11/14/2022   Carpal tunnel syndrome 11/14/2022   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 [place] [address] Phone:  [phone] Fax: [phone]     Social Drivers of Health (SDOH) Social History: SDOH Screenings   Food Insecurity: Patient Unable To Answer (07/25/2024)  Housing: Patient Unable To Answer (07/25/2024)  Transportation Needs: Patient Unable To Answer (07/25/2024)  Utilities: Patient Unable To Answer (07/25/2024)  Alcohol Screen: Low Risk  (12/21/2023)  Depression (PHQ2-9): Low Risk  (12/21/2023)  Financial Resource Strain: Low Risk  (12/21/2023)  Physical Activity: Sufficiently Active (12/21/2023)  Social Connections: Unknown (07/25/2024)  Stress: No Stress Concern Present (12/21/2023)  Tobacco Use: Medium Risk (07/25/2024)  Health Literacy: Adequate Health Literacy (12/21/2023)   SDOH Interventions:     Readmission Risk Interventions     No data to display

## 2024-07-26 NOTE — Plan of Care (Signed)
  Problem: Clinical Measurements: Goal: Cardiovascular complication will be avoided Outcome: Progressing   Problem: Elimination: Goal: Will not experience complications related to urinary retention Outcome: Progressing   Problem: Pain Managment: Goal: General experience of comfort will improve and/or be controlled Outcome: Progressing   Problem: Safety: Goal: Ability to remain free from injury will improve Outcome: Progressing   Problem: Nutrition: Goal: Adequate nutrition will be maintained Outcome: Not Progressing

## 2024-07-26 NOTE — Evaluation (Signed)
 Clinical/Bedside Swallow Evaluation Patient Details  Name: Jay Rivas MRN: [id number] Date of Birth: 10/09/1951  Today's Date: 07/26/2024 Time: SLP Start Time (ACUTE ONLY): 9044 SLP Stop Time (ACUTE ONLY): 1020 SLP Time Calculation (min) (ACUTE ONLY): 25 min  Past Medical History:  Past Medical History:  Diagnosis Date   Carpal tunnel syndrome on both sides    Dementia (HCC)    diagnosed in jan 2019, pt states medication seems to be helping, A&Ox4   HLD (hyperlipidemia)    Hypertension    Memory disorder 11/24/2017   Myocardial infarction [facility])    Dr. Levern   Spinal stenosis of cervical region    Past Surgical History:  Past Surgical History:  Procedure Laterality Date   ABDOMINAL SURGERY     CARDIAC CATHETERIZATION  2005   stent   CARPAL TUNNEL RELEASE Right    CARPAL TUNNEL RELEASE Right 03/09/2018   Procedure: RIGHT CARPAL TUNNEL RELEASE;  Surgeon: Murrell Arley, MD;  Location: [facility];  Service: Orthopedics;  Laterality: Right;   CARPAL TUNNEL RELEASE Left 11/02/2018   Procedure: LEFT CARPAL TUNNEL RELEASE;  Surgeon: Murrell Arley, MD;  Location: [facility];  Service: Orthopedics;  Laterality: Left;   TENDON TRANSFER Left 11/02/2018   Procedure: LEFT CAMITZ TRANSFER;  Surgeon: Murrell Arley, MD;  Location: [facility];  Service: Orthopedics;  Laterality: Left;   TRIGGER FINGER RELEASE Right 03/09/2018   Procedure: RELEASE TRIGGER FINGER/A-1 PULLEY RIGHT MIDDLE, RING AND SMALL FINGERS;  Surgeon: Murrell Arley, MD;  Location: [facility];  Service: Orthopedics;  Laterality: Right;   ULNAR NERVE TRANSPOSITION Left 11/02/2018   Procedure: LEFT ULNAR NERVE DECOMPRESSION;  Surgeon: Murrell Arley, MD;  Location: [facility];  Service: Orthopedics;  Laterality: Left;   ULNAR TUNNEL RELEASE Right 03/09/2018   Procedure: CUBITAL TUNNEL DECOMPRESSION RIGHT ELBOW;  Surgeon: Murrell Arley, MD;  Location: [facility];  Service: Orthopedics;  Laterality: Right;   HPI:  Jay Rivas is a 73 y.o. male with medical history significant of dementia, Parkinson's disease, hypertension, hyperlipidemia, GERD and seasonal rhinitis; who presented to the hospital from nursing home on 07/25/24 secondary to fever, general malaise and productive cough.  Symptoms have been present for the last 2 to 3 days and worsening.  At time of arrival to the ED patient requiring 2 L supplementation to maintain saturation (does not use oxygen at baseline) patient was febrile, tachypneic and tachycardic.  Urinalysis with concerns for the presence of a UTI and a chest x-ray not demonstrating acute infiltrates.     ST consulted for swallow evaluation.    Assessment / Plan / Recommendation  Clinical Impression  Pt seen for clinical swallow evaluation with oral retention, impaired mastication/propulsion, delay in the initiation of the swallow and delayed cough noted during assessment of varying volumes/consistencies of food/liquids.  Single ice chips and tsp thin liquids elicited delayed wet cough.  Nectar-thickened liquids promoted improved timing of swallow and did not elicit delayed cough.  Myoclonic jerking movements impacted sustained attention when swallowing various consistencies and pt also required mod verbal/auditory cueing for initiating swallow response.  Pt would hold most consistencies within oral cavity prior to swallowing.  Recommend initiating a conservative diet of Dysphagia 1(puree)/nectar-thickened liquids via tsp/small sips with FULL assistance/supervision with meals.  Potential objective assessment may be beneficial during acute stay to determine if aspiration present and/or determine safest diet consistency.  ST will f/u for dysphagia tx during acute  stay.  Thank you for this consult. SLP Visit Diagnosis: Dysphagia, oropharyngeal phase (R13.12)    Aspiration Risk  Mild aspiration risk;Moderate aspiration risk    Diet  Recommendation   Nectar;Dysphagia 1 (puree)  Medication Administration: Crushed with puree (or via alternative means)    Other  Recommendations Oral Care Recommendations: Oral care BID;Staff/trained caregiver to provide oral care     Assistance Recommended at Discharge  Frequent  Functional Status Assessment Patient has had a recent decline in their functional status and demonstrates the ability to make significant improvements in function in a reasonable and predictable amount of time.  Frequency and Duration min 2x/week  1 week       Prognosis Prognosis for improved oropharyngeal function: Good Barriers to Reach Goals: Cognitive deficits      Swallow Study   General Date of Onset: 07/25/24 HPI: Jay Rivas is a 73 y.o. male with medical history significant of dementia, Parkinson disease, hypertension, hyperlipidemia, GERD and seasonal rhinitis; who presented to the hospital from nursing home secondary to fever, general malaise and productive cough.  Symptoms have been present for the last 2 to 3 days and worsening.  At time of arrival to the ED patient requiring 2 L supplementation to maintain saturation (do not use oxygen at baseline) patient was febrile, tachypneic and tachycardic.  Urinalysis with concerns for the presence of a UTI and a chest x-ray not demonstrating acute infiltrates.     ST consulted for swallow evaluation. Type of Study: Bedside Swallow Evaluation Previous Swallow Assessment: n/a Diet Prior to this Study: NPO Temperature Spikes Noted: Yes Respiratory Status: Nasal cannula History of Recent Intubation: No Behavior/Cognition: Alert;Requires cueing Oral Cavity Assessment: Dry Oral Care Completed by SLP: Yes Oral Cavity - Dentition: Dentures, top Self-Feeding Abilities: Total assist Patient Positioning: Upright in bed Baseline Vocal Quality: Wet;Low vocal intensity Volitional Cough: Weak;Congested Volitional Swallow: Unable to elicit    Oral/Motor/Sensory  Function Overall Oral Motor/Sensory Function: Generalized oral weakness   Ice Chips Ice chips: Impaired Presentation: Spoon Oral Phase Impairments: Impaired mastication Oral Phase Functional Implications: Prolonged oral transit;Oral holding Pharyngeal Phase Impairments: Suspected delayed Swallow;Cough - Delayed   Thin Liquid Thin Liquid: Impaired Presentation: Spoon Oral Phase Functional Implications: Oral holding Pharyngeal  Phase Impairments: Suspected delayed Swallow;Cough - Delayed    Nectar Thick Nectar Thick Liquid: Impaired Presentation: Spoon Oral phase functional implications: Oral holding;Prolonged oral transit Pharyngeal Phase Impairments: Suspected delayed Swallow   Honey Thick Honey Thick Liquid: Not tested   Puree Puree: Impaired Presentation: Spoon Oral Phase Functional Implications: Oral holding;Prolonged oral transit Pharyngeal Phase Impairments: Suspected delayed Swallow   Solid     Solid: Not tested      Jay Rivas,M.S.,CCC-SLP 07/26/2024,10:35 AM

## 2024-07-26 NOTE — Plan of Care (Signed)
  Problem: Acute Rehab PT Goals(only PT should resolve) Goal: Pt Will Go Supine/Side To Sit Outcome: Progressing Flowsheets (Taken 07/26/2024 1624) Pt will go Supine/Side to Sit:  with minimal assist  with moderate assist Goal: Patient Will Transfer Sit To/From Stand Outcome: Progressing Flowsheets (Taken 07/26/2024 1624) Patient will transfer sit to/from stand:  with minimal assist  with moderate assist Goal: Pt Will Transfer Bed To Chair/Chair To Bed Outcome: Progressing Flowsheets (Taken 07/26/2024 1624) Pt will Transfer Bed to Chair/Chair to Bed:  with min assist  with mod assist Goal: Pt Will Ambulate Outcome: Progressing Flowsheets (Taken 07/26/2024 1624) Pt will Ambulate:  25 feet  with minimal assist  with moderate assist Note: Hand held assist   4:24 PM, 07/26/24 Lynwood Music, MPT Physical Therapist with [facility] [phone] office [phone] mobile phone

## 2024-07-26 NOTE — Plan of Care (Signed)
  Problem: Clinical Measurements: Goal: Diagnostic test results will improve Outcome: Progressing Goal: Respiratory complications will improve Outcome: Progressing Goal: Cardiovascular complication will be avoided Outcome: Progressing   Problem: Nutrition: Goal: Adequate nutrition will be maintained Outcome: Not Progressing

## 2024-07-26 NOTE — Progress Notes (Signed)
 Progress Note   Patient: Jay Rivas DOB: [birth year] DOA: 07/25/2024     1 DOS: the patient was seen and examined on 07/26/2024   Brief hospital admission narrative: Jay Rivas is a 73 y.o. male with medical history significant of dementia, Parkinson disease, hypertension, hyperlipidemia, GERD and seasonal rhinitis; who presented to the hospital from nursing home secondary to fever, general malaise and productive cough.  Symptoms have been present for the last 2 to 3 days and worsening.  At time of arrival to the ED patient requiring 2 L supplementation to maintain saturation (do not use oxygen at baseline) patient was febrile, tachypneic and tachycardic.  Urinalysis with concerns for the presence of a UTI and a chest x-ray not demonstrating acute infiltrates.   IV fluids provided, cultures taken and IV antibiotics initiated.  Patient received antipyretics and oxygen supplementation.  TR[facility] has been contacted to place patient in the hospital for further evaluation and management.   Of note, there had not been any chest pain, abdominal pain, focal neurologic deficits, hematuria/melena, hematochezia, hematemesis or complaints of abdominal pain.  Per family reports patient has been started on antiseizure medications about 1 month ago after having sudden seizure-like activities.   Respiratory panel by PCR demonstrated to be negative for COVID, influenza and RSV.  Assessment and plan 1-sepsis: Present at time of admission - Appears to be in the setting of pneumonia (most likely aspiration with underlying history of Parkinson) and presumed UTI -Continue treatment with IV antibiotics - Follow culture results - Patient has still spiking fever; will continue antipyretics and supportive care - Patient with underlying history of penicillin allergy.   2-difficulty swallowing/dysphagia - Patient with underlying swallowing difficulties in the setting of Parkinson disease and dementia -  Will follow speech therapy recommendations and assess diet tolerance prior to resume oral meds - Continue supportive care and IV medications.     3-history of dementia/Parkinson's - Continue to hold Aricept  until able to take by mouth - Speech therapy has been consulted and will follow recommendations regarding diet consistency - Continue consult reorientation and supportive care.    4-presumed seizure disorder - Continue IV Depakote for now - No seizure appreciated at the moment - With plan to resume home oral medications when able to take by mouth.   5-hypertension - Overall blood pressure is soft but stable; better. - Continue holding antihypertensive agent - Maintain adequate hydration - Follow vital signs.   - Midodrine has been ordered (waiting for availability with PO's meds).  6-seasonal allergies - Continue DuoNeb - Planning to resume the use of Allegra  when able to tolerate p.o.'s.   7-DNR/DNI - Patient remains at high risk for decompensation - DNR/DNI 8 CODE STATUS has been confirmed - Appreciate assistance and recommendation by palliative care service; MOST form has been completed. - Planning to continue to treat was treatable and follow patient response. - In case of further declining/decompensation plan will be for transition to comfort care.  Subjective:  Still spiking fever; no chest pain, no nausea, no vomiting appreciated.  Good saturation on 2 L supplementation.  Physical Exam: Vitals:   07/26/24 0848 07/26/24 1119 07/26/24 1200 07/26/24 1300  BP:   (!) 98/59   Pulse:   66 74  Resp:   19 17  Temp:  (!) 101 F (38.3 C)  98.7 F (37.1 C)  TempSrc:  Axillary  Axillary  SpO2: 97%  99% 96%  Weight:      Height:  General exam: Alert, awake, oriented x 1; able to answer simple questions and following commands. Respiratory system: Positive rhonchi appreciated bilaterally (right more than left).  No wheezing on exam.  No crackles. Cardiovascular  system: No rubs, no gallops, no JVD.  Rate controlled. Gastrointestinal system: Abdomen is nondistended, soft and nontender.  Positive bowel sounds. Central nervous system: Able to move 4 limbs spontaneously; positive rigidity/spasticity and nonintentional tremors on exam. Extremities: No cyanosis, clubbing or edema. Skin: No petechiae. Psychiatry: Judgement and insight appear impaired secondary to underlying cognitive disorders.   Data Reviewed: Basic metabolic panel: Sodium 136, potassium 4.0, chloride 97, bicarb 27, BUN 14, creatinine 0.85 and GFR >60 CBC: White blood cell 4.7, hemoglobin 10.4 and platelet count 120K  Family Communication: No family at bedside.  Disposition: Status is: Inpatient Remains inpatient appropriate because: IV therapy.  Anticipating discharge back to skilled nursing facility (long-term) once medically stable.  Time spent: 50 minutes  Author: Eric Nunnery, MD 07/26/2024 2:26 PM  For on call review [email or web address].

## 2024-07-26 NOTE — Evaluation (Signed)
 Occupational Therapy Evaluation Patient Details Name: Jay Rivas MRN: [id number] DOB: 01/16/1951 Today's Date: 07/26/2024   History of Present Illness   Jay Rivas is a 73 y.o. male with medical history significant of dementia, Parkinson disease, hypertension, hyperlipidemia, GERD and seasonal rhinitis; who presented to the hospital from nursing home secondary to fever, general malaise and productive cough.  Symptoms have been present for the last 2 to 3 days and worsening.  At time of arrival to the ED patient requiring 2 L supplementation to maintain saturation (do not use oxygen at baseline) patient was febrile, tachypneic and tachycardic.  Urinalysis with concerns for the presence of a UTI and a chest x-ray not demonstrating acute infiltrates (per MD)     Clinical Impressions Pt agreeable to OT and PT coe-valuation. Son entered the room once pt was seated at EOB and was able to provide living history. Pt does ambulate with assist at baseline and is assisted for all ADL's. Pt required max A for bed mobility and two reps of sit to stand. Therapist's assisting pt on either side during standing. Pt demonstrates significant intention tremors today. Max to total assist for all ADL's based on pt's ability today. Pt left in the bed due to having a bowel movement while standing. NT notified of pt's need to be cleaned and have a bed change. Pt will benefit from continued OT in the hospital to increase strength, balance, and endurance for safe ADL's.        If plan is discharge home, recommend the following:   Two people to help with walking and/or transfers;A lot of help with walking and/or transfers;A lot of help with bathing/dressing/bathroom;Assistance with cooking/housework;Assistance with feeding;Two people to help with bathing/dressing/bathroom;Direct supervision/assist for medications management;Assist for transportation;Help with stairs or ramp for entrance     Functional Status  Assessment   Patient has had a recent decline in their functional status and/or demonstrates limited ability to make significant improvements in function in a reasonable and predictable amount of time     Equipment Recommendations   None recommended by OT             Precautions/Restrictions   Precautions Precautions: Fall Recall of Precautions/Restrictions: Impaired Restrictions Weight Bearing Restrictions Per Provider Order: No     Mobility Bed Mobility Overal bed mobility: Needs Assistance Bed Mobility: Supine to Sit, Sit to Supine     Supine to sit: Max assist, HOB elevated Sit to supine: Max assist   General bed mobility comments: much assist; increase in tremors    Transfers Overall transfer level: Needs assistance   Transfers: Sit to/from Stand Sit to Stand: Max assist, +2 physical assistance           General transfer comment: Sit to stand from EOB followed by lateral steps to L side. Total of x2 reps of sit to stand. +2 assist today for safety.      Balance Overall balance assessment: Needs assistance Sitting-balance support: Feet supported, No upper extremity supported Sitting balance-Leahy Scale: Poor Sitting balance - Comments: poor to fair seated at EOB   Standing balance support: During functional activity (Assited to stand with use of gait belt.) Standing balance-Leahy Scale: Poor Standing balance comment: standing beside the EOB with use of gait belt by therapist's to stabilize the pt.                           ADL either performed or assessed with  clinical judgement   ADL Overall ADL's : Needs assistance/impaired;At baseline (Pt assisted for all at baseline per pt's son.) Eating/Feeding: Maximal assistance;Total assistance;Sitting   Grooming: Maximal assistance;Total assistance   Upper Body Bathing: Maximal assistance;Sitting   Lower Body Bathing: Total assistance   Upper Body Dressing : Maximal assistance;Sitting    Lower Body Dressing: Maximal assistance;Total assistance;Sitting/lateral leans   Toilet Transfer: Maximal assistance;Stand-pivot Toilet Transfer Details (indicate cue type and reason): Partially simulated by sit to stand and steps to head of bed. Toileting- Clothing Manipulation and Hygiene: Total assistance       Functional mobility during ADLs: Maximal assistance       Vision Baseline Vision/History:  (Son reports the pt has a significant R peripheral vision deficit at baseline.) Ability to See in Adequate Light: 3 Highly impaired Vision Assessment?: Vision impaired- to be further tested in functional context     Perception Perception: Not tested       Praxis Praxis: Impaired Praxis Impairment Details: Initiation, Ideomotor, Motor planning, Organization Praxis-Other Comments: Significant tremors with any attempts at active motor control.   Pertinent Vitals/Pain Pain Assessment Pain Assessment: Faces Faces Pain Scale: No hurt     Extremity/Trunk Assessment Upper Extremity Assessment Upper Extremity Assessment: Difficult to assess due to impaired cognition (Significant intention tremors that activate with any intentional movement including pt trying to grasp. Tremors noted in B UE with all intentional movement including when pt attempted to spit.)   Lower Extremity Assessment Lower Extremity Assessment: Defer to PT evaluation   Cervical / Trunk Assessment Cervical / Trunk Assessment: Kyphotic   Communication Communication Communication: Impaired Factors Affecting Communication: Difficulty expressing self   Cognition Arousal: Alert Behavior During Therapy: WFL for tasks assessed/performed Cognition: History of cognitive impairments             OT - Cognition Comments: Oriented to name only. Able to follow commands with cuing. Limited by motor planning deficits.                 Following commands: Impaired Following commands impaired: Follows one step  commands inconsistently (Not only a cognitive issue. Motor component appears to be a limiting factor.)     Cueing  General Comments   Cueing Techniques: Verbal cues;Gestural cues;Tactile cues                 Home Living Family/patient expects to be discharged to:: Assisted living                             Home Equipment: Rolling Walker (2 wheels)   Additional Comments: Son providing additional information today. Reports the pt cannot use a RW due to R UE intention tremors.      Prior Functioning/Environment Prior Level of Function : Needs assist       Physical Assist : ADLs (physical);Mobility (physical) Mobility (physical): Gait;Transfers;Stairs;Bed mobility ADLs (physical): IADLs;Toileting;Dressing;Bathing;Grooming;Feeding Mobility Comments: Son reports pt is able to ambualte without AD while shuffling his feet and with additional physical assist. ADLs Comments: Son reports pt is assisted for all ADL's at the facility.    OT Problem List: Decreased strength;Decreased range of motion;Decreased activity tolerance;Impaired balance (sitting and/or standing);Impaired vision/perception;Decreased coordination;Decreased cognition;Decreased safety awareness   OT Treatment/Interventions: Self-care/ADL training;Therapeutic exercise;Therapeutic activities;Patient/family education;Balance training;Cognitive remediation/compensation;Visual/perceptual remediation/compensation;Neuromuscular education      OT Goals(Current goals can be found in the care plan section)   Acute Rehab OT Goals Patient Stated Goal: return to facility OT  Goal Formulation: With family Time For Goal Achievement: 08/09/24 Potential to Achieve Goals: Fair   OT Frequency:  Min 3X/week    Co-evaluation PT/OT/SLP Co-Evaluation/Treatment: Yes Reason for Co-Treatment: To address functional/ADL transfers   OT goals addressed during session: ADL's and self-care                        End of Session Equipment Utilized During Treatment: Gait belt;Oxygen (Pt is not on O2 at home and was removed from it today. Able to stay above 90% for O2 saturation. Nurse notified that pt was removed from supplemental O2.) Nurse Communication: Other (comment) (NT notified that the pt needed cleaned after having a bowel movement.)  Activity Tolerance: Patient tolerated treatment well Patient left: in bed;with call bell/phone within reach;with family/visitor present  OT Visit Diagnosis: Unsteadiness on feet (R26.81);Other abnormalities of gait and mobility (R26.89);Muscle weakness (generalized) (M62.81);Apraxia (R48.2);Other symptoms and signs involving cognitive function;Cognitive communication deficit (R41.841)                Time: 1450-1510 OT Time Calculation (min): 20 min Charges:  OT General Charges $OT Visit: 1 Visit OT Evaluation $OT Eval Low Complexity: 1 Low  Margart Zemanek OT, MOT  [name] 07/26/2024, 4:00 PM

## 2024-07-26 NOTE — Progress Notes (Signed)
 [facility]- [facility] Liaison Note:  This patient is currently enrolled in [facility] outpatient-based palliative care.  Please call for any outpatient based palliative care related questions or concerns.  Thank you, Greig Basket, BSN, RN [facility] Liaison [phone]

## 2024-07-26 NOTE — Plan of Care (Signed)
  Problem: Acute Rehab OT Goals (only OT should resolve) Goal: Pt. Will Perform Grooming Flowsheets (Taken 07/26/2024 1603) Pt Will Perform Grooming:  with min assist  sitting Goal: Pt. Will Perform Upper Body Dressing Flowsheets (Taken 07/26/2024 1603) Pt Will Perform Upper Body Dressing:  with min assist  with mod assist  sitting Goal: Pt. Will Transfer To Toilet Flowsheets (Taken 07/26/2024 1603) Pt Will Transfer to Toilet:  with mod assist  with min assist  ambulating Goal: Pt/Caregiver Will Perform Home Exercise Program Flowsheets (Taken 07/26/2024 1603) Pt/caregiver will Perform Home Exercise Program:  Increased ROM  Both right and left upper extremity  With minimal assist  Skyrah Krupp OT, MOT

## 2024-07-26 NOTE — Progress Notes (Signed)
 Spoke with SLP this AM and pt cleared for dysphagia  I diet with thickened liquids. Attempted to give pt meds crushed in applesauce, pt set up 90 degrees and coached to swallow, pt remained holding applesauce in mouth and then attempts to swallow failed with gurgling and subsequent spitting out of medications/applesauce. Alternative route given.

## 2024-07-26 NOTE — Progress Notes (Signed)
 Daily Progress Note   Patient Name: Jay Rivas       Date: 07/26/2024 DOB: 28-Nov-1950  Age: 73 y.o. MRN#: [id number] Attending Physician: Ricky Fines, MD Primary Care Physician: Duanne Butler DASEN, MD Admit Date: 07/25/2024  Reason for Consultation/Follow-up: Establishing goals of care  Subjective:  73 y.o. male  with past medical history of cortical basilar degenerative disorder/parkinsonian syndrome, seizures, dementia, hypertensive, history of MI, spinal stenosis admitted on 07/25/2024 with fever, malaise, and productive cough.    Workup revealed sepsis felt to be secondary to likely aspiration pneumonia and UTI.  He was started on antibiotics, bronchodilators, and supplemental O2.  He was also given IV fluids.  Speech therapy consulted given reports of difficulty swallowing.  Speech therapy was consulted and noted some swallowing impairments.  By recommended diet adjustment to nectar thick liquids with pured solids and swallowing strategies.  He will remain on speech therapy caseload.  Today, labs independently reviewed.  Overall appear stable.  Mild hypocalcemia noted likely some component of hypoalbuminemia contributing as albumin level slightly low on admission.  Renal function stable. White blood cell count remains normal.  Slight decline in hemoglobin to 10.4 g/dL (11 g/dL yesterday) and platelet count trending down as well to 120 (143 yesterday.  Would recommend continuing to trend.  Did receive some IV hydration so could be some component of dilution.  Vital signs variable with some intermittent hypotension and fevers.  O2 sat stable on 2 L of supplemental O2 via nasal cannula.  Medication administration record reviewed.  Patient received 1 dose of Tylenol  650 mg for fever.  Otherwise, no as needed symptom meds on 24-hour look  back.  Independent history obtained from nursing staff and family as patient has acute on chronic cognitive impairment.  Per nursing, patient continues to have congested cough and run intermittent fevers.  Failed swallow screen and speech therapy recommended diet adjustment.  Tylenol  administered for fever and known recheck had improved.  They continue to monitor closely.  Patient denies any pain but otherwise history is quite limited.  ACP:  The patient and family consented to a voluntary Advance Care Planning Conversation in person. Individuals present for the conversation: Patient, this NP, son Jasson Siegmann), and daughter in Social worker.  Engaged in a detailed conversation with the patient about the seriousness of the patient's current illness, in light of his age and complex medical history, and explained the potential implications this may have for his recovery and long-term well-being.  We extensively discussed the disease trajectory of neurocognitive disorders and where patient appears to be in this trajectory.  We talk a  bit about his journey with this illness.  He was in auctioneer by trade and was diagnosed with cortical basilar degeneration by [facility] clinic neurology about 7 years ago.  He was told he would only live about 2 to 3 years but has done quite well.  They share that about a year ago he was walking about a mile a day however, he suffered a fall and had some declines in his functional status.  He started needing more help.  He moved into a long-term care home called [facility] about 3 months ago and has done extremely well since being admitted there.  Functional status confirmed as ambulatory with slow shuffling gait, needs assistance for bathing, grooming, toileting, and occasionally feeding (can eat finger foods independently sometimes does need help with meals due to tremors.  Discussed that while we are hopeful for patient to improve and be able to return to his care home  and continued to have good quality of life, it is possible that his health could get worse and could get worse quickly.  We also discussed that his condition is progressive therefore continued ongoing declines are expected.  Therefore, discussed the importance of advance care planning.  Son is not certain about a living will or advanced directive but patient's son states that he is the designated healthcare power of attorney.  Goals of care elicited.  They are hopeful for patient to improve with treatment and return to his care home where they share that he receives excellent care.  Patient's son states that the most important thing for him at this point of his illness is to focus on comfort and quality of life. Engaged in discussion of advance directives including the limitations and potential burdens of CPR and intubation, particularly in the context of advanced age and serious underlying health conditions. We discussed how the potential consequences of surviving CPR/intubation/mechanical ventilation could significantly affect a person's quality of life--particularly if their definition of quality of life involves being comfortable.  Patient's son confirms he is already listed as a DNR and the son himself had Covid and had to be intubated so he is familiar with that and would not want his father to be put through that. Code status to be DNR/DNI. Reviewed the MOST form in detail with the patient, including how its components reflect his care preferences. Documentation was finalized and scanned into the electronic medical record.  We spent a good deal of time discussing the potential benefits and limitations of feeding tubes, with a particular focus on the limitations of long-term feeding tubes for irreversible conditions or at the end of life. They would consider trial of feeding tube depending on prognosis and if for reversible condition but otherwise would not want long term feeding tube. See below or ACP section  for MOST form details. The family remains hopeful that with treatment he will improve and be able to return to his care home where he has good quality of life and they can occasionally continue to take him out for dinners/visit etc. they would consider transition to comfort care if his prognosis were to deteriorate to the point where comfort and quality of life are being significantly affected.  Questions and concerns were addressed.  The family was encouraged to call with questions or concerns.  PMT will continue to support holistically.   I completed a MOST form today. The patient and family outlined their wishes for the following treatment decisions:  Cardiopulmonary Resuscitation: Do Not Attempt  Resuscitation (DNR/No CPR)  Medical Interventions: Limited Additional Interventions: Use medical treatment, IV fluids and cardiac monitoring as indicated, DO NOT USE intubation or mechanical ventilation. May consider use of less invasive airway support such as BiPAP or CPAP. Also provide comfort measures. Transfer to the hospital if indicated. Avoid intensive care.   Antibiotics: Antibiotics if indicated  IV Fluids: IV fluids for a defined trial period  Feeding Tube: Feeding tube for a defined trial period     Outcome of the conversations and/or documents completed: MOST/[employer]  I spent 35 minutes providing separately identifiable ACP services with the patient and/or surrogate decision maker in a voluntary, in-person conversation discussing the patient's wishes and goals as detailed in the above note.  Chart review/care coordination:  Completed extensive chart review including EPIC notes, labs (independently reviewed), vital signs, MAR. Coordinated care with [facility], attending physician, bedside nursing staff   Length of Stay: 1   Physical Exam Constitutional:      General: He is not in acute distress.    Comments: Chronically ill-appearing  Pulmonary:     Effort: Pulmonary effort is normal.  No respiratory distress.     Comments: Deep, congested cough noted, unable to expectorate sputum Skin:    General: Skin is warm and dry.  Neurological:     Mental Status: He is lethargic.     Comments: Drowsy but arousable to verbal stimuli and will answer questions with 1-2 word responses             Vital Signs: BP (!) 105/54   Pulse 72   Temp (!) 101 F (38.3 C) (Axillary) Comment: Notified Olivia RN  Resp (!) 21   Ht 5' 8 (1.727 m)   Wt 66.6 kg   SpO2 97%   BMI 22.32 kg/m  SpO2: SpO2: 97 % O2 Device: O2 Device: Nasal Cannula O2 Flow Rate: O2 Flow Rate (L/min): 2 L/min      Palliative Assessment/Data: Current: 40%   Palliative Care Assessment & Plan   Patient Profile/Assessment:  73 year old male with pertinent medical history of seizures and corticobasal degenerative disorder admitted for sepsis secondary to presumed aspiration pneumonia.  Speech therapy is following and have made adjustments to his diet due to swallowing impairments.  They plan to continue to follow.  Goals of care and advance directive discussion completed with patient and family.  Patient is to be a DNR/DNI, treat the treatable.  Goal is for him to improve enough to return to his care home where he can receive additional care and therapy if needed.  For further decline or prognosis were to worsen, family would consider comfort measures.   Recommendations/Plan:  DNR/DNI Continue to treat the treatable Continue speech therapy  Will order PT/OT consult as well  Hopeful to discharge back to his former care home Monitor for declines and continue to address goals of care based off of patient's status. Symptoms are largely stable on current regimen.  He does have some dry eye so will order artificial tears PRN Palliative medicine team will continue to follow for ongoing goals of care discussion, symptom management, and coordination of care.   Symptom management:  Ordered artificial tears as needed for  dry eye otherwise, symptoms stable at present therefore continue symptom management per admitting team with PMT available as needed for support  Prognosis:  Unable to determine    Discharge Planning: To Be Determined, likely back to care home with outpatient palliative (already established with a palliative agency) and possible  therapy support    Detailed review of medical records (labs, imaging, vital signs), medically appropriate exam, discussed with treatment team, counseling and education to patient, family, & staff, documenting clinical information, medication management, coordination of care    Billing based on MDM: High  Problems Addressed: One acute or chronic illness or injury that poses a threat to life or bodily function  Amount and/or Complexity of Data: Category 1:Assessment requiring an independent historian(s) and Category 3:Discussion of management or test interpretation with external physician/other qualified health care professional/appropriate source (not separately reported)  Risks: n/a         Laymon CHRISTELLA Pinal, NP  Palliative Medicine Team Team phone # [phone]  Thank you for allowing the Palliative Medicine Team to assist in the care of this patient. Please utilize secure chat with additional questions, if there is no response within 30 minutes please call the above phone number.  Palliative Medicine Team providers are available by phone from 7am to 7pm daily and can be reached through the team cell phone.  Should this patient require assistance outside of these hours, please call the patient's attending physician.

## 2024-07-27 DIAGNOSIS — R1310 Dysphagia, unspecified: Secondary | ICD-10-CM | POA: Diagnosis not present

## 2024-07-27 DIAGNOSIS — A419 Sepsis, unspecified organism: Secondary | ICD-10-CM | POA: Diagnosis not present

## 2024-07-27 DIAGNOSIS — N390 Urinary tract infection, site not specified: Secondary | ICD-10-CM

## 2024-07-27 DIAGNOSIS — L8990 Pressure ulcer of unspecified site, unspecified stage: Secondary | ICD-10-CM | POA: Insufficient documentation

## 2024-07-27 LAB — CBC
HCT: 27.5 % — ABNORMAL LOW (ref 39.0–52.0)
Hemoglobin: 9.5 g/dL — ABNORMAL LOW (ref 13.0–17.0)
MCH: 31.6 pg (ref 26.0–34.0)
MCHC: 34.5 g/dL (ref 30.0–36.0)
MCV: 91.4 fL (ref 80.0–100.0)
Platelets: 126 K/uL — ABNORMAL LOW (ref 150–400)
RBC: 3.01 MIL/uL — ABNORMAL LOW (ref 4.22–5.81)
RDW: 12 % (ref 11.5–15.5)
WBC: 4.1 K/uL (ref 4.0–10.5)
nRBC: 0 % (ref 0.0–0.2)

## 2024-07-27 LAB — BASIC METABOLIC PANEL WITH GFR
Anion gap: 13 (ref 5–15)
BUN: 13 mg/dL (ref 8–23)
CO2: 26 mmol/L (ref 22–32)
Calcium: 9.1 mg/dL (ref 8.9–10.3)
Chloride: 100 mmol/L (ref 98–111)
Creatinine, Ser: 0.91 mg/dL (ref 0.61–1.24)
GFR, Estimated: >60 mL/min (ref >60)
Glucose, Bld: 93 mg/dL (ref 70–99)
Potassium: 3.6 mmol/L (ref 3.5–5.1)
Sodium: 138 mmol/L (ref 135–145)

## 2024-07-27 MED ORDER — MIDODRINE HCL 5 MG PO TABS
5.0000 mg | ORAL_TABLET | Freq: Three times a day (TID) | ORAL | Status: DC
Start: 2024-07-27 — End: 2024-07-29
  Administered 2024-07-27 – 2024-07-29 (×6): 5 mg via ORAL
  Filled 2024-07-27 (×5): qty 1

## 2024-07-27 MED ORDER — ADULT MULTIVITAMIN W/MINERALS CH
1.0000 | ORAL_TABLET | Freq: Every day | ORAL | Status: DC
  Administered 2024-07-29: 1 via ORAL
  Filled 2024-07-27: qty 1

## 2024-07-27 MED ORDER — DEXTROSE-SODIUM CHLORIDE 5-0.45 % IV SOLN: INTRAVENOUS | Status: DC

## 2024-07-27 MED ORDER — ENSURE PLUS HIGH PROTEIN PO LIQD
237.0000 mL | Freq: Three times a day (TID) | ORAL | Status: DC
  Administered 2024-07-28 – 2024-07-29 (×3): 237 mL via ORAL

## 2024-07-27 NOTE — Progress Notes (Signed)
 Pt transferred to chair with PT. Did relatively well but needed a lot of encouragement and moderate assistance. Once up I attempted to give him some of his nectar thick juice. He pursed his lips to the cup so did ok there, but he held the drink in his mouth. I kept trying to coach him to swallow, even massaged his throat. He held it in his mouth for close to a minute then proceeded to spit it out all over himself.

## 2024-07-27 NOTE — Progress Notes (Signed)
 PROGRESS NOTE    Patient: Jay Rivas                            PCP: Duanne [place] DASEN, MD                    DOB: June 22, 1951            DOA: 07/25/2024 FMW:[id number]             DOS: 07/27/2024, 11:08 AM   LOS: 2 days   Date of Service: The patient was seen and examined on 07/27/2024  Subjective:   The patient was seen and examined this morning. Mildly hypertensive with blood pressure 109/62, satting 99% on 2 L of oxygen, otherwise hemodynamically stable Awake following command, per nursing staff pocketing food in his mouth Currently on dysphagia 1 diet  Brief Narrative:   Jay Rivas is a 73 y.o. male with medical history significant of dementia, Parkinson disease, hypertension, hyperlipidemia, GERD and seasonal rhinitis; who presented to the hospital from nursing home secondary to fever, general malaise and productive cough.  Symptoms have been present for the last 2 to 3 days and worsening.  At time of arrival to the ED patient requiring 2 L supplementation to maintain saturation (do not use oxygen at baseline) patient was febrile, tachypneic and tachycardic.  Urinalysis with concerns for the presence of a UTI and a chest x-ray not demonstrating acute infiltrates.   IV fluids provided, cultures taken and IV antibiotics initiated.  Patient received antipyretics and oxygen supplementation.  TRH has been contacted to place patient in the hospital for further evaluation and management.   Per family reports patient has been started on antiseizure medications about 1 month ago after having sudden seizure-like activities.   Respiratory panel by PCR demonstrated to be negative for COVID, influenza and RSV.    Assessment & Plan:   Principal Problem:   Sepsis (HCC)  Assessment and plan:  Sepsis: Present at time of admission - Improved sepsis physiology, hemodynamically stable satting 99% on 2 L of oxygen, mildly hypertensive with 109/62  - Appears to be in the setting of  pneumonia (most likely aspiration with underlying history of Parkinson) and presumed UTI  -Continue treatment with IV antibiotics - Follow culture results  - Improved fever spikes will continue antipyretics as needed - Patient with underlying history of penicillin allergy.    Difficulty swallowing/dysphagia - Patient with underlying swallowing difficulties in the setting of Parkinson disease and dementia -Per speech, can high risk for aspiration, continue dysphagia 1 diet  - Continue supportive care and IV medications.   -Per nursing staff pocketing food in his mouth -Still high risk for aspiration   History of dementia/Parkinson's - Aricept  was held, resuming now - Continue reorientation and supportive care.    Presumed seizure disorder - Continue IV Depakote for now - With plan to resume home oral medications when tolerating  Hypertension - Overall blood pressure is soft but stable; better. - Continue holding antihypertensive agent - Maintain adequate hydration - Follow vital signs.   - Midodrine has been ordered (waiting for availability with PO's meds).   Seasonal allergies - Continue DuoNeb - Planning to resume the use of Allegra  when able to tolerate p.o.'s.    DNR/DNI - Patient remains at high risk for decompensation - DNR/DNI 8 CODE STATUS has been confirmed - Appreciate assistance and recommendation by palliative care service; MOST form has  been completed. - Planning to continue to treat was treatable and follow patient response. - In case of further declining/decom    ----------------------------------------------------------------------------------------------------- Nutritional status:  The patient's BMI is: Body mass index is 22.32 kg/m. I agree with the assessment and plan as outlined  Skin Assessment: I have examined the patient's skin and I agree with the wound assessment as performed by wound care team As outlined belowe: Wound 07/25/24 1553  Pressure Injury Sacrum Bilateral Stage 1 -  Intact skin with non-blanchable redness of a localized area usually over a bony prominence. (Active)     ---------------------------------------------------------------------------------------------------------------------------------------------------- Cultures; Blood Cultures x 2 >> Urine Culture  >>>  Sputum Culture >>   ------------------------------------------------------------------------------------------------------------------------------------------------  DVT prophylaxis:     Code Status:   Code Status: Limited: Do not attempt resuscitation (DNR) -DNR-LIMITED -Do Not Intubate/DNI   Family Communication: No family member present at bedside-  -Advance care planning has been discussed.   Admission status:   Status is: Inpatient Remains inpatient appropriate because: Required treatment for sepsis, IV fluid, IV antibiotics   Disposition: From  - home             Planning for discharge in 1-2 days   Procedures:   No admission procedures for hospital encounter.   Antimicrobials:  Anti-infectives (From admission, onward)    Start     Dose/Rate Route Frequency Ordered Stop   07/26/24 1100  levofloxacin  (LEVAQUIN ) IVPB 750 mg        750 mg 100 mL/hr over 90 Minutes Intravenous Every 24 hours 07/25/24 1737     07/25/24 1100  levofloxacin  (LEVAQUIN ) IVPB 750 mg        750 mg 100 mL/hr over 90 Minutes Intravenous  Once 07/25/24 1054 07/25/24 1256        Medication:   budesonide (PULMICORT) nebulizer solution  0.5 mg Nebulization BID   Chlorhexidine  Gluconate Cloth  6 each Topical Daily   dextromethorphan-guaiFENesin  1 tablet Oral BID   folic acid   1 mg Intravenous Daily   midodrine  2.5 mg Oral TID WC    acetaminophen  **OR** acetaminophen , artificial tears, ipratropium-albuterol, ondansetron  **OR** ondansetron  (ZOFRAN ) IV, mouth rinse   Objective:   Vitals:   07/27/24 0400 07/27/24 0742 07/27/24 0800 07/27/24  0843  BP: 103/60  109/62   Pulse: 73  71   Resp: 19  18   Temp:  98.9 F (37.2 C)    TempSrc:  Axillary    SpO2: 96%  97% 99%  Weight:      Height:        Intake/Output Summary (Last 24 hours) at 07/27/2024 1108 Last data filed at 07/27/2024 0743 Gross per 24 hour  Intake 180.42 ml  Output 600 ml  Net -419.58 ml   Filed Weights   07/25/24 1041 07/25/24 1052 07/25/24 1553  Weight: 60.8 kg 62 kg 66.6 kg     Physical examination:   General:  AAO x 1,  no distress;   HEENT:  Normocephalic, PERRL, otherwise with in Normal limits   Neuro:  CNII-XII intact. , normal motor and sensation, reflexes intact   Lungs:   Diffuse rhonchi Rt > Lft.  negative for any wheezes , Respirations unlabored,  No wheezes / crackles  Cardio:    S1/S2, RRR, No murmure, No Rubs or Gallops   Abdomen:  Soft, non-tender, bowel sounds active all four quadrants, no guarding or peritoneal signs.  Muscular  skeletal:  Limited exam -global generalized weaknesses - in bed, able  to move all 4 extremities,   2+ pulses,  symmetric, No pitting edema  Skin:  Dry, warm to touch, negative for any Rashes,  Wounds: Please see nursing documentation  Wound 07/25/24 1553 Pressure Injury Sacrum Bilateral Stage 1 -  Intact skin with non-blanchable redness of a localized area usually over a bony prominence. (Active)       ------------------------------------------------------------------------------------------------------------------------------------------    LABs:     Latest Ref Rng & Units 07/27/2024    5:10 AM 07/26/2024    3:45 AM 07/25/2024   11:18 AM  CBC  WBC 4.0 - 10.5 K/uL 4.1  5.7  6.8   Hemoglobin 13.0 - 17.0 g/dL 9.5  89.5  88.9   Hematocrit 39.0 - 52.0 % 27.5  30.9  32.1   Platelets 150 - 400 K/uL 126  120  143       Latest Ref Rng & Units 07/27/2024    5:10 AM 07/26/2024    3:45 AM 07/25/2024   11:18 AM  CMP  Glucose 70 - 99 mg/dL 93  894  892   BUN 8 - 23 mg/dL 13  14  17    Creatinine 0.61  - 1.24 mg/dL 9.08  9.14  8.84   Sodium 135 - 145 mmol/L 138  136  135   Potassium 3.5 - 5.1 mmol/L 3.6  4.0  3.8   Chloride 98 - 111 mmol/L 100  97  96   CO2 22 - 32 mmol/L 26  27  24    Calcium  8.9 - 10.3 mg/dL 9.1  8.5  8.6   Total Protein 6.5 - 8.1 g/dL   6.5   Total Bilirubin 0.0 - 1.2 mg/dL   1.2   Alkaline Phos 38 - 126 U/L   55   AST 15 - 41 U/L   21   ALT 0 - 44 U/L   34        Micro Results Recent Results (from the past 240 hours)  Blood Culture (routine x 2)     Status: None (Preliminary result)   Collection Time: 07/25/24 11:18 AM   Specimen: BLOOD  Result Value Ref Range Status   Specimen Description BLOOD RIGHT ARM  Final   Special Requests   Final    BOTTLES DRAWN AEROBIC AND ANAEROBIC Blood Culture adequate volume   Culture   Final    NO GROWTH 2 DAYS Performed at [facility], [address], [place], [place] [address]    Report Status PENDING  Incomplete  Blood Culture (routine x 2)     Status: None (Preliminary result)   Collection Time: 07/25/24 11:23 AM   Specimen: BLOOD  Result Value Ref Range Status   Specimen Description BLOOD BLOOD LEFT ARM  Final   Special Requests   Final    BOTTLES DRAWN AEROBIC ONLY Blood Culture results may not be optimal due to an inadequate volume of blood received in culture bottles   Culture   Final    NO GROWTH 2 DAYS Performed at [facility], [address] [place], [place] [address]    Report Status PENDING  Incomplete  Resp panel by RT-PCR (RSV, Flu A&B, Covid)     Status: None   Collection Time: 07/25/24 11:31 AM  Result Value Ref Range Status   SARS Coronavirus 2 by RT PCR NEGATIVE NEGATIVE Final    Comment: (NOTE) SARS-CoV-2 target nucleic acids are NOT DETECTED.  The SARS-CoV-2 RNA is generally detectable in upper respiratory specimens during the acute  phase of infection. The lowest concentration of SARS-CoV-2 viral copies this assay can detect is 138 copies/mL. A negative result does not preclude  SARS-Cov-2 infection and should not be used as the sole basis for treatment or other patient management decisions. A negative result may occur with  improper specimen collection/handling, submission of specimen other than nasopharyngeal swab, presence of viral mutation(s) within the areas targeted by this assay, and inadequate number of viral copies(<138 copies/mL). A negative result must be combined with clinical observations, patient history, and epidemiological information. The expected result is Negative.  Fact Sheet for Patients:  BloggerCourse.com  Fact Sheet for Healthcare Providers:  SeriousBroker.it  This test is no t yet approved or cleared by the [place]  FDA and  has been authorized for detection and/or diagnosis of SARS-CoV-2 by FDA under an Emergency Use Authorization (EUA). This EUA will remain  in effect (meaning this test can be used) for the duration of the COVID-19 declaration under Section 564(b)(1) of the Act, 21 U.S.C.section 360bbb-3(b)(1), unless the authorization is terminated  or revoked sooner.       Influenza A by PCR NEGATIVE NEGATIVE Final   Influenza B by PCR NEGATIVE NEGATIVE Final    Comment: (NOTE) The Xpert Xpress SARS-CoV-2/FLU/RSV plus assay is intended as an aid in the diagnosis of influenza from Nasopharyngeal swab specimens and should not be used as a sole basis for treatment. Nasal washings and aspirates are unacceptable for Xpert Xpress SARS-CoV-2/FLU/RSV testing.  Fact Sheet for Patients: BloggerCourse.com  Fact Sheet for Healthcare Providers: SeriousBroker.it  This test is not yet approved or cleared by the [place]  FDA and has been authorized for detection and/or diagnosis of SARS-CoV-2 by FDA under an Emergency Use Authorization (EUA). This EUA will remain in effect (meaning this test can be used) for the duration of  the COVID-19 declaration under Section 564(b)(1) of the Act, 21 U.S.C. section 360bbb-3(b)(1), unless the authorization is terminated or revoked.     Resp Syncytial Virus by PCR NEGATIVE NEGATIVE Final    Comment: (NOTE) Fact Sheet for Patients: BloggerCourse.com  Fact Sheet for Healthcare Providers: SeriousBroker.it  This test is not yet approved or cleared by the [place]  FDA and has been authorized for detection and/or diagnosis of SARS-CoV-2 by FDA under an Emergency Use Authorization (EUA). This EUA will remain in effect (meaning this test can be used) for the duration of the COVID-19 declaration under Section 564(b)(1) of the Act, 21 U.S.C. section 360bbb-3(b)(1), unless the authorization is terminated or revoked.  Performed at [facility], [address], [place], [place] [address]   Urine Culture     Status: None   Collection Time: 07/25/24 11:41 AM   Specimen: Urine, Catheterized  Result Value Ref Range Status   Specimen Description   Final    URINE, CATHETERIZED Performed at [facility], [address] [place], [place] [address]    Special Requests   Final    NONE Reflexed from [id number] Performed at [facility], [address] [place], [place] [address]    Culture   Final    NO GROWTH Performed at [facility], [address], [place], [place] [address]    Report Status 07/26/2024 FINAL  Final  MRSA Next Gen by PCR, Nasal     Status: None   Collection Time: 07/25/24  4:00 PM   Specimen: Nasal Mucosa; Nasal Swab  Result Value Ref Range Status   MRSA by PCR Next Gen NOT DETECTED NOT  DETECTED Final    Comment: (NOTE) The GeneXpert MRSA Assay (FDA approved for NASAL specimens only), is one component of a comprehensive MRSA colonization surveillance program. It is not intended to diagnose MRSA infection nor to guide or monitor treatment for MRSA infections. Test performance is not FDA approved in  patients less than 74 years old. Performed at [facility], [address] [place], [place] [address]     Radiology Reports No results found.  SIGNED: Adriana DELENA Grams, MD, FHM. FAAFP. Jolynn Pack - [facility] Critical care time spent - 55 min.  In seeing, evaluating and examining the patient. Reviewing medical records, labs, drawn plan of care. [facility],  Pager (please use [email or web address] to page/ text) Please use Epic Secure Chat for non-urgent communication (7AM-7PM)  If 7PM-7AM, please contact night-coverage [email or web address], 07/27/2024, 11:08 AM

## 2024-07-27 NOTE — Progress Notes (Signed)
 Physical Therapy Treatment Patient Details Name: Jay Rivas MRN: [id number] DOB: 10-Jan-1951 Today's Date: 07/27/2024   History of Present Illness Jay Rivas is a 73 y.o. male with medical history significant of dementia, Parkinson disease, hypertension, hyperlipidemia, GERD and seasonal rhinitis; who presented to the hospital from nursing home secondary to fever, general malaise and productive cough.  Symptoms have been present for the last 2 to 3 days and worsening.  At time of arrival to the ED patient requiring 2 L supplementation to maintain saturation (do not use oxygen at baseline) patient was febrile, tachypneic and tachycardic.  Urinalysis with concerns for the presence of a UTI and a chest x-ray not demonstrating acute infiltrates    PT Comments  Patient agreeable for therapy. Patient requires slightly less assistance for sitting up at beside, once seated able to maintain sitting balance with occasional tactile assistance and tolerated taking a few side steps to transfer to chair. Patient tolerated sitting up in chair after therapy - RN aware. Patient will benefit from continued skilled physical therapy in hospital and recommended venue below to increase strength, balance, endurance for safe ADLs and gait.      If plan is discharge home, recommend the following: A lot of help with walking and/or transfers;A lot of help with bathing/dressing/bathroom;Help with stairs or ramp for entrance;Assist for transportation;Assistance with cooking/housework   Can travel by private vehicle        Equipment Recommendations  None recommended by PT    Recommendations for Other Services       Precautions / Restrictions Precautions Precautions: Fall Recall of Precautions/Restrictions: Impaired Restrictions Weight Bearing Restrictions Per Provider Order: No     Mobility  Bed Mobility Overal bed mobility: Needs Assistance Bed Mobility: Supine to Sit     Supine to sit: Mod assist, Max  assist     General bed mobility comments: increased time, labored movement    Transfers Overall transfer level: Needs assistance Equipment used: 1 person hand held assist Transfers: Sit to/from Stand, Bed to chair/wheelchair/BSC Sit to Stand: Mod assist   Step pivot transfers: Mod assist       General transfer comment: slow labored movement with buckling of knees due to weakness    Ambulation/Gait Ambulation/Gait assistance: Mod assist Gait Distance (Feet): 5 Feet Assistive device: 1 person hand held assist Gait Pattern/deviations: Decreased step length - right, Decreased step length - left, Decreased stride length, Shuffle, Knees buckling Gait velocity: slow     General Gait Details: limited to a few slow labored unsteady side steps before having to sit due to fatigue with buckling of knees   Stairs             Wheelchair Mobility     Tilt Bed    Modified Rankin (Stroke Patients Only)       Balance Overall balance assessment: Needs assistance Sitting-balance support: Feet supported, No upper extremity supported Sitting balance-Leahy Scale: Poor Sitting balance - Comments: fair/poor seated at EOB   Standing balance support: During functional activity, No upper extremity supported Standing balance-Leahy Scale: Poor Standing balance comment: hand held assist                            Communication Communication Communication: Impaired Factors Affecting Communication: Difficulty expressing self  Cognition Arousal: Alert Behavior During Therapy: [facility] for tasks assessed/performed  Following commands: Intact Following commands impaired: Follows one step commands with increased time    Cueing Cueing Techniques: Verbal cues, Tactile cues  Exercises      General Comments        Pertinent Vitals/Pain Pain Assessment Pain Assessment: No/denies pain    Home Living                           Prior Function            PT Goals (current goals can now be found in the care plan section) Acute Rehab PT Goals Patient Stated Goal: return home PT Goal Formulation: With patient/family Time For Goal Achievement: 07/30/24 Potential to Achieve Goals: Good Progress towards PT goals: Progressing toward goals    Frequency    Min 3X/week      PT Plan      Co-evaluation              AM-PAC PT 6 Clicks Mobility   Outcome Measure  Help needed turning from your back to your side while in a flat bed without using bedrails?: A Lot Help needed moving from lying on your back to sitting on the side of a flat bed without using bedrails?: A Lot Help needed moving to and from a bed to a chair (including a wheelchair)?: A Lot Help needed standing up from a chair using your arms (e.g., wheelchair or bedside chair)?: A Lot Help needed to walk in hospital room?: A Lot Help needed climbing 3-5 steps with a railing? : Total 6 Click Score: 11    End of Session   Activity Tolerance: Patient tolerated treatment well;Patient limited by fatigue Patient left: in chair;with call bell/phone within reach;with chair alarm set Nurse Communication: Mobility status PT Visit Diagnosis: Unsteadiness on feet (R26.81);Other abnormalities of gait and mobility (R26.89);Muscle weakness (generalized) (M62.81);Apraxia (R48.2)     Time: 8970-8950 PT Time Calculation (min) (ACUTE ONLY): 20 min  Charges:    $Therapeutic Activity: 8-22 mins PT General Charges $$ ACUTE PT VISIT: 1 Visit                     2:59 PM, 07/27/24 Lynwood Music, MPT Physical Therapist with [facility] [phone] office [phone] mobile phone

## 2024-07-27 NOTE — Progress Notes (Signed)
 Initial Nutrition Assessment  DOCUMENTATION CODES:   Not applicable  INTERVENTION:   -Continue dysphagia 1 diet with nectar thick liquids -Ensure Plus High Protein po TID, each supplement provides 350 kcal and 20 grams of protein  -Magic cup TID with meals, each supplement provides 290 kcal and 9 grams of protein  -MVI with minerals daily -Feeding assistance with meals -If enteral support is desired, recommend:  Initiate Osmolite 1.2 @ 20 ml/hr and increase by 10 ml every 12 hours to goal rate of 60 ml/hr.   Tube feeding regimen provides 1728 kcal (100% of needs), 80 grams of protein, and 1181 ml of H2O.    -Monitor Mg, K, and Phos and replete as needed secondary to high refeeding risk -100 mg thiamine daily x 7 days   NUTRITION DIAGNOSIS:   Inadequate oral intake related to poor appetite as evidenced by per patient/family report.  GOAL:   Patient will meet greater than or equal to 90% of their needs  MONITOR:   PO intake, Supplement acceptance  REASON FOR ASSESSMENT:   Rounds    ASSESSMENT:   Pt with medical history significant of dementia, Parkinson disease, hypertension, hyperlipidemia, GERD and seasonal rhinitis; who presented to the hospital from nursing home secondary to fever, general malaise and productive cough.  Pt admitted with sepsis in the setting or pneumonia and UTI.   9/30- s/p BSE- dysphagia 1 with nectar thick liquids  Reviewed I/O's: -420 ml x 24 hours and -1.7 L since admission  UOP: 600 ml x 24 hours  Case discussed in interdisciplinary rounds.   Per MD, pt with minimal intake. Noted meal completions 5-50%.   Per palliative care, pt is struggling with goals of care to allow time for outcomes. Pt unsure if they desire PEG or NGT. Continuing to follow for goals of care discussions. RD would be hesitant to recommend nutrition support secondary to Parkinson's and dementia; this would not enhance pt's quality of life.   Pt currently on a  dysphagia 1 diet with nectar thick liquids. Noted meal completions 5-50%.   Per TOC notes, pt was a resident of [facility].   No wt loss noted over the past 3 months.   Medications reviewed and include folic acid , midodrine, and dextrose  5% and 0.45% NaCl infusion @ 100 ml/hr.   Labs reviewed.    Diet Order:   Diet Order             DIET - DYS 1 Room service appropriate? Yes; Fluid consistency: Nectar Thick  Diet effective now                   EDUCATION NEEDS:   No education needs have been identified at this time  Skin:  Skin Assessment: Skin Integrity Issues: Skin Integrity Issues:: Stage I Stage I: sacrum  Last BM:  Unknown  Height:   Ht Readings from Last 1 Encounters:  07/25/24 5' 8 (1.727 m)    Weight:   Wt Readings from Last 1 Encounters:  07/25/24 66.6 kg    Ideal Body Weight:  70 kg  BMI:  Body mass index is 22.32 kg/m.  Estimated Nutritional Needs:   Kcal:  1650-1850  Protein:  85-100 grams  Fluid:  1.6-1.8 L    Margery ORN, RD, LDN, CDCES Registered Dietitian III Certified Diabetes Care and Education Specialist If unable to reach this RD, please use RD Inpatient group chat on secure chat between hours of 8am-4 pm daily

## 2024-07-27 NOTE — Hospital Course (Addendum)
 Jay Rivas is a 73 y.o. male with medical history significant of dementia, Parkinson disease, hypertension, hyperlipidemia, GERD and seasonal rhinitis; who presented to the hospital from nursing home secondary to fever, general malaise and productive cough.  Symptoms have been present for the last 2 to 3 days and worsening.  At time of arrival to the ED patient requiring 2 L supplementation to maintain saturation (do not use oxygen at baseline) patient was febrile, tachypneic and tachycardic.  Urinalysis with concerns for the presence of a UTI and a chest x-ray not demonstrating acute infiltrates.   IV fluids provided, cultures taken and IV antibiotics initiated.  Patient received antipyretics and oxygen supplementation.  TRH has been contacted to place patient in the hospital for further evaluation and management.   Per family reports patient has been started on antiseizure medications about 1 month ago after having sudden seizure-like activities.   Respiratory panel by PCR demonstrated to be negative for COVID, influenza and RSV.    Assessment & Plan:   Principal Problem:   Sepsis (HCC)  Assessment and plan:  Sepsis: Met sepsis criteria on admission  -Still hypotensive, but BP stabilized with midodrine 102/51, satting 96% on 2 L of oxygen, otherwise hemodynamically stable - Overall improving SIRS physiology  - Appears to be in the setting of pneumonia (most likely aspiration with underlying history of Parkinson) and presumed UTI  -Continue treatment with IV antibiotics (h/o penicillin allergy - Follow culture results    Toxic metabolic encephalopathy with underlying dementia  Mentation remains poor only oriented to self, following commands  -Obtaining MRI of the brain  Treating underlying cause of infection   difficulty swallowing/dysphagia - Patient with underlying swallowing difficulties in the setting of Parkinson disease and dementia -Per speech, can high risk for  aspiration, continue Dysphagia 1 diet  - Continue supportive care and IV medications.   -Per nursing staff pocketing food in his mouth -Still high risk for aspiration   History of dementia/Parkinson's - Aricept  was held, resuming now - Continue reorientation and supportive care.    Presumed seizure disorder - Continue IV Depakote for now--will try to switch her to p.o.   Hypertension - Overall blood pressure is soft but stable; better. - Continue home BP meds, encouraged oral hydration    - Midodrine has been initiated, will continue with current dose   Seasonal allergies - Continue DuoNeb - Planning to resume the use of Allegra  when able to tolerate p.o.'s.    DNR/DNI - Patient remains at high risk for decompensation - DNR/DNI 8 CODE STATUS has been confirmed - Appreciate assistance and recommendation by palliative care service; MOST form has been completed. - Planning to continue to treat was treatable and follow patient response. - In case of further declining/decom

## 2024-07-28 ENCOUNTER — Inpatient Hospital Stay (HOSPITAL_COMMUNITY)

## 2024-07-28 DIAGNOSIS — I9589 Other hypotension: Secondary | ICD-10-CM | POA: Diagnosis not present

## 2024-07-28 DIAGNOSIS — Z7189 Other specified counseling: Secondary | ICD-10-CM | POA: Diagnosis not present

## 2024-07-28 DIAGNOSIS — R4182 Altered mental status, unspecified: Secondary | ICD-10-CM | POA: Diagnosis not present

## 2024-07-28 DIAGNOSIS — A419 Sepsis, unspecified organism: Secondary | ICD-10-CM | POA: Diagnosis not present

## 2024-07-28 DIAGNOSIS — R1310 Dysphagia, unspecified: Secondary | ICD-10-CM | POA: Diagnosis not present

## 2024-07-28 DIAGNOSIS — N390 Urinary tract infection, site not specified: Secondary | ICD-10-CM | POA: Diagnosis not present

## 2024-07-28 DIAGNOSIS — Z515 Encounter for palliative care: Secondary | ICD-10-CM | POA: Diagnosis not present

## 2024-07-28 LAB — VITAMIN B12: Vitamin B-12: 1438 pg/mL — ABNORMAL HIGH (ref 180–914)

## 2024-07-28 LAB — BASIC METABOLIC PANEL WITH GFR
Anion gap: 11 (ref 5–15)
BUN: 10 mg/dL (ref 8–23)
CO2: 26 mmol/L (ref 22–32)
Calcium: 8.9 mg/dL (ref 8.9–10.3)
Chloride: 100 mmol/L (ref 98–111)
Creatinine, Ser: 0.85 mg/dL (ref 0.61–1.24)
GFR, Estimated: >60 mL/min (ref >60)
Glucose, Bld: 109 mg/dL — ABNORMAL HIGH (ref 70–99)
Potassium: 3.4 mmol/L — ABNORMAL LOW (ref 3.5–5.1)
Sodium: 137 mmol/L (ref 135–145)

## 2024-07-28 LAB — CBC
HCT: 25 % — ABNORMAL LOW (ref 39.0–52.0)
Hemoglobin: 8.7 g/dL — ABNORMAL LOW (ref 13.0–17.0)
MCH: 31.6 pg (ref 26.0–34.0)
MCHC: 34.8 g/dL (ref 30.0–36.0)
MCV: 90.9 fL (ref 80.0–100.0)
Platelets: 140 K/uL — ABNORMAL LOW (ref 150–400)
RBC: 2.75 MIL/uL — ABNORMAL LOW (ref 4.22–5.81)
RDW: 12 % (ref 11.5–15.5)
WBC: 3.4 K/uL — ABNORMAL LOW (ref 4.0–10.5)
nRBC: 0 % (ref 0.0–0.2)

## 2024-07-28 LAB — IRON AND TIBC
Iron: 31 ug/dL — ABNORMAL LOW (ref 45–182)
Saturation Ratios: 16 % — ABNORMAL LOW (ref 17.9–39.5)
TIBC: 189 ug/dL — ABNORMAL LOW (ref 250–450)
UIBC: 158 ug/dL

## 2024-07-28 LAB — T4, FREE: Free T4: 1.34 ng/dL — ABNORMAL HIGH (ref 0.61–1.12)

## 2024-07-28 LAB — FOLATE: Folate: >20 ng/mL (ref >5.9)

## 2024-07-28 LAB — OCCULT BLOOD X 1 CARD TO LAB, STOOL: Fecal Occult Bld: NEGATIVE

## 2024-07-28 MED ORDER — DEXTROSE-SODIUM CHLORIDE 5-0.45 % IV SOLN: INTRAVENOUS | Status: AC

## 2024-07-28 MED ORDER — DIVALPROEX SODIUM 125 MG PO DR TAB
125.0000 mg | DELAYED_RELEASE_TABLET | Freq: Two times a day (BID) | ORAL | Status: DC
  Administered 2024-07-29: 125 mg via ORAL
  Filled 2024-07-28 (×4): qty 1

## 2024-07-28 NOTE — Plan of Care (Signed)
  Problem: Pain Managment: Goal: General experience of comfort will improve and/or be controlled Outcome: Progressing   Problem: Safety: Goal: Ability to remain free from injury will improve Outcome: Progressing

## 2024-07-28 NOTE — Progress Notes (Signed)
 Nutrition Follow-up  DOCUMENTATION CODES:   Not applicable  INTERVENTION:   -Continue dysphagia 1 diet with nectar thick liquids -Continue Ensure Plus High Protein po TID, each supplement provides 350 kcal and 20 grams of protein  -Continue Magic cup TID with meals, each supplement provides 290 kcal and 9 grams of protein  -Continue MVI with minerals daily -Continue feeding assistance with meals  NUTRITION DIAGNOSIS:   Inadequate oral intake related to poor appetite as evidenced by per patient/family report.  Ongoing  GOAL:   Patient will meet greater than or equal to 90% of their needs  Progressing   MONITOR:   PO intake, Supplement acceptance  REASON FOR ASSESSMENT:   Rounds    ASSESSMENT:   Pt with medical history significant of dementia, Parkinson disease, hypertension, hyperlipidemia, GERD and seasonal rhinitis; who presented to the hospital from nursing home secondary to fever, general malaise and productive cough.  9/30- s/p BSE- dysphagia 1 with nectar thick liquids   Reviewed I/O's: +1.4 L x 24 hours and +3.1 L since admission  UOP: 750 ml x 24 hours  Case discussed in interdisciplinary care rounds.   Palliative care following; planning to continue to treat was treatable and follow patient response. Family states pt is more alert and eating better. Plan to discharge back to care home tomorrow.   Labs reviewed: K: 3.4.    Diet Order:   Diet Order             DIET - DYS 1 Fluid consistency: Thin  Diet effective now                   EDUCATION NEEDS:   No education needs have been identified at this time  Skin:  Skin Assessment: Skin Integrity Issues: Skin Integrity Issues:: Stage I Stage I: sacrum  Last BM:  Unknown  Height:   Ht Readings from Last 1 Encounters:  07/25/24 5' 8 (1.727 m)    Weight:   Wt Readings from Last 1 Encounters:  07/25/24 66.6 kg    Ideal Body Weight:  70 kg  BMI:  Body mass index is 22.32  kg/m.  Estimated Nutritional Needs:   Kcal:  1650-1850  Protein:  80-100 grams  Fluid:  1.6-1.8 L    Margery ORN, RD, LDN, CDCES Registered Dietitian III Certified Diabetes Care and Education Specialist If unable to reach this RD, please use RD Inpatient group chat on secure chat between hours of 8am-4 pm daily

## 2024-07-28 NOTE — Progress Notes (Signed)
 Daily Progress Note   Patient Name: Jay Rivas       Date: 07/28/2024 DOB: October 23, 1951  Age: 73 y.o. MRN#: [id number] Attending Physician: Willette Adriana LABOR, MD Primary Care Physician: Duanne Butler DASEN, MD Admit Date: 07/25/2024  Reason for Consultation/Follow-up: Establishing goals of care  Subjective:   73 y.o. male  with past medical history of cortical basilar degenerative disorder/parkinsonian syndrome, seizures, dementia, hypertensive, history of MI, spinal stenosis admitted on 07/25/2024 with fever, malaise, and productive cough.    Workup revealed sepsis felt to be secondary to likely aspiration pneumonia and UTI.  He was started on antibiotics, bronchodilators, and supplemental O2.  He was also given IV fluids.  Noted to have some declines in swallowing and functional status.  Therefore, patient is followed by PT/OT/ST.  07/25/2024: Initial visit completed .  Unable to speak with family but did learn quite a bit about patient from patient's care home representative as well as reviewing records.  07/26/2024: Met with patient's son and son's wife. DNR/DNI confirmed.  Plan was to continue to treat the treatable and allow time for outcomes.  Today, labs independently reviewed.  Some mild hypokalemia noted.  Renal function is stable/within normal limits.  Glucose level mildly elevated although question if this was a fasting level.  Would recommend continuing to trend and replete potassium as needed.CBC reviewed.  White blood count slightly low at 3.4.  Hemoglobin downtrending as well with reading today of 8.7 (9.5<<10.4<<11).  Platelet count improved from yesterday but continues to be mildly low.  Recently treated for sepsis.  Would recommend continue to trend and transfuse as needed for hemoglobin less than 7 g/dL.  Vital signs reviewed  and appear relatively stable.  Medication administration record reviewed.  No as needed symptom meds administered on 24-hour look back.  Patient working with therapy and has had made some improvements although somewhat slower than expected.  MRI of the brain ordered to rule out underlying neurologic cause given steep decline of functional/swallowing status.  Per therapy notes, patient has made some progress although still needing a lot of assistance.  Recommend a minimum of therapy 3 times a week minimum.  Today, patient and family (sister) at bedside.  They feel he is doing better today and more alert.  He was able to eat well at lunch.  Patient states that he is also feeling better.  No pain reported.  He is ready to get back to his care home.  Independent history also obtained from nursing and family as patient does have some difficulty expressing himself.  Per nursing, he has some good days  and bad days but seems to be doing a bit better.  Sometimes pocketing food and other times will swallow without difficulty.  No significant nursing concerns reported at this time. Spoke with patient's son by phone. Patient's son states that he appears to be doing much better today cognitively and is back to his baseline.  Provided update and discussed plan.  His son is in agreement with the plan to discharge back to his care home with therapy and outpatient palliative support (aware that patient does have a neurodegenerative disorder and further decline is expected/therapy progress may be for). Questions and concerns were addressed.  The family was encouraged to call with questions or concerns.  PMT will continue to support holistically.  Chart review/care coordination:  Completed extensive chart review including EPIC notes, labs (independently reviewed), medication administration record, and vital signs. Coordinated care with attending physician, bedside nursing staff, and TOC.    Length of Stay: 3   Physical  Exam Constitutional:      General: He is not in acute distress. Pulmonary:     Effort: Pulmonary effort is normal. No respiratory distress.  Skin:    General: Skin is warm and dry.  Neurological:     Mental Status: He is alert.     Comments: More alert and responsive today, answers questions             Vital Signs: BP (!) 109/96   Pulse 72   Temp 97.6 F (36.4 C) (Axillary)   Resp 20   Ht 5' 8 (1.727 m)   Wt 66.6 kg   SpO2 97%   BMI 22.32 kg/m  SpO2: SpO2: 97 % O2 Device: O2 Device: Room Air O2 Flow Rate: O2 Flow Rate (L/min): 2 L/min      Palliative Assessment/Data: 50%   Palliative Care Assessment & Plan   Patient Profile/Assessment:  73 year old male with pertinent medical history of seizures and corticobasal degenerative disorder admitted for sepsis secondary to presumed aspiration pneumonia. Goals of care and advance directive discussion completed with patient and family. Confirmed: DNR/DNI. Plan: treat the treatable. Continue PT/OT/ST inpatient and follow therapy recs at discharge. Patient and family's goal is for him to improve enough to return to his care home (preferably) where he can receive additional care and outpatient therapy if needed. For further decline or prognosis were to worsen, family would consider comfort measures.    Recommendations/Plan:  DNR/DNI Continue to treat the treatable Continue ST/PT/OT inpatient until discharge Once medically stable hopeful to transition back to his residential care home with OP palliative and therapy support Monitor for declines and continue to address goals of care based off of patient's status. Palliative medicine team will continue to follow for ongoing goals of care discussion, symptom management, and coordination of care.   Symptom management:  Symptoms stable at present, therefore continue symptom regimen per admitting team with PMT available as needed for support  Prognosis: Guarded,  improving    Discharge Planning: To Be Determined, likely back to his family care home with outpatient palliative (already established) and outpatient therapy follow-up    Detailed review of medical records (labs, imaging, vital signs), medically appropriate exam, discussed with treatment team, counseling and education to patient, family, & staff, documenting clinical information, medication management, coordination of care    Billing based on MDM: High  Problems Addressed: One acute or chronic illness or injury that poses a threat to life or bodily function  Amount and/or Complexity of Data: Category 1:Assessment requiring  an independent historian(s) and Category 3:Discussion of management or test interpretation with external physician/other qualified health care professional/appropriate source (not separately reported)  Risks: n/a         Laymon CHRISTELLA Pinal, NP  Palliative Medicine Team Team phone # [phone]  Thank you for allowing the Palliative Medicine Team to assist in the care of this patient. Please utilize secure chat with additional questions, if there is no response within 30 minutes please call the above phone number.  Palliative Medicine Team providers are available by phone from 7am to 7pm daily and can be reached through the team cell phone.  Should this patient require assistance outside of these hours, please call the patient's attending physician.

## 2024-07-28 NOTE — Progress Notes (Signed)
 Occupational Therapy Treatment Patient Details Name: Jay Rivas MRN: [id number] DOB: 1951-09-21 Today's Date: 07/28/2024   History of present illness Jay Rivas is a 73 y.o. male with medical history significant of dementia, Parkinson disease, hypertension, hyperlipidemia, GERD and seasonal rhinitis; who presented to the hospital from nursing home secondary to fever, general malaise and productive cough.  Symptoms have been present for the last 2 to 3 days and worsening.  At time of arrival to the ED patient requiring 2 L supplementation to maintain saturation (do not use oxygen at baseline) patient was febrile, tachypneic and tachycardic.  Urinalysis with concerns for the presence of a UTI and a chest x-ray not demonstrating acute infiltrates   OT comments  Pt agreeable to OT and PT co-treatment. Pt demonstrates similar level of assist for bed mobility but improved balance for sitting and standing compared to OT evaluation. Pt able to stand for ~30 seconds with R UE support by this OT and PT anteriorly supporting as well. Pt also took lateral steps to L and R side a couple different times today. Pt left in the bed with RN present to assist pt with recent bowel movement. Pt will benefit from continued OT in the hospital to increase strength, balance, and endurance for safe ADL's.         If plan is discharge home, recommend the following:  Two people to help with walking and/or transfers;A lot of help with walking and/or transfers;A lot of help with bathing/dressing/bathroom;Assistance with cooking/housework;Assistance with feeding;Two people to help with bathing/dressing/bathroom;Direct supervision/assist for medications management;Assist for transportation;Help with stairs or ramp for entrance   Equipment Recommendations  None recommended by OT          Precautions / Restrictions Precautions Precautions: Fall Recall of Precautions/Restrictions: Impaired Restrictions Weight Bearing  Restrictions Per Provider Order: No       Mobility Bed Mobility Overal bed mobility: Needs Assistance Bed Mobility: Supine to Sit, Sit to Supine     Supine to sit: Max assist Sit to supine: Max assist   General bed mobility comments: increased time, labored movement; tremors/jerking    Transfers Overall transfer level: Needs assistance Equipment used: 1 Rivas hand held assist Transfers: Sit to/from Stand Sit to Stand: Mod assist           General transfer comment: ~2 to 3 reps of sit to stand from EOB with PT providing most physical assist. OT just grasping pt's L UE more slight support and to limit jerking during steps. Pt also able to stand in place with minimal tremors for ~30 seconds today.     Balance Overall balance assessment: Needs assistance Sitting-balance support: Feet supported, Bilateral upper extremity supported Sitting balance-Leahy Scale: Poor Sitting balance - Comments: fair/poor seated at EOB   Standing balance support: During functional activity, Single extremity supported Standing balance-Leahy Scale: Poor Standing balance comment: hand held assist                           ADL either performed or assessed with clinical judgement   ADL                           Toilet Transfer: Moderate assistance;Stand-pivot;Maximal assistance Toilet Transfer Details (indicate cue type and reason): Partially simulated today via sit to stand and steps at EOB with mod to max A.           General ADL  Comments: Pt able to take lateral steps to L and R side beside bed. More difficulty noted to R side with shorter steps and more time needed.    Extremity/Trunk Assessment Upper Extremity Assessment Upper Extremity Assessment:  (Tremors/jerking with active movement.)                             Communication Communication Communication: Impaired Factors Affecting Communication: Difficulty expressing self   Cognition Arousal:  Alert Behavior During Therapy: WFL for tasks assessed/performed Cognition: History of cognitive impairments                               Following commands: Intact Following commands impaired: Follows one step commands with increased time      Cueing   Cueing Techniques: Verbal cues, Tactile cues  Exercises                   Pertinent Vitals/ Pain       Pain Assessment Pain Assessment: Faces Faces Pain Scale: No hurt                                                          Frequency  Min 3X/week        Progress Toward Goals  OT Goals(current goals can now be found in the care plan section)  Progress towards OT goals: Progressing toward goals  Acute Rehab OT Goals Patient Stated Goal: return to facility OT Goal Formulation: With family Time For Goal Achievement: 08/09/24 Potential to Achieve Goals: Fair ADL Goals Pt Will Perform Grooming: with min assist;sitting Pt Will Perform Upper Body Dressing: with min assist;with mod assist;sitting Pt Will Transfer to Toilet: with mod assist;with min assist;ambulating Pt/caregiver will Perform Home Exercise Program: Increased ROM;Both right and left upper extremity;With minimal assist  Plan      Co-evaluation    PT/OT/SLP Co-Evaluation/Treatment: Yes Reason for Co-Treatment: To address functional/ADL transfers;Complexity of the patient's impairments (multi-system involvement)   OT goals addressed during session: ADL's and self-care                          End of Session Equipment Utilized During Treatment: Gait belt  OT Visit Diagnosis: Unsteadiness on feet (R26.81);Other abnormalities of gait and mobility (R26.89);Muscle weakness (generalized) (M62.81);Apraxia (R48.2);Other symptoms and signs involving cognitive function;Cognitive communication deficit (R41.841)   Activity Tolerance Patient tolerated treatment well   Patient Left in bed;with call bell/phone  within reach;with nursing/sitter in room   Nurse Communication Other (comment) (Notified that pt had a bowel movement.)        Time: 0950-1005 OT Time Calculation (min): 15 min  Charges: OT General Charges $OT Visit: 1 Visit (No timed charge due to co-treat with PT.)  Jay Rivas OT, MOT  Jay Rivas 07/28/2024, 10:36 AM

## 2024-07-28 NOTE — Progress Notes (Signed)
 Physical Therapy Treatment Patient Details Name: Jay Rivas MRN: [id number] DOB: [birth year] Today's Date: 07/28/2024   History of Present Illness HUBBARD SELDON is a 73 y.o. male with medical history significant of dementia, Parkinson disease, hypertension, hyperlipidemia, GERD and seasonal rhinitis; who presented to the hospital from nursing home secondary to fever, general malaise and productive cough.  Symptoms have been present for the last 2 to 3 days and worsening.  At time of arrival to the ED patient requiring 2 L supplementation to maintain saturation (do not use oxygen at baseline) patient was febrile, tachypneic and tachycardic.  Urinalysis with concerns for the presence of a UTI and a chest x-ray not demonstrating acute infiltrates    PT Comments  Patient demonstrates slow labored movement for sitting up at bedside and once seated had frequent episodes of shaking of extremities especially when initiating functional activities to include scooting to EOB or standing with hand held assist. Patient tolerated taking a few side steps left/right before having to sit due to fatigue and poor standing balance. Patient to be taken to a procedure and put back to bed with Max assist for repositioning. Patient will benefit from continued skilled physical therapy in hospital and recommended venue below to increase strength, balance, endurance for safe ADLs and gait.    If plan is discharge home, recommend the following: A lot of help with walking and/or transfers;A lot of help with bathing/dressing/bathroom;Help with stairs or ramp for entrance;Assist for transportation;Assistance with cooking/housework   Can travel by private vehicle        Equipment Recommendations  None recommended by PT    Recommendations for Other Services       Precautions / Restrictions Precautions Precautions: Fall Recall of Precautions/Restrictions: Impaired Restrictions Weight Bearing Restrictions Per Provider  Order: No     Mobility  Bed Mobility Overal bed mobility: Needs Assistance Bed Mobility: Supine to Sit, Sit to Supine     Supine to sit: Max assist Sit to supine: Max assist   General bed mobility comments: increased time, labored movement; tremors/jerking in extremities    Transfers Overall transfer level: Needs assistance Equipment used: 1 person hand held assist Transfers: Sit to/from Stand Sit to Stand: Mod assist           General transfer comment: patient has severe shakiness in extremities when initiating sit to stands    Ambulation/Gait Ambulation/Gait assistance: Mod assist, Max assist Gait Distance (Feet): 5 Feet Assistive device: 1 person hand held assist Gait Pattern/deviations: Decreased step length - right, Decreased step length - left, Decreased stride length, Shuffle, Knees buckling Gait velocity: slow     General Gait Details: limited to a few side steps left and right due buckling of knees and near loss of balance due to trembling/shakiness of legs   Stairs             Wheelchair Mobility     Tilt Bed    Modified Rankin (Stroke Patients Only)       Balance Overall balance assessment: Needs assistance Sitting-balance support: Feet supported, Bilateral upper extremity supported Sitting balance-Leahy Scale: Poor Sitting balance - Comments: fair/poor seated at EOB   Standing balance support: During functional activity, Single extremity supported Standing balance-Leahy Scale: Poor Standing balance comment: hand held assist                            Communication Communication Communication: Impaired Factors Affecting Communication: Difficulty expressing self  Cognition Arousal: Alert Behavior During Therapy: WFL for tasks assessed/performed                             Following commands: Intact      Cueing Cueing Techniques: Verbal cues, Tactile cues  Exercises      General Comments         Pertinent Vitals/Pain Pain Assessment Pain Assessment: No/denies pain    Home Living                          Prior Function            PT Goals (current goals can now be found in the care plan section) Acute Rehab PT Goals Patient Stated Goal: return home Time For Goal Achievement: 07/30/24 Potential to Achieve Goals: Good Progress towards PT goals: Progressing toward goals    Frequency    Min 3X/week      PT Plan      Co-evaluation PT/OT/SLP Co-Evaluation/Treatment: Yes Reason for Co-Treatment: To address functional/ADL transfers;Complexity of the patient's impairments (multi-system involvement) PT goals addressed during session: Mobility/safety with mobility;Balance OT goals addressed during session: ADL's and self-care      AM-PAC PT 6 Clicks Mobility   Outcome Measure  Help needed turning from your back to your side while in a flat bed without using bedrails?: A Lot Help needed moving from lying on your back to sitting on the side of a flat bed without using bedrails?: A Lot Help needed moving to and from a bed to a chair (including a wheelchair)?: A Lot Help needed standing up from a chair using your arms (e.g., wheelchair or bedside chair)?: A Lot Help needed to walk in hospital room?: A Lot Help needed climbing 3-5 steps with a railing? : Total 6 Click Score: 11    End of Session   Activity Tolerance: Patient tolerated treatment well;Patient limited by fatigue Patient left: in bed;with call bell/phone within reach Nurse Communication: Mobility status PT Visit Diagnosis: Unsteadiness on feet (R26.81);Other abnormalities of gait and mobility (R26.89);Muscle weakness (generalized) (M62.81);Apraxia (R48.2)     Time: 9054-8994 PT Time Calculation (min) (ACUTE ONLY): 20 min  Charges:    $Therapeutic Activity: 8-22 mins PT General Charges $$ ACUTE PT VISIT: 1 Visit                     1:55 PM, 07/28/24 Lynwood Music, MPT Physical  Therapist with [facility] [address] [phone] office [phone] mobile phone

## 2024-07-28 NOTE — Progress Notes (Signed)
 Speech Language Pathology Treatment: Dysphagia  Patient Details Name: Jay Rivas MRN: [id number] DOB: 1950-12-31 Today's Date: 07/28/2024 Time: 9044-8979 SLP Time Calculation (min) (ACUTE ONLY): 25 min  Assessment / Plan / Recommendation Clinical Impression  Pt seen for ongoing dysphagia intervention following BSE completed earlier this week. Pt is more alert today and CNA reports that Pt ate all of his lunch yesterday, but help food in his mouth at breakfast yesterday. Pt with strong cognitive component to his dysphagia and PO intake (safety and efficiency) will fluctuate given altered mental status. This session, Pt accepted ice chips, thin water via straw sips, chocolate boost via straw sips, and a container of applesauce without oral holding and no signs of reduced airway protection. Recommend continue D1/puree and advance to thin liquids and ok for straw sips to help position his head looking down to drink and Pt must be alert and upright, po medications whole in puree or crushed as able. Pt intake will depend on his cognition and he may end up skipping some meals due to this. SLP will continue to follow in acute setting.    HPI HPI: Jay Rivas is a 73 y.o. male with medical history significant of dementia, Parkinson disease, hypertension, hyperlipidemia, GERD and seasonal rhinitis; who presented to the hospital from nursing home secondary to fever, general malaise and productive cough.  Symptoms have been present for the last 2 to 3 days and worsening.  At time of arrival to the ED patient requiring 2 L supplementation to maintain saturation (do not use oxygen at baseline) patient was febrile, tachypneic and tachycardic.  Urinalysis with concerns for the presence of a UTI and a chest x-ray not demonstrating acute infiltrates.     ST consulted for swallow evaluation.      SLP Plan  Continue with current plan of care          Recommendations  Diet recommendations: Dysphagia 1  (puree);Thin liquid Liquids provided via: Cup;Straw Medication Administration: Whole meds with puree Supervision: Staff to assist with self feeding;Full supervision/cueing for compensatory strategies Compensations: Slow rate;Small sips/bites;Minimize environmental distractions Postural Changes and/or Swallow Maneuvers: Seated upright 90 degrees;Upright 30-60 min after meal                  Oral care BID;Staff/trained caregiver to provide oral care   Frequent or constant Supervision/Assistance Dysphagia, oropharyngeal phase (R13.12)     Continue with current plan of care    Thank you,  Lamar Candy, CCC-SLP [phone]  Sharod Petsch  07/28/2024, 12:30 PM

## 2024-07-28 NOTE — Progress Notes (Addendum)
 PROGRESS NOTE    Patient: Jay Rivas                            PCP: Duanne Butler DASEN, MD                    DOB: 09/15/1951            DOA: 07/25/2024 FMW:[id number]             DOS: 07/28/2024, 11:08 AM   LOS: 3 days   Date of Service: The patient was seen and examined on 07/28/2024  Subjective:   The patient was seen and examined this morning, no event otherwise arousable Blood pressure improved 102/51, satting 96% on 2 L of oxygen Currently on dysphagia 1 diet  Brief Narrative:   Jay Rivas is a 73 y.o. male with medical history significant of dementia, Parkinson disease, hypertension, hyperlipidemia, GERD and seasonal rhinitis; who presented to the hospital from nursing home secondary to fever, general malaise and productive cough.  Symptoms have been present for the last 2 to 3 days and worsening.  At time of arrival to the ED patient requiring 2 L supplementation to maintain saturation (do not use oxygen at baseline) patient was febrile, tachypneic and tachycardic.  Urinalysis with concerns for the presence of a UTI and a chest x-ray not demonstrating acute infiltrates.   IV fluids provided, cultures taken and IV antibiotics initiated.  Patient received antipyretics and oxygen supplementation.  TR[facility] has been contacted to place patient in the hospital for further evaluation and management.   Per family reports patient has been started on antiseizure medications about 1 month ago after having sudden seizure-like activities.   Respiratory panel by PCR demonstrated to be negative for COVID, influenza and RSV.    Assessment & Plan:   Principal Problem:   Sepsis (HCC)  Assessment and plan:  Sepsis: Met sepsis criteria on admission  -Still hypotensive, but BP stabilized with midodrine 102/51, satting 96% on 2 L of oxygen, otherwise hemodynamically stable - Overall improving SIRS physiology  - Appears to be in the setting of pneumonia (most likely aspiration with  underlying history of Parkinson) and presumed UTI  -Continue treatment with IV antibiotics (h/o penicillin allergy - Follow culture results    Toxic metabolic encephalopathy with underlying dementia  Mentation remains poor only oriented to self, following commands  -Obtaining MRI of the brain  Treating underlying cause of infection   difficulty swallowing/dysphagia - Patient with underlying swallowing difficulties in the setting of Parkinson disease and dementia -Per speech, can high risk for aspiration, continue Dysphagia 1 diet  - Continue supportive care and IV medications.   -Per nursing staff pocketing food in his mouth -Still high risk for aspiration   History of dementia/Parkinson's - Aricept  was held, resuming now - Continue reorientation and supportive care.    Presumed seizure disorder - Continue IV Depakote for now--will try to switch her to p.o.   Hypertension - Overall blood pressure is soft but stable; better. - Continue home BP meds, encouraged oral hydration    - Midodrine has been initiated, will continue with current dose   Seasonal allergies - Continue DuoNeb - Planning to resume the use of Allegra  when able to tolerate p.o.'s.    DNR/DNI - Patient remains at high risk for decompensation - DNR/DNI 8 CODE STATUS has been confirmed - Appreciate assistance and recommendation by palliative care service; MOST  form has been completed. - Planning to continue to treat was treatable and follow patient response. - In case of further declining/decom    ----------------------------------------------------------------------------------------------------- Nutritional status:  The patient's BMI is: Body mass index is 22.32 kg/m. I agree with the assessment and plan as outlined   Skin Assessment: I have examined the patient's skin and I agree with the wound assessment as performed by wound care team As outlined belowe: Wound 07/25/24 1553 Pressure Injury  Sacrum Bilateral Stage 1 -  Intact skin with non-blanchable redness of a localized area usually over a bony prominence. (Active)   DVT prophylaxis:  SCDs   Code Status:   Code Status: Limited: Do not attempt resuscitation (DNR) -DNR-LIMITED -Do Not Intubate/DNI   Family Communication: POA, son present at bedside, updated, continue detailed discussion regarding goals of care -Advance care planning has been discussed.   Admission status:   Status is: Inpatient Remains inpatient appropriate because: Required treatment for sepsis, IV fluid, IV antibiotics   Disposition: From  - home             Planning for discharge in 1 days to SNF    Procedures:   No admission procedures for hospital encounter.   Antimicrobials:  Anti-infectives (From admission, onward)    Start     Dose/Rate Route Frequency Ordered Stop   07/26/24 1100  levofloxacin  (LEVAQUIN ) IVPB 750 mg        750 mg 100 mL/hr over 90 Minutes Intravenous Every 24 hours 07/25/24 1737     07/25/24 1100  levofloxacin  (LEVAQUIN ) IVPB 750 mg        750 mg 100 mL/hr over 90 Minutes Intravenous  Once 07/25/24 1054 07/25/24 1256        Medication:   budesonide (PULMICORT) nebulizer solution  0.5 mg Nebulization BID   Chlorhexidine  Gluconate Cloth  6 each Topical Daily   dextromethorphan-guaiFENesin  1 tablet Oral BID   feeding supplement  237 mL Oral TID BM   folic acid   1 mg Intravenous Daily   midodrine  5 mg Oral TID WC   multivitamin with minerals  1 tablet Oral Daily    acetaminophen  **OR** acetaminophen , artificial tears, ipratropium-albuterol, ondansetron  **OR** ondansetron  (ZOFRAN ) IV, mouth rinse   Objective:   Vitals:   07/28/24 0700 07/28/24 0745 07/28/24 0800 07/28/24 0900  BP: (!) 102/56  102/64 (!) 102/51  Pulse: 71   69  Resp: 19  17 18   Temp:  98.5 F (36.9 C)    TempSrc:  Axillary    SpO2: 95%   96%  Weight:      Height:        Intake/Output Summary (Last 24 hours) at 07/28/2024 1108 Last  data filed at 07/28/2024 0904 Gross per 24 hour  Intake 2429.29 ml  Output 1450 ml  Net 979.29 ml   Filed Weights   07/25/24 1041 07/25/24 1052 07/25/24 1553  Weight: 60.8 kg 62 kg 66.6 kg     Physical examination:        General:  AAO x 1,  cooperative, no distress; somnolent this morning  HEENT:  Upper airway gurgling Normocephalic, PERRL, otherwise with in Normal limits   Neuro:  CNII-XII intact. , normal motor and sensation, reflexes intact   Lungs:   Clear to auscultation BL, Respirations unlabored,  No wheezes / crackles  Cardio:    S1/S2, RRR, No murmure, No Rubs or Gallops   Abdomen:  Soft, non-tender, bowel sounds active all four quadrants, no guarding  or peritoneal signs.  Muscular  skeletal:  Limited exam -severe global generalized weaknesses - in bed, able to move all 4 extremities,   2+ pulses,  symmetric, No pitting edema  Skin:  Dry, warm to touch, negative for any Rashes,  Wounds: Please see nursing documentation  Wound 07/25/24 1553 Pressure Injury Sacrum Bilateral Stage 1 -  Intact skin with non-blanchable redness of a localized area usually over a bony prominence. (Active)         ------------------------------------------------------------------------------------------------------------------------------------------    LABs:     Latest Ref Rng & Units 07/28/2024    4:57 AM 07/27/2024    5:10 AM 07/26/2024    3:45 AM  CBC  WBC 4.0 - 10.5 K/uL 3.4  4.1  5.7   Hemoglobin 13.0 - 17.0 g/dL 8.7  9.5  89.5   Hematocrit 39.0 - 52.0 % 25.0  27.5  30.9   Platelets 150 - 400 K/uL 140  126  120       Latest Ref Rng & Units 07/28/2024    4:57 AM 07/27/2024    5:10 AM 07/26/2024    3:45 AM  CMP  Glucose 70 - 99 mg/dL 890  93  894   BUN 8 - 23 mg/dL 10  13  14    Creatinine 0.61 - 1.24 mg/dL 9.14  9.08  9.14   Sodium 135 - 145 mmol/L 137  138  136   Potassium 3.5 - 5.1 mmol/L 3.4  3.6  4.0   Chloride 98 - 111 mmol/L 100  100  97   CO2 22 - 32 mmol/L 26   26  27    Calcium  8.9 - 10.3 mg/dL 8.9  9.1  8.5        Micro Results Recent Results (from the past 240 hours)  Blood Culture (routine x 2)     Status: None (Preliminary result)   Collection Time: 07/25/24 11:18 AM   Specimen: BLOOD  Result Value Ref Range Status   Specimen Description BLOOD RIGHT ARM  Final   Special Requests   Final    BOTTLES DRAWN AEROBIC AND ANAEROBIC Blood Culture adequate volume   Culture   Final    NO GROWTH 3 DAYS Performed at [facility], [address] [place], [place] [address]    Report Status PENDING  Incomplete  Blood Culture (routine x 2)     Status: None (Preliminary result)   Collection Time: 07/25/24 11:23 AM   Specimen: BLOOD  Result Value Ref Range Status   Specimen Description BLOOD BLOOD LEFT ARM  Final   Special Requests   Final    BOTTLES DRAWN AEROBIC ONLY Blood Culture results may not be optimal due to an inadequate volume of blood received in culture bottles   Culture   Final    NO GROWTH 3 DAYS Performed at [facility], [address], [place], [place] [address]    Report Status PENDING  Incomplete  Resp panel by RT-PCR (RSV, Flu A&B, Covid)     Status: None   Collection Time: 07/25/24 11:31 AM  Result Value Ref Range Status   SARS Coronavirus 2 by RT PCR NEGATIVE NEGATIVE Final    Comment: (NOTE) SARS-CoV-2 target nucleic acids are NOT DETECTED.  The SARS-CoV-2 RNA is generally detectable in upper respiratory specimens during the acute phase of infection. The lowest concentration of SARS-CoV-2 viral copies this assay can detect is 138 copies/mL. A negative result does not preclude SARS-Cov-2 infection and should not be used as  the sole basis for treatment or other patient management decisions. A negative result may occur with  improper specimen collection/handling, submission of specimen other than nasopharyngeal swab, presence of viral mutation(s) within the areas targeted by this assay, and inadequate number of  viral copies(<138 copies/mL). A negative result must be combined with clinical observations, patient history, and epidemiological information. The expected result is Negative.  Fact Sheet for Patients:  BloggerCourse.com  Fact Sheet for Healthcare Providers:  SeriousBroker.it  This test is no t yet approved or cleared by the [place]  FDA and  has been authorized for detection and/or diagnosis of SARS-CoV-2 by FDA under an Emergency Use Authorization (EUA). This EUA will remain  in effect (meaning this test can be used) for the duration of the COVID-19 declaration under Section 564(b)(1) of the Act, 21 U.S.C.section 360bbb-3(b)(1), unless the authorization is terminated  or revoked sooner.       Influenza A by PCR NEGATIVE NEGATIVE Final   Influenza B by PCR NEGATIVE NEGATIVE Final    Comment: (NOTE) The Xpert Xpress SARS-CoV-2/FLU/RSV plus assay is intended as an aid in the diagnosis of influenza from Nasopharyngeal swab specimens and should not be used as a sole basis for treatment. Nasal washings and aspirates are unacceptable for Xpert Xpress SARS-CoV-2/FLU/RSV testing.  Fact Sheet for Patients: BloggerCourse.com  Fact Sheet for Healthcare Providers: SeriousBroker.it  This test is not yet approved or cleared by the [place]  FDA and has been authorized for detection and/or diagnosis of SARS-CoV-2 by FDA under an Emergency Use Authorization (EUA). This EUA will remain in effect (meaning this test can be used) for the duration of the COVID-19 declaration under Section 564(b)(1) of the Act, 21 U.S.C. section 360bbb-3(b)(1), unless the authorization is terminated or revoked.     Resp Syncytial Virus by PCR NEGATIVE NEGATIVE Final    Comment: (NOTE) Fact Sheet for Patients: BloggerCourse.com  Fact Sheet for Healthcare  Providers: SeriousBroker.it  This test is not yet approved or cleared by the [place]  FDA and has been authorized for detection and/or diagnosis of SARS-CoV-2 by FDA under an Emergency Use Authorization (EUA). This EUA will remain in effect (meaning this test can be used) for the duration of the COVID-19 declaration under Section 564(b)(1) of the Act, 21 U.S.C. section 360bbb-3(b)(1), unless the authorization is terminated or revoked.  Performed at [facility], [address], [place], [place] [address]   Urine Culture     Status: None   Collection Time: 07/25/24 11:41 AM   Specimen: Urine, Catheterized  Result Value Ref Range Status   Specimen Description   Final    URINE, CATHETERIZED Performed at [facility], [address], [place], [place] [address]    Special Requests   Final    NONE Reflexed from [id number] Performed at [facility], [address] [place], [place] [address]    Culture   Final    NO GROWTH Performed at [facility], [address], [place], [place] [address]    Report Status 07/26/2024 FINAL  Final  MRSA Next Gen by PCR, Nasal     Status: None   Collection Time: 07/25/24  4:00 PM   Specimen: Nasal Mucosa; Nasal Swab  Result Value Ref Range Status   MRSA by PCR Next Gen NOT DETECTED NOT DETECTED Final    Comment: (NOTE) The GeneXpert MRSA Assay (FDA approved for NASAL specimens only), is one component of a comprehensive MRSA colonization surveillance program. It is not intended  to diagnose MRSA infection nor to guide or monitor treatment for MRSA infections. Test performance is not FDA approved in patients less than 53 years old. Performed at [facility], [address] [place], [place] [address]     Radiology Reports No results found.  SIGNED: Adriana DELENA Grams, MD, FHM. FAAFP. Jolynn Pack - [facility] Critical care time spent - 55 min.  In seeing, evaluating and examining the patient. Reviewing  medical records, labs, drawn plan of care. [facility],  Pager (please use [email or web address] to page/ text) Please use Epic Secure Chat for non-urgent communication (7AM-7PM)  If 7PM-7AM, please contact night-coverage [email or web address], 07/28/2024, 11:08 AM

## 2024-07-29 DIAGNOSIS — J189 Pneumonia, unspecified organism: Secondary | ICD-10-CM | POA: Diagnosis not present

## 2024-07-29 DIAGNOSIS — J181 Lobar pneumonia, unspecified organism: Secondary | ICD-10-CM | POA: Diagnosis not present

## 2024-07-29 DIAGNOSIS — A419 Sepsis, unspecified organism: Secondary | ICD-10-CM | POA: Diagnosis not present

## 2024-07-29 DIAGNOSIS — R4182 Altered mental status, unspecified: Secondary | ICD-10-CM | POA: Diagnosis not present

## 2024-07-29 LAB — T3, FREE: T3, Free: 2.5 pg/mL (ref 2.0–4.4)

## 2024-07-29 LAB — CBC
HCT: 29.9 % — ABNORMAL LOW (ref 39.0–52.0)
Hemoglobin: 9.9 g/dL — ABNORMAL LOW (ref 13.0–17.0)
MCH: 30.7 pg (ref 26.0–34.0)
MCHC: 33.1 g/dL (ref 30.0–36.0)
MCV: 92.6 fL (ref 80.0–100.0)
Platelets: 158 K/uL (ref 150–400)
RBC: 3.23 MIL/uL — ABNORMAL LOW (ref 4.22–5.81)
RDW: 11.9 % (ref 11.5–15.5)
WBC: 4.4 K/uL (ref 4.0–10.5)
nRBC: 0 % (ref 0.0–0.2)

## 2024-07-29 LAB — BASIC METABOLIC PANEL WITH GFR
Anion gap: 12 (ref 5–15)
BUN: 9 mg/dL (ref 8–23)
CO2: 26 mmol/L (ref 22–32)
Calcium: 9.3 mg/dL (ref 8.9–10.3)
Chloride: 101 mmol/L (ref 98–111)
Creatinine, Ser: 0.83 mg/dL (ref 0.61–1.24)
GFR, Estimated: >60 mL/min (ref >60)
Glucose, Bld: 96 mg/dL (ref 70–99)
Potassium: 3.7 mmol/L (ref 3.5–5.1)
Sodium: 140 mmol/L (ref 135–145)

## 2024-07-29 MED ORDER — LEVOFLOXACIN 750 MG PO TABS: 750.0000 mg | ORAL_TABLET | Freq: Every day | ORAL | 0 refills | Status: AC

## 2024-07-29 MED ORDER — DM-GUAIFENESIN ER 30-600 MG PO TB12: 1.0000 | ORAL_TABLET | Freq: Two times a day (BID) | ORAL | 0 refills | Status: AC

## 2024-07-29 MED ORDER — IPRATROPIUM-ALBUTEROL 0.5-2.5 (3) MG/3ML IN SOLN: 3.0000 mL | Freq: Four times a day (QID) | RESPIRATORY_TRACT | 0 refills | Status: AC | PRN

## 2024-07-29 MED ORDER — MIDODRINE HCL 5 MG PO TABS: 5.0000 mg | ORAL_TABLET | Freq: Three times a day (TID) | ORAL | 0 refills | Status: DC

## 2024-07-29 MED ORDER — LEVOFLOXACIN 750 MG PO TABS: 750.0000 mg | ORAL_TABLET | Freq: Every day | ORAL | 0 refills | Status: DC

## 2024-07-29 MED ORDER — ENSURE PLUS HIGH PROTEIN PO LIQD: 237.0000 mL | Freq: Three times a day (TID) | ORAL | 0 refills | Status: AC

## 2024-07-29 MED ORDER — DIVALPROEX SODIUM 125 MG PO DR TAB: 125.0000 mg | DELAYED_RELEASE_TABLET | Freq: Two times a day (BID) | ORAL | 1 refills | Status: DC

## 2024-07-29 NOTE — [place] FL2 (Signed)
 [place]  MEDICAID FL2 LEVEL OF CARE FORM     IDENTIFICATION  Patient Name: Jay Rivas Birthdate: 03/19/1951 Sex: male Admission Date (Current Location): 07/25/2024  [facility] and [place] Number:  [employer] and Address:  [facility],  [address] [place] [address]      Provider Number: [id number]  Attending Physician Name and Address:  Willette Adriana LABOR, MD  Relative Name and Phone Number:       Current Level of Care: Hospital Recommended Level of Care: [facility] Prior Approval Number:    Date Approved/Denied:   PASRR Number:    Discharge Plan: Other (Comment) [facility])    Current Diagnoses: Patient Active Problem List   Diagnosis Date Noted   Pressure injury of skin 07/27/2024   Sepsis (HCC) 07/25/2024   Seizure-like activity (HCC) 05/04/2024   Acute metabolic encephalopathy 05/04/2024   Parkinsonism (HCC) 05/04/2024   Major neurocognitive disorder (HCC) 05/04/2024   Iron deficiency anemia, unspecified 11/14/2022   Carpal tunnel syndrome 11/14/2022   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 Stage I to sacrum with foam dressing.)                       Personal Care Assistance Level of Assistance  Bathing, Feeding, Dressing Bathing Assistance: Limited assistance Feeding assistance: Limited assistance Dressing Assistance: Limited assistance     Functional Limitations Info  Sight, Hearing,  Speech Sight Info: Adequate Hearing Info: Adequate Speech Info: Adequate    SPECIAL CARE FACTORS FREQUENCY  Speech therapy, PT (By licensed PT)     PT Frequency: 2 times weekly       Speech Therapy Frequency: 2 times weekly      Contractures Contractures Info: Not present    Additional Factors Info  Code Status, Allergies, Psychotropic Code Status Info: DNR- Limited Allergies Info: Penicillin G Psychotropic Info: Depakote         Current Medications (07/29/2024):  This is the current hospital active medication list Current Facility-Administered Medications  Medication Dose Route Frequency Provider Last Rate Last Admin   acetaminophen  (TYLENOL ) tablet 650 mg  650 mg Oral Q6H PRN Ricky Fines, MD       Or   acetaminophen  (TYLENOL ) suppository 650 mg  650 mg Rectal Q6H PRN Ricky Fines, MD   650 mg at 07/26/24 1137   artificial tears ophthalmic solution 1 drop  1 drop Both Eyes PRN Cleotilde Laymon HERO, NP       budesonide (PULMICORT) nebulizer solution 0.5 mg  0.5 mg Nebulization BID Ricky Fines, MD   0.5 mg at 07/29/24 9050   Chlorhexidine  Gluconate Cloth 2 % PADS 6 each  6 each Topical Daily Ricky Fines, MD   6 each at 07/28/24 1113   dextromethorphan-guaiFENesin (MUCINEX DM) 30-600 MG per 12 hr tablet 1 tablet  1 tablet Oral BID Ricky Fines, MD   1 tablet at 07/29/24 0921   divalproex (DEPAKOTE)  DR tablet 125 mg  125 mg Oral Q12H Shahmehdi, Seyed A, MD   125 mg at 07/29/24 1105   feeding supplement (ENSURE PLUS HIGH PROTEIN) liquid 237 mL  237 mL Oral TID BM Shahmehdi, Seyed A, MD   237 mL at 07/29/24 0942   ipratropium-albuterol (DUONEB) 0.5-2.5 (3) MG/3ML nebulizer solution 3 mL  3 mL Nebulization Q6H PRN Ricky Fines, MD       levofloxacin  (LEVAQUIN ) IVPB 750 mg  750 mg Intravenous Q24H Ricky Fines, MD 100 mL/hr at 07/28/24 1012 750 mg at 07/28/24 1012   midodrine (PROAMATINE) tablet 5 mg  5 mg Oral TID WC Shahmehdi, Seyed A, MD   5 mg at 07/29/24 9078    multivitamin with minerals tablet 1 tablet  1 tablet Oral Daily Shahmehdi, Seyed A, MD   1 tablet at 07/29/24 9078   ondansetron  (ZOFRAN ) tablet 4 mg  4 mg Oral Q6H PRN Ricky Fines, MD       Or   ondansetron  (ZOFRAN ) injection 4 mg  4 mg Intravenous Q6H PRN Ricky Fines, MD       Oral care mouth rinse  15 mL Mouth Rinse PRN Ricky Fines, MD         Discharge Medications: Allergies as of 07/29/2024       Reactions   Penicillin G Anaphylaxis        Medication List     STOP taking these medications    losartan  25 MG tablet Commonly known as: COZAAR        TAKE these medications    aspirin  EC 81 MG tablet Take 1 tablet (81 mg total) by mouth daily.   cyanocobalamin  500 MCG tablet Commonly known as: VITAMIN B12 Take 1 tablet (500 mcg total) by mouth daily.   dextromethorphan-guaiFENesin 30-600 MG 12hr tablet Commonly known as: MUCINEX DM Take 1 tablet by mouth 2 (two) times daily for 5 days.   divalproex 125 MG DR tablet Commonly known as: DEPAKOTE Take 1 tablet (125 mg total) by mouth every 12 (twelve) hours. What changed:  medication strength how much to take when to take this   donepezil  10 MG tablet Commonly known as: ARICEPT  TAKE 1 TABLET BY MOUTH EVERYDAY AT BEDTIME   feeding supplement Liqd Take 237 mLs by mouth 3 (three) times daily between meals.   fexofenadine  180 MG tablet Commonly known as: ALLEGRA  Take 1 tablet (180 mg total) by mouth daily as needed for allergies or rhinitis.   folic acid  1 MG tablet Commonly known as: FOLVITE  Take 1 tablet (1 mg total) by mouth daily.   ipratropium-albuterol 0.5-2.5 (3) MG/3ML Soln Commonly known as: DUONEB Take 3 mLs by nebulization every 6 (six) hours as needed.   levofloxacin  750 MG tablet Commonly known as: Levaquin  Take 1 tablet (750 mg total) by mouth daily for 5 days.   Metamucil 0.36 g Caps Generic drug: Psyllium Take 0.36 g by mouth daily.   midodrine 5 MG tablet Commonly known as:  PROAMATINE Take 1 tablet (5 mg total) by mouth 3 (three) times daily with meals.   QC Vitamin D3 25 MCG (1000 UT) tablet Generic drug: Cholecalciferol Take 1 tablet (1,000 Units total) by mouth daily.   Vitamin K2 -Vitamin D3 90-125 MCG Caps Take 1 capsule by mouth daily.               Discharge Care Instructions  (From admission, onward)           Start     Ordered  07/29/24 0000  Discharge wound care:       Comments: Per RN wound instructions   07/29/24 0736             Relevant Imaging Results:  Relevant Lab Results:   Additional Information SSN: [id number] LCSWA

## 2024-07-29 NOTE — TOC Transition Note (Signed)
 Transition of Care [facility]) - Discharge Note   Patient Details  Name: Jay Rivas MRN: [id number] Date of Birth: 1951/10/19  [facility] CM/SW Contact:  Lucie Lunger, LCSWA Phone Number: 07/29/2024, 12:20 PM   Clinical Narrative:    CSW updated that pt is medically stable for D/C back to [facility] home today. CSW spoke to [place] with [facility] who confirms they can accept pt back. D/C clinicals sent to facility for review. HH PT/ST arranged with [facility], [facility] updated on plan for D/C today, MD placed HH orders. Pts son updated on plan for D/C today. TOC signing off.   Final next level of care: Home w Home Health Services Barriers to Discharge: Barriers Resolved   Patient Goals and CMS Choice Patient states their goals for this hospitalization and ongoing recovery are:: return to [facility] CMS Medicare.gov Compare Post Acute Care list provided to:: Patient Represenative (must comment) Choice offered to / list presented to : Adult Children [place] ownership interest in [facility].provided to::  (n/a)    Discharge Placement                  Name of family member notified: Son Patient and family notified of of transfer: 07/29/24  Discharge Plan and Services Additional resources added to the After Visit Summary for   In-house Referral: Clinical Social Work   Post Acute Care Choice: Resumption of Svcs/PTA Provider                    HH Arranged: PT, Speech Therapy HH Agency: [facility] Date [facility] Agency Contacted: 07/29/24 Time HH Agency Contacted: 1004 Representative spoke with at [facility] Agency: [facility]  Social Drivers of Health (SDOH) Interventions SDOH Screenings   Food Insecurity: Patient Unable To Answer (07/25/2024)  Housing: Patient Unable To Answer (07/25/2024)  Transportation Needs: Patient Unable To Answer (07/25/2024)  Utilities: Patient Unable To Answer (07/25/2024)  Alcohol Screen: Low Risk  (12/21/2023)   Depression (PHQ2-9): Low Risk  (12/21/2023)  Financial Resource Strain: Low Risk  (12/21/2023)  Physical Activity: Sufficiently Active (12/21/2023)  Social Connections: Unknown (07/25/2024)  Stress: No Stress Concern Present (12/21/2023)  Tobacco Use: Medium Risk (07/25/2024)  Health Literacy: Adequate Health Literacy (12/21/2023)     Readmission Risk Interventions     No data to display

## 2024-07-29 NOTE — Discharge Summary (Signed)
 Physician Discharge Summary   Patient: Jay Rivas MRN: [id number] DOB: [birth year]  Admit date:     07/25/2024  Discharge date: 07/29/24  Discharge Physician: Adriana DELENA Grams   PCP: Jay Butler DASEN, MD   Recommendations at discharge:  Follow-up with PCP in 1 week,  follow-up with the palliative care as soon as possible Continue current medication including course of antibiotic Aspiration precautions-follow dysphagia 1 diet   Discharge Diagnoses: Principal Problem:   Sepsis (HCC) Active Problems:   Hypertension   HLD (hyperlipidemia)   Iron deficiency anemia, unspecified   Myoclonus   Acute metabolic encephalopathy   Parkinsonism (HCC)   Pressure injury of skin  Resolved Problems:   * No resolved hospital problems. *  Hospital Course: Jay Rivas is a 73 y.o. male with medical history significant of dementia, Parkinson disease, hypertension, hyperlipidemia, GERD and seasonal rhinitis; who presented to the hospital from nursing home secondary to fever, general malaise and productive cough.  Symptoms have been present for the last 2 to 3 days and worsening.  At time of arrival to the ED patient requiring 2 L supplementation to maintain saturation (do not use oxygen at baseline) patient was febrile, tachypneic and tachycardic.  Urinalysis with concerns for the presence of a UTI and a chest x-ray not demonstrating acute infiltrates.   IV fluids provided, cultures taken and IV antibiotics initiated.  Patient received antipyretics and oxygen supplementation.  TR[facility] has been contacted to place patient in the hospital for further evaluation and management.   Per family reports patient has been started on antiseizure medications about 1 month ago after having sudden seizure-like activities.   Respiratory panel by PCR demonstrated to be negative for COVID, influenza and RSV.    Sepsis: Met sepsis criteria on admission - Aside from hypotension on midodrine, otherwise  hemodynamically stable -Overall improved sepsis physiology  - Appears to be in the setting of pneumonia (most likely aspiration with underlying history of Parkinson) and presumed UTI  -Continue treatment with IV antibiotics (h/o penicillin allergy) Has been on IV Levaquin , switch to p.o. - Follow culture results-no growth to date    Toxic metabolic encephalopathy with underlying dementia  Mentation remains poor only oriented to self, following commands  - MRI of the brain:  No acute intracranial abnormality.  Multifocal hyperintense T2-weighted signal within the cerebral white matter, most commonly due to chronic small vessel disease.  Generalized volume loss, slightly worse on the left than the right.   Treating underlying causes, including infection   Difficulty swallowing/dysphagia - Patient with underlying swallowing difficulties in the setting of Parkinson disease and dementia -Per speech, can high risk for aspiration, continue Dysphagia 1 diet - High risk for aspiration, aspiration precaution     History of dementia/Parkinson's - Aricept   -to be continued - Continue reorientation and supportive care.    Presumed seizure disorder - Continue IV Depakote for now--switch back to p.o.   Hypertension - Overall blood pressure is soft but stable; better. - Continue home BP meds, encouraged oral hydration    - Midodrine has been initiated, will continue with current dose   Seasonal allergies - Continue DuoNeb - Planning to resume the use of Allegra  when able to tolerate p.o.'s.    DNR/DNI - Patient remains at high risk for decompensation - DNR/DNI  CODE STATUS has been confirmed - Appreciate assistance and recommendation by palliative care service; MOST form has been completed. - Planning to continue to treat was treatable and follow patient  response. - In case of further declining/decom         Consultants: TOC/palliative care team Procedures performed: CT  and MRI of the brain Disposition: SNF Diet recommendation:  Discharge Diet Orders (From admission, onward)     Start     Ordered   07/29/24 0000  Regular diet Dysphagia 1 Aspiration precaution 07/29/24 0736            DISCHARGE MEDICATION: Allergies as of 07/29/2024       Reactions   Penicillin G Anaphylaxis        Medication List     STOP taking these medications    losartan  25 MG tablet Commonly known as: COZAAR        TAKE these medications    aspirin  EC 81 MG tablet Take 1 tablet (81 mg total) by mouth daily.   cyanocobalamin  500 MCG tablet Commonly known as: VITAMIN B12 Take 1 tablet (500 mcg total) by mouth daily.   dextromethorphan-guaiFENesin 30-600 MG 12hr tablet Commonly known as: MUCINEX DM Take 1 tablet by mouth 2 (two) times daily for 5 days.   divalproex 125 MG DR tablet Commonly known as: DEPAKOTE Take 1 tablet (125 mg total) by mouth every 12 (twelve) hours. What changed:  medication strength how much to take when to take this   donepezil  10 MG tablet Commonly known as: ARICEPT  TAKE 1 TABLET BY MOUTH EVERYDAY AT BEDTIME   feeding supplement Liqd Take 237 mLs by mouth 3 (three) times daily between meals.   fexofenadine  180 MG tablet Commonly known as: ALLEGRA  Take 1 tablet (180 mg total) by mouth daily as needed for allergies or rhinitis.   folic acid  1 MG tablet Commonly known as: FOLVITE  Take 1 tablet (1 mg total) by mouth daily.   ipratropium-albuterol 0.5-2.5 (3) MG/3ML Soln Commonly known as: DUONEB Take 3 mLs by nebulization every 6 (six) hours as needed.   levofloxacin  750 MG tablet Commonly known as: Levaquin  Take 1 tablet (750 mg total) by mouth daily for 5 days.   Metamucil 0.36 g Caps Generic drug: Psyllium Take 0.36 g by mouth daily.   midodrine 5 MG tablet Commonly known as: PROAMATINE Take 1 tablet (5 mg total) by mouth 3 (three) times daily with meals.   QC Vitamin D3 25 MCG (1000 UT) tablet Generic  drug: Cholecalciferol Take 1 tablet (1,000 Units total) by mouth daily.   Vitamin K2 -Vitamin D3 90-125 MCG Caps Take 1 capsule by mouth daily.               Discharge Care Instructions  (From admission, onward)           Start     Ordered   07/29/24 0000  Discharge wound care:       Comments: Per RN wound instructions   07/29/24 0736            Follow-up Information     [facility] Follow [facility].   Specialty: Home Health Services Why: Will contact you to schedule home health visits. Contact information: [address] [place] [address] [phone]         [facility] Follow up.   Contact information: [address] [facility] [place]  [address] [phone]               Discharge Exam: Filed Weights   07/25/24 1041 07/25/24 1052 07/25/24 1553  Weight: 60.8 kg 62 kg 66.6 kg        General:  AAO x 1,  cooperative, no distress;   HEENT:  Normocephalic, PERRL, otherwise with in Normal limits   Neuro:  CNII-XII intact. , normal motor and sensation, reflexes intact   Lungs:   Clear to auscultation BL, Respirations unlabored,  No wheezes / crackles  Cardio:    S1/S2, RRR, No murmure, No Rubs or Gallops   Abdomen:  Soft, non-tender, bowel sounds active all four quadrants, no guarding or peritoneal signs.  Muscular  skeletal:  Limited exam -global generalized weaknesses - in bed, able to move all 4 extremities,   2+ pulses,  symmetric, No pitting edema  Skin:  Dry, warm to touch, negative for any Rashes,  Wounds: Please see nursing documentation  Wound 07/25/24 1553 Pressure Injury Sacrum Bilateral Stage 1 -  Intact skin with non-blanchable redness of a localized area usually over a bony prominence. (Active)          Condition at discharge: stable  The results of significant diagnostics from this hospitalization (including imaging, microbiology, ancillary and laboratory) are listed below for  reference.   Imaging Studies: MR BRAIN WO CONTRAST Result Date: 07/28/2024 EXAM: MRI BRAIN WITHOUT CONTRAST 07/28/2024 12:19:40 PM TECHNIQUE: Multiplanar multisequence MRI of the head/brain was performed without the administration of intravenous contrast. COMPARISON: 05/04/2024 CLINICAL HISTORY: Altered mental status, nontraumatic (Ped 0-17y). Altered mental status; Pt shakes uncontrollably. FINDINGS: BRAIN AND VENTRICLES: No acute infarct. No intracranial hemorrhage. No mass. No midline shift. No hydrocephalus. Generalized volume loss, slightly worse on the left than the right. Multifocal hyperintense T2-weighted signal within the cerebral white matter, most commonly due to chronic small vessel disease. The sella is unremarkable. Normal flow voids. ORBITS: No acute abnormality. SINUSES AND MASTOIDS: Retained secretions in the right sphenoid sinus. BONES AND SOFT TISSUES: Normal marrow signal. No acute soft tissue abnormality. IMPRESSION: 1. No acute intracranial abnormality. 2. Multifocal hyperintense T2-weighted signal within the cerebral white matter, most commonly due to chronic small vessel disease. 3. Generalized volume loss, slightly worse on the left than the right. Electronically signed by: Franky Stanford MD 07/28/2024 08:22 PM EDT RP Workstation: [id number]   DG Chest Port 1 View Result Date: 07/25/2024 CLINICAL DATA:  Possible sepsis. Congestion with fever and cough 2 days. EXAM: PORTABLE CHEST 1 VIEW COMPARISON:  05/04/2024 FINDINGS: Patient slightly rotated to the left. Lungs are adequately inflated without focal airspace consolidation or effusion. Cardiomediastinal silhouette and remainder of the exam is unchanged. IMPRESSION: No active disease. Electronically Signed   By: Toribio Agreste M.D.   On: 07/25/2024 11:22    Microbiology: Results for orders placed or performed during the hospital encounter of 07/25/24  Blood Culture (routine x 2)     Status: None (Preliminary result)   Collection  Time: 07/25/24 11:18 AM   Specimen: BLOOD  Result Value Ref Range Status   Specimen Description BLOOD RIGHT ARM  Final   Special Requests   Final    BOTTLES DRAWN AEROBIC AND ANAEROBIC Blood Culture adequate volume   Culture   Final    NO GROWTH 3 DAYS Performed at [facility], [address] [place], [place] [address]    Report Status PENDING  Incomplete  Blood Culture (routine x 2)     Status: None (Preliminary result)   Collection Time: 07/25/24 11:23 AM   Specimen: BLOOD  Result Value Ref Range Status   Specimen Description BLOOD BLOOD LEFT ARM  Final   Special Requests   Final    BOTTLES DRAWN AEROBIC ONLY Blood Culture results  may not be optimal due to an inadequate volume of blood received in culture bottles   Culture   Final    NO GROWTH 3 DAYS Performed at [facility], [address] [place], [place] [address]    Report Status PENDING  Incomplete  Resp panel by RT-PCR (RSV, Flu A&B, Covid)     Status: None   Collection Time: 07/25/24 11:31 AM  Result Value Ref Range Status   SARS Coronavirus 2 by RT PCR NEGATIVE NEGATIVE Final    Comment: (NOTE) SARS-CoV-2 target nucleic acids are NOT DETECTED.  The SARS-CoV-2 RNA is generally detectable in upper respiratory specimens during the acute phase of infection. The lowest concentration of SARS-CoV-2 viral copies this assay can detect is 138 copies/mL. A negative result does not preclude SARS-Cov-2 infection and should not be used as the sole basis for treatment or other patient management decisions. A negative result may occur with  improper specimen collection/handling, submission of specimen other than nasopharyngeal swab, presence of viral mutation(s) within the areas targeted by this assay, and inadequate number of viral copies(<138 copies/mL). A negative result must be combined with clinical observations, patient history, and epidemiological information. The expected result is Negative.  Fact Sheet for Patients:   BloggerCourse.com  Fact Sheet for Healthcare Providers:  SeriousBroker.it  This test is no t yet approved or cleared by the [place]  FDA and  has been authorized for detection and/or diagnosis of SARS-CoV-2 by FDA under an Emergency Use Authorization (EUA). This EUA will remain  in effect (meaning this test can be used) for the duration of the COVID-19 declaration under Section 564(b)(1) of the Act, 21 U.S.C.section 360bbb-3(b)(1), unless the authorization is terminated  or revoked sooner.       Influenza A by PCR NEGATIVE NEGATIVE Final   Influenza B by PCR NEGATIVE NEGATIVE Final    Comment: (NOTE) The Xpert Xpress SARS-CoV-2/FLU/RSV plus assay is intended as an aid in the diagnosis of influenza from Nasopharyngeal swab specimens and should not be used as a sole basis for treatment. Nasal washings and aspirates are unacceptable for Xpert Xpress SARS-CoV-2/FLU/RSV testing.  Fact Sheet for Patients: BloggerCourse.com  Fact Sheet for Healthcare Providers: SeriousBroker.it  This test is not yet approved or cleared by the [place]  FDA and has been authorized for detection and/or diagnosis of SARS-CoV-2 by FDA under an Emergency Use Authorization (EUA). This EUA will remain in effect (meaning this test can be used) for the duration of the COVID-19 declaration under Section 564(b)(1) of the Act, 21 U.S.C. section 360bbb-3(b)(1), unless the authorization is terminated or revoked.     Resp Syncytial Virus by PCR NEGATIVE NEGATIVE Final    Comment: (NOTE) Fact Sheet for Patients: BloggerCourse.com  Fact Sheet for Healthcare Providers: SeriousBroker.it  This test is not yet approved or cleared by the [place]  FDA and has been authorized for detection and/or diagnosis of SARS-CoV-2 by FDA under an Emergency Use  Authorization (EUA). This EUA will remain in effect (meaning this test can be used) for the duration of the COVID-19 declaration under Section 564(b)(1) of the Act, 21 U.S.C. section 360bbb-3(b)(1), unless the authorization is terminated or revoked.  Performed at [facility], [address], [place], [place] [address]   Urine Culture     Status: None   Collection Time: 07/25/24 11:41 AM   Specimen: Urine, Catheterized  Result Value Ref Range Status   Specimen Description   Final    URINE, CATHETERIZED Performed at [facility]  [facility], [address], [place], [place] [address]    Special Requests   Final    NONE Reflexed from [id number] Performed at [facility], [address] [place], [place] [address]    Culture   Final    NO GROWTH Performed at [facility], [address] [place], [place] [address]    Report Status 07/26/2024 FINAL  Final  MRSA Next Gen by PCR, Nasal     Status: None   Collection Time: 07/25/24  4:00 PM   Specimen: Nasal Mucosa; Nasal Swab  Result Value Ref Range Status   MRSA by PCR Next Gen NOT DETECTED NOT DETECTED Final    Comment: (NOTE) The GeneXpert MRSA Assay (FDA approved for NASAL specimens only), is one component of a comprehensive MRSA colonization surveillance program. It is not intended to diagnose MRSA infection nor to guide or monitor treatment for MRSA infections. Test performance is not FDA approved in patients less than 48 years old. Performed at [facility], [address], [place], [place] [address]     Labs: CBC: Recent Labs  Lab 07/25/24 1118 07/26/24 0345 07/27/24 0510 07/28/24 0457 07/29/24 0456  WBC 6.8 5.7 4.1 3.4* 4.4  NEUTROABS 5.2  --   --   --   --   HGB 11.0* 10.4* 9.5* 8.7* 9.9*  HCT 32.1* 30.9* 27.5* 25.0* 29.9*  MCV 91.5 94.8 91.4 90.9 92.6  PLT 143* 120* 126* 140* 158   Basic Metabolic Panel: Recent Labs  Lab 07/25/24 1118 07/25/24 1430 07/26/24 0345 07/27/24 0510 07/28/24 0457 07/29/24 0456   NA 135  --  136 138 137 140  K 3.8  --  4.0 3.6 3.4* 3.7  CL 96*  --  97* 100 100 101  CO2 24  --  27 26 26 26   GLUCOSE 107*  --  105* 93 109* 96  BUN 17  --  14 13 10 9   CREATININE 1.15  --  0.85 0.91 0.85 0.83  CALCIUM  8.6*  --  8.5* 9.1 8.9 9.3  MG  --  2.0  --   --   --   --   PHOS  --  3.8  --   --   --   --    Liver Function Tests: Recent Labs  Lab 07/25/24 1118  AST 21  ALT 34  ALKPHOS 55  BILITOT 1.2  PROT 6.5  ALBUMIN 3.4*   CBG: No results for input(s): GLUCAP in the last 168 hours.  Discharge time spent: greater than 40  minutes.  Signed: Adriana DELENA Grams, MD [facility] 07/29/2024

## 2024-07-29 NOTE — Progress Notes (Signed)
 Speech Language Pathology Treatment: Dysphagia  Patient Details Name: Jay Rivas MRN: [id number] DOB: [birth year] Today's Date: 07/29/2024 Time: 1115-1130 SLP Time Calculation (min) (ACUTE ONLY): 15 min  Assessment / Plan / Recommendation Clinical Impression  Pt seen for dysphagia tx session f/u to progress current diet and assess current function of swallowing.  Pt agreeable to tx and eager to consume po trials.  Pt consumed thin via straw with successive swallows and no overt s/s of aspiration present.  Upgraded consistency of soft solids (peaches) attempted with prolonged mastication efforts and prolonged oral preparation/propulsion, but pt was able to swallow within a timely manner after preparation complete.  Pt's cognitive status impairs his swallowing ability and places him at a higher risk for aspiration with solids at this time.  Recommend continue current diet of Dysphagia 1(Puree)/thin liquids (per pt request) with potential for upgrade to Dysphagia 2(minced)/thin at next venue of care.  ST should follow up at next venue for dysphagia tx.  Thank you for allowing us  to care for this patient.  HPI HPI: Jay Rivas is a 73 y.o. male with medical history significant of dementia, Parkinson disease, hypertension, hyperlipidemia, GERD and seasonal rhinitis; who presented to the hospital from nursing home secondary to fever, general malaise and productive cough. Symptoms have been present for the last 2 to 3 days and worsening. At time of arrival to the ED patient requiring 2 L supplementation to maintain saturation (do not use oxygen at baseline) patient was febrile, tachypneic and tachycardic. Urinalysis with concerns for the presence of a UTI and a chest x-ray not demonstrating acute infiltrates. ST consulted for swallow evaluation and f/u for diet progression/dysphagia tx.      SLP Plan  Discharge SLP treatment due to (comment)          Recommendations  Diet recommendations:  Dysphagia 1 (puree);Thin liquid Liquids provided via: Cup;Straw Medication Administration: Whole meds with puree Supervision: Staff to assist with self feeding;Full supervision/cueing for compensatory strategies Compensations: Slow rate;Small sips/bites Postural Changes and/or Swallow Maneuvers: Seated upright 90 degrees;Upright 30-60 min after meal                  Oral care BID;Staff/trained caregiver to provide oral care   Frequent or constant Supervision/Assistance Dysphagia, oropharyngeal phase (R13.12)     Discharge SLP treatment due to (comment)     Bruna Clause  07/29/2024, 12:47 PM

## 2024-07-29 NOTE — Care Management Important Message (Signed)
 Important Message  Patient Details  Name: Jay Rivas MRN: [id number] Date of Birth: 09/09/1951   Important Message Given:  Yes - Medicare IM     Jay Rivas Jay Rivas 07/29/2024, 12:50 PM

## 2024-07-29 NOTE — Plan of Care (Signed)

## 2024-07-30 LAB — CULTURE, BLOOD (ROUTINE X 2)
Culture: NO GROWTH
Culture: NO GROWTH
Special Requests: ADEQUATE

## 2024-08-02 ENCOUNTER — Telehealth: Payer: Self-pay | Admitting: Family Medicine

## 2024-08-02 NOTE — Telephone Encounter (Signed)
 Copied from CRM #[id number]. Topic: Clinical - Home Health Verbal Orders >> Aug 02, 2024 11:49 AM Darshell M wrote: Caller/Agency: Rocky Haver Callback Number: [phone] Service Requested: Physical Therapy Frequency: 2x for one week Any new concerns about the patient? Yes - Significant decline due to hospitilization

## 2024-08-03 ENCOUNTER — Telehealth: Payer: Self-pay

## 2024-08-03 NOTE — Telephone Encounter (Signed)
 Copied from CRM #[id number]. Topic: Clinical - Medical Advice >> Aug 03, 2024  2:23 PM [name] wrote: Reason for CRM: Star with [facility]- they are taking over his palliative care- discharged from [facility] 08/01/[phone]

## 2024-08-04 ENCOUNTER — Telehealth: Payer: Self-pay

## 2024-08-04 NOTE — Telephone Encounter (Signed)
 Copied from CRM [id number]. Topic: Clinical - Home Health Verbal Orders >> Aug 04, 2024  2:06 PM Carlatta H wrote: Caller/Agency: Glade Rushing Number: [phone] Service Requested: Physical Therapy Frequency: 1 a week for 4 weeks//1 every other work for 4 week Any new concerns about the patient? No

## 2024-08-11 ENCOUNTER — Telehealth: Payer: Self-pay | Admitting: Family Medicine

## 2024-08-11 NOTE — Telephone Encounter (Signed)
Stacy informed

## 2024-08-11 NOTE — Telephone Encounter (Signed)
 Copied from CRM [id number]. Topic: Clinical - Home Health Verbal Orders >> Aug 11, 2024 12:56 PM Darshell M wrote: Caller/Agency: Glade Ito [facility] Callback Number: [phone] Service Requested: Speech Therapy Frequency: Evaluation order Any new concerns about the patient? No

## 2024-08-17 ENCOUNTER — Telehealth: Payer: Self-pay

## 2024-08-17 NOTE — Telephone Encounter (Signed)
 Kristine informed.

## 2024-08-17 NOTE — Telephone Encounter (Signed)
 Copied from CRM #[id number]. Topic: Clinical - Home Health Verbal Orders >> Aug 17, 2024  1:26 PM Gustabo D wrote: Caller/Agency: [facility] Callback Number: [phone] Service Requested: Speech Therapy Frequency: one time a week for eight weeks Any new concerns about the patient? No

## 2024-08-24 ENCOUNTER — Other Ambulatory Visit: Payer: Self-pay | Admitting: Family Medicine

## 2024-08-25 ENCOUNTER — Ambulatory Visit

## 2024-08-25 ENCOUNTER — Ambulatory Visit: Admitting: Family Medicine

## 2024-08-28 ENCOUNTER — Other Ambulatory Visit: Payer: Self-pay

## 2024-08-28 ENCOUNTER — Emergency Department (HOSPITAL_COMMUNITY)
Admission: EM | Admit: 2024-08-28 | Discharge: 2024-08-28 | Disposition: A | Discharge status: Home / Self Care | Source: Skilled Nursing Facility | Attending: Emergency Medicine | Admitting: Emergency Medicine

## 2024-08-28 DIAGNOSIS — R569 Unspecified convulsions: Secondary | ICD-10-CM | POA: Diagnosis present

## 2024-08-28 DIAGNOSIS — F0390 Unspecified dementia without behavioral disturbance: Secondary | ICD-10-CM | POA: Diagnosis not present

## 2024-08-28 DIAGNOSIS — I10 Essential (primary) hypertension: Secondary | ICD-10-CM | POA: Diagnosis not present

## 2024-08-28 DIAGNOSIS — Z7982 Long term (current) use of aspirin: Secondary | ICD-10-CM | POA: Insufficient documentation

## 2024-08-28 DIAGNOSIS — G20A1 Parkinson's disease without dyskinesia, without mention of fluctuations: Secondary | ICD-10-CM | POA: Diagnosis not present

## 2024-08-28 DIAGNOSIS — Z79899 Other long term (current) drug therapy: Secondary | ICD-10-CM | POA: Insufficient documentation

## 2024-08-28 LAB — CBC WITH DIFFERENTIAL/PLATELET
Abs Immature Granulocytes: 0.04 K/uL (ref 0.00–0.07)
Basophils Absolute: 0.1 K/uL (ref 0.0–0.1)
Basophils Relative: 1 %
Eosinophils Absolute: 0.5 K/uL (ref 0.0–0.5)
Eosinophils Relative: 8 %
HCT: 39 % (ref 39.0–52.0)
Hemoglobin: 12.9 g/dL — ABNORMAL LOW (ref 13.0–17.0)
Immature Granulocytes: 1 %
Lymphocytes Relative: 32 %
Lymphs Abs: 2.3 K/uL (ref 0.7–4.0)
MCH: 30.9 pg (ref 26.0–34.0)
MCHC: 33.1 g/dL (ref 30.0–36.0)
MCV: 93.3 fL (ref 80.0–100.0)
Monocytes Absolute: 0.7 K/uL (ref 0.1–1.0)
Monocytes Relative: 10 %
Neutro Abs: 3.4 K/uL (ref 1.7–7.7)
Neutrophils Relative %: 48 %
Platelets: 160 K/uL (ref 150–400)
RBC: 4.18 MIL/uL — ABNORMAL LOW (ref 4.22–5.81)
RDW: 12.5 % (ref 11.5–15.5)
WBC: 7 K/uL (ref 4.0–10.5)
nRBC: 0 % (ref 0.0–0.2)

## 2024-08-28 LAB — COMPREHENSIVE METABOLIC PANEL WITH GFR
ALT: 25 U/L (ref 0–44)
AST: 24 U/L (ref 15–41)
Albumin: 4.2 g/dL (ref 3.5–5.0)
Alkaline Phosphatase: 71 U/L (ref 38–126)
Anion gap: 17 — ABNORMAL HIGH (ref 5–15)
BUN: 18 mg/dL (ref 8–23)
CO2: 24 mmol/L (ref 22–32)
Calcium: 9.6 mg/dL (ref 8.9–10.3)
Chloride: 101 mmol/L (ref 98–111)
Creatinine, Ser: 0.97 mg/dL (ref 0.61–1.24)
GFR, Estimated: >60 mL/min (ref >60)
Glucose, Bld: 152 mg/dL — ABNORMAL HIGH (ref 70–99)
Potassium: 3.8 mmol/L (ref 3.5–5.1)
Sodium: 142 mmol/L (ref 135–145)
Total Bilirubin: 0.4 mg/dL (ref 0.0–1.2)
Total Protein: 6.6 g/dL (ref 6.5–8.1)

## 2024-08-28 LAB — URINALYSIS, ROUTINE W REFLEX MICROSCOPIC
Bacteria, UA: NONE SEEN
Bilirubin Urine: NEGATIVE
Glucose, UA: NEGATIVE mg/dL
Hgb urine dipstick: NEGATIVE
Ketones, ur: NEGATIVE mg/dL
Leukocytes,Ua: NEGATIVE
Nitrite: NEGATIVE
Protein, ur: 100 mg/dL — AB
Specific Gravity, Urine: 1.015 (ref 1.005–1.030)
pH: 5 (ref 5.0–8.0)

## 2024-08-28 LAB — CBG MONITORING, ED: Glucose-Capillary: 159 mg/dL — ABNORMAL HIGH (ref 70–99)

## 2024-08-28 LAB — MAGNESIUM: Magnesium: 2.4 mg/dL (ref 1.7–2.4)

## 2024-08-28 LAB — VALPROIC ACID LEVEL: Valproic Acid Lvl: 13 ug/mL — ABNORMAL LOW (ref 50–100)

## 2024-08-28 LAB — PHOSPHORUS: Phosphorus: 3.8 mg/dL (ref 2.5–4.6)

## 2024-08-28 MED ORDER — SODIUM CHLORIDE 0.9 % IV BOLUS: 500.0000 mL | Freq: Once | INTRAVENOUS | Status: DC

## 2024-08-28 NOTE — ED Provider Notes (Signed)
 [place] EMERGENCY DEPARTMENT AT [facility] Provider Note   CSN: [id number] Arrival date & time: 08/28/24  9179     Patient presents with: Seizures   Jay Rivas is a 73 y.o. male.  {Add pertinent medical, surgical, social history, OB history to HPI:32947} HPI     73 y.o. male with medical history significant of dementia, Parkinson disease, hypertension, hyperlipidemia, GERD and seasonal rhinitis; who presented to the hospital from nursing home secondary to fever, general malaise and productive cough.   According to EMS, patient was walking without a walker.  He asked for help and was eased on the floor.  Thereafter patient had an episode of blank stare followed by generalized tonic-clonic activity.  He did not actually fall or hit his head.  Patient has difficulty in communicating. I called patient's son, he reports that he saw his father yday and he was fine. At baseline patient has dementia and parkinsons and it is difficult for him to communicate.  Patient's last seizure was probably in the summertime.  He also states that last admission, his Depakote was reduced 225 mg because of low blood pressure.  Patient's antihypertensives were also discontinued.  Patient has had aspiration pneumonia in the past.  Prior to Admission medications   Medication Sig Start Date End Date Taking? Authorizing Provider  aspirin  EC 81 MG tablet Take 1 tablet (81 mg total) by mouth daily. 05/12/24   Duanne Butler DASEN, MD  Cholecalciferol (QC VITAMIN D3) 25 MCG (1000 UT) tablet Take 1 tablet (1,000 Units total) by mouth daily. 05/12/24   Duanne Butler DASEN, MD  cyanocobalamin  (VITAMIN B12) 500 MCG tablet Take 1 tablet (500 mcg total) by mouth daily. 06/02/24   Duanne Butler DASEN, MD  divalproex (DEPAKOTE) 125 MG DR tablet Take 1 tablet (125 mg total) by mouth every 12 (twelve) hours. 07/29/24   Shahmehdi, Adriana LABOR, MD  donepezil  (ARICEPT ) 10 MG tablet TAKE 1 TABLET BY MOUTH EVERYDAY AT BEDTIME  05/12/24   Duanne Butler DASEN, MD  feeding supplement (ENSURE PLUS HIGH PROTEIN) LIQD Take 237 mLs by mouth 3 (three) times daily between meals. 07/29/24   Shahmehdi, Adriana LABOR, MD  fexofenadine  (ALLEGRA ) 180 MG tablet Take 1 tablet (180 mg total) by mouth daily as needed for allergies or rhinitis. 06/10/24   Duanne Butler DASEN, MD  folic acid  (FOLVITE ) 1 MG tablet Take 1 tablet (1 mg total) by mouth daily. 06/02/24   Duanne Butler DASEN, MD  ipratropium-albuterol (DUONEB) 0.5-2.5 (3) MG/3ML SOLN Take 3 mLs by nebulization every 6 (six) hours as needed. 07/29/24   Shahmehdi, Seyed A, MD  midodrine (PROAMATINE) 5 MG tablet TAKE (1) TABLET BY MOUTH (3) TIMES DAILY WITH MEALS. 08/25/24   Duanne Butler DASEN, MD  Psyllium (METAMUCIL) 0.36 g CAPS Take 0.36 g by mouth daily. 05/12/24   Duanne Butler DASEN, MD  Vitamin D-Vitamin K (VITAMIN K2 -VITAMIN D3) 90-125 MCG CAPS Take 1 capsule by mouth daily. 05/13/24   Duanne Butler DASEN, MD    Allergies: Penicillin g    Review of Systems  All other systems reviewed and are negative.   Updated Vital Signs BP 130/74   Pulse 69   Temp (!) 96.6 F (35.9 C) (Rectal)   Resp 18   Ht 6' (1.829 m)   Wt 86.2 kg   SpO2 92%   BMI 25.77 kg/m   Physical Exam Vitals and nursing note reviewed.  Constitutional:      Appearance: He is well-developed.  HENT:     Head: Atraumatic.  Cardiovascular:     Rate and Rhythm: Normal rate.  Pulmonary:     Effort: Pulmonary effort is normal.  Musculoskeletal:     Cervical back: Neck supple.  Skin:    General: Skin is warm.  Neurological:     General: No focal deficit present.     Mental Status: He is alert. Mental status is at baseline.     (all labs ordered are listed, but only abnormal results are displayed) Labs Reviewed  CBC WITH DIFFERENTIAL/PLATELET - Abnormal; Notable for the following components:      Result Value   RBC 4.18 (*)    Hemoglobin 12.9 (*)    All other components within normal limits  CBG MONITORING, ED  - Abnormal; Notable for the following components:   Glucose-Capillary 159 (*)    All other components within normal limits  URINE CULTURE  COMPREHENSIVE METABOLIC PANEL WITH GFR  MAGNESIUM  PHOSPHORUS  VALPROIC ACID LEVEL  URINALYSIS, ROUTINE W REFLEX MICROSCOPIC  CBG MONITORING, ED    EKG: None  Radiology: No results found.  {Document cardiac monitor, telemetry assessment procedure when appropriate:32947} Procedures   Medications Ordered in the ED - No data to display    {Click here for ABCD2, HEART and other calculators REFRESH Note before signing:1}                              Medical Decision Making Amount and/or Complexity of Data Reviewed Labs: ordered.   ***-year-old patient comes in with chief complaint of seizure-like activity. Patient has pertinent past medical history of *** Collateral history provided by ***  Differential diagnosis considered for this patient includes -Seizure disorder with breakthrough seizure - Infection including meningitis and encephalitis -Traumatic brain injury including intracranial hemorrhage -Severe electrolyte abnormality -Metabolic derangement -Stroke -Toxin induced seizures -Medication side effects -Hypoxia -Hypoglycemia - Withdrawal seizure  Initial plan is to get basic labs, initiate seizure precautions and monitor the patient in the ER for extended period of time. Additionally, plan is to ***   Final diagnoses:  None    ED Discharge Orders     None

## 2024-08-28 NOTE — ED Triage Notes (Signed)
 Pt was walking in facility and then started going to the floor and staff helped pt to ground. Staff said pt stared and stiff for about 2 minutes ans shaking. Pt is Incontinent basline. [phone]

## 2024-08-28 NOTE — Discharge Instructions (Addendum)
 We saw Jay Rivas in the ER for seizure like activity.  The workup in the ER is overall reassuring. We are not sure what is causing your symptoms.  The workup in the ER is not complete, and is limited to screening for life threatening and emergent conditions only, so please see primary neurologist for further evaluation.  Return to the ER if there is any seizure like activity.  Monitor him every 4-6 hours for the next 2 days.

## 2024-08-28 NOTE — ED Notes (Signed)
 Family states patient's neuro results are baseline.

## 2024-08-29 LAB — URINE CULTURE: Culture: NO GROWTH

## 2024-09-01 DIAGNOSIS — F0284 Dementia in other diseases classified elsewhere, unspecified severity, with anxiety: Secondary | ICD-10-CM | POA: Diagnosis not present

## 2024-09-01 DIAGNOSIS — M65311 Trigger thumb, right thumb: Secondary | ICD-10-CM

## 2024-09-01 DIAGNOSIS — D509 Iron deficiency anemia, unspecified: Secondary | ICD-10-CM

## 2024-09-01 DIAGNOSIS — G5600 Carpal tunnel syndrome, unspecified upper limb: Secondary | ICD-10-CM

## 2024-09-01 DIAGNOSIS — I2510 Atherosclerotic heart disease of native coronary artery without angina pectoris: Secondary | ICD-10-CM

## 2024-09-01 DIAGNOSIS — F02811 Dementia in other diseases classified elsewhere, unspecified severity, with agitation: Secondary | ICD-10-CM | POA: Diagnosis not present

## 2024-09-01 DIAGNOSIS — E785 Hyperlipidemia, unspecified: Secondary | ICD-10-CM

## 2024-09-01 DIAGNOSIS — G3189 Other specified degenerative diseases of nervous system: Secondary | ICD-10-CM | POA: Diagnosis not present

## 2024-09-01 DIAGNOSIS — G9341 Metabolic encephalopathy: Secondary | ICD-10-CM

## 2024-09-01 DIAGNOSIS — R32 Unspecified urinary incontinence: Secondary | ICD-10-CM

## 2024-09-01 DIAGNOSIS — G20B1 Parkinson's disease with dyskinesia, without mention of fluctuations: Secondary | ICD-10-CM | POA: Diagnosis not present

## 2024-09-01 DIAGNOSIS — I10 Essential (primary) hypertension: Secondary | ICD-10-CM

## 2024-09-23 ENCOUNTER — Other Ambulatory Visit: Payer: Self-pay | Admitting: Family Medicine

## 2024-09-26 ENCOUNTER — Telehealth: Payer: Self-pay

## 2024-09-26 NOTE — Telephone Encounter (Unsigned)
 Copied from CRM #[id number]. Topic: Clinical - Medication Question >> Sep 26, 2024 10:38 AM Olam RAMAN wrote: Reason for CRM: [facility]isa from [facility].  Divalproex  Sodium (125 mg Oral Every 12 hours) [phone] wanted to make sure refill was taken care of

## 2024-09-27 ENCOUNTER — Other Ambulatory Visit: Payer: Self-pay | Admitting: Family Medicine

## 2024-09-27 MED ORDER — DIVALPROEX SODIUM 125 MG PO DR TAB: 125.0000 mg | DELAYED_RELEASE_TABLET | Freq: Two times a day (BID) | ORAL | 5 refills | Status: AC

## 2024-10-24 ENCOUNTER — Other Ambulatory Visit: Payer: Self-pay | Admitting: Family Medicine

## 2024-10-31 ENCOUNTER — Ambulatory Visit: Payer: Self-pay

## 2024-10-31 ENCOUNTER — Ambulatory Visit: Admitting: Family Medicine

## 2024-10-31 NOTE — Telephone Encounter (Signed)
 FYI Only or Action Required?: FYI only for provider: appointment scheduled on today.  Patient was last seen in primary care on 05/10/2024 by Jay Butler DASEN, MD.  Called Nurse Triage reporting Shortness of Breath and Hallucinations.  Symptoms began Son is unsure.  Interventions attempted: Nothing.  Symptoms are: gradually worsening.  Triage Disposition: See Physician Within 24 Hours  Patient/caregiver understands and will follow disposition?: Yes                     Copied from CRM [id number]. Topic: Clinical - Red Word Triage >> Oct 31, 2024  8:57 AM Tobias CROME wrote: Red Word that prompted transfer to Nurse Triage: vision issues, confusion, SOB Reason for Disposition  [1] Hallucinations AND [2] NEW-ONSET  Answer Assessment - Initial Assessment Questions 1. MAIN CONCERN: What happened that made you call today?     hallucinations 2. DESCRIPTION: What are the hallucinations like? Describe them for me. (e.g., auditory, visual, tactile; worsening; threatening) If auditory, ask Are they telling you to hurt yourself or someone else?     Seeing 2 boys 3. ONSET: When did this start? (e.g., hours, days, months)     Unsure - may be up to 2 weeks 4. PATTERN: Do they come and go, or are they present all the time?  Are they present now?     unsure 5. PRIOR HALLUCINATIONS: Have you had hallucinations in the past? (e.g., same, different, worse)     Last spring - 8-9 months ago 6. MENTAL HEALTH HISTORY: Have you ever been diagnosed with schizophrenia or any other mental health problem? (e.g., bipolar disorder, schizophrenia; dementia, Parkinsons)     Pt has dementia 7. MENTAL HEALTH MEDICINES: Are you taking any medicines for depression or other mental health problems? (e.g., psychiatric medicines; sleeping pills)     Yes - recently increased dose of of seizure medication 8. ALCOHOL  or DRUG ABUSE: Have you been drinking alcohol  or taking any drugs? Have you  recently stopped drinking alcohol  or using drugs?     Did not ask 9. MEDICINE CHANGES: Did you recently stop taking a medicine? (e.g., antidepressant, barbiturates, benzodiazepine, gabapentin)  Did you recently start a new medicine or increase the dose? (e.g., antidepressant, digoxin, sleep medicine, steroid, Parkinson's medicine)     Yes increased. 10. SUPPORT: Is there anyone else with you?       Lives at an assisted living 29. STRESS: Are you experiencing any particular stressors?       no 12. OTHER SYMPTOMS: Are there any other symptoms? (e.g., difficulty breathing, headache, fever, weakness)       Vision is getting worse, SOB  Protocols used: Hallucinations-A-AH

## 2024-11-15 ENCOUNTER — Other Ambulatory Visit: Payer: Self-pay | Admitting: Family Medicine

## 2024-11-15 DIAGNOSIS — G20C Parkinsonism, unspecified: Secondary | ICD-10-CM

## 2024-11-20 ENCOUNTER — Emergency Department (HOSPITAL_COMMUNITY)

## 2024-11-20 ENCOUNTER — Other Ambulatory Visit: Payer: Self-pay

## 2024-11-20 ENCOUNTER — Encounter (HOSPITAL_COMMUNITY): Payer: Self-pay

## 2024-11-20 ENCOUNTER — Observation Stay (HOSPITAL_COMMUNITY)
Admission: EM | Admit: 2024-11-20 | Discharge: 2024-11-20 | Disposition: A | Discharge status: Home / Self Care | Source: Skilled Nursing Facility | Attending: Internal Medicine | Admitting: Internal Medicine

## 2024-11-20 DIAGNOSIS — I10 Essential (primary) hypertension: Secondary | ICD-10-CM | POA: Insufficient documentation

## 2024-11-20 DIAGNOSIS — G20C Parkinsonism, unspecified: Principal | ICD-10-CM | POA: Insufficient documentation

## 2024-11-20 DIAGNOSIS — E785 Hyperlipidemia, unspecified: Secondary | ICD-10-CM | POA: Diagnosis not present

## 2024-11-20 DIAGNOSIS — R414 Neurologic neglect syndrome: Secondary | ICD-10-CM | POA: Insufficient documentation

## 2024-11-20 DIAGNOSIS — Z79899 Other long term (current) drug therapy: Secondary | ICD-10-CM | POA: Diagnosis not present

## 2024-11-20 DIAGNOSIS — G20A1 Parkinson's disease without dyskinesia, without mention of fluctuations: Principal | ICD-10-CM | POA: Insufficient documentation

## 2024-11-20 DIAGNOSIS — I252 Old myocardial infarction: Secondary | ICD-10-CM | POA: Insufficient documentation

## 2024-11-20 DIAGNOSIS — G253 Myoclonus: Secondary | ICD-10-CM | POA: Diagnosis present

## 2024-11-20 DIAGNOSIS — Z87891 Personal history of nicotine dependence: Secondary | ICD-10-CM | POA: Diagnosis not present

## 2024-11-20 DIAGNOSIS — T424X5A Adverse effect of benzodiazepines, initial encounter: Secondary | ICD-10-CM | POA: Diagnosis not present

## 2024-11-20 DIAGNOSIS — T50905A Adverse effect of unspecified drugs, medicaments and biological substances, initial encounter: Secondary | ICD-10-CM

## 2024-11-20 DIAGNOSIS — R531 Weakness: Principal | ICD-10-CM

## 2024-11-20 DIAGNOSIS — I2510 Atherosclerotic heart disease of native coronary artery without angina pectoris: Secondary | ICD-10-CM | POA: Diagnosis not present

## 2024-11-20 DIAGNOSIS — G3185 Corticobasal degeneration: Secondary | ICD-10-CM | POA: Diagnosis not present

## 2024-11-20 DIAGNOSIS — M4802 Spinal stenosis, cervical region: Secondary | ICD-10-CM | POA: Insufficient documentation

## 2024-11-20 DIAGNOSIS — F0280 Dementia in other diseases classified elsewhere without behavioral disturbance: Secondary | ICD-10-CM | POA: Insufficient documentation

## 2024-11-20 DIAGNOSIS — J320 Chronic maxillary sinusitis: Secondary | ICD-10-CM | POA: Diagnosis not present

## 2024-11-20 DIAGNOSIS — F0390 Unspecified dementia without behavioral disturbance: Secondary | ICD-10-CM | POA: Diagnosis present

## 2024-11-20 DIAGNOSIS — R419 Unspecified symptoms and signs involving cognitive functions and awareness: Secondary | ICD-10-CM

## 2024-11-20 HISTORY — DX: Neurologic neglect syndrome: R41.4

## 2024-11-20 HISTORY — DX: Parkinson's disease without dyskinesia, without mention of fluctuations: G20.A1

## 2024-11-20 LAB — LACTIC ACID, PLASMA: Lactic Acid, Venous: 0.8 mmol/L (ref 0.5–1.9)

## 2024-11-20 LAB — COMPREHENSIVE METABOLIC PANEL WITH GFR
ALT: 16 U/L (ref 0–44)
AST: 19 U/L (ref 15–41)
Albumin: 4.4 g/dL (ref 3.5–5.0)
Alkaline Phosphatase: 88 U/L (ref 38–126)
Anion gap: 13 (ref 5–15)
BUN: 17 mg/dL (ref 8–23)
CO2: 26 mmol/L (ref 22–32)
Calcium: 9.8 mg/dL (ref 8.9–10.3)
Chloride: 101 mmol/L (ref 98–111)
Creatinine, Ser: 0.88 mg/dL (ref 0.61–1.24)
GFR, Estimated: >60 mL/min
Glucose, Bld: 93 mg/dL (ref 70–99)
Potassium: 4.2 mmol/L (ref 3.5–5.1)
Sodium: 141 mmol/L (ref 135–145)
Total Bilirubin: 0.5 mg/dL (ref 0.0–1.2)
Total Protein: 7 g/dL (ref 6.5–8.1)

## 2024-11-20 LAB — DIFFERENTIAL
Abs Immature Granulocytes: 0.02 10*3/uL (ref 0.00–0.07)
Basophils Absolute: 0 10*3/uL (ref 0.0–0.1)
Basophils Relative: 1 %
Eosinophils Absolute: 0.4 10*3/uL (ref 0.0–0.5)
Eosinophils Relative: 8 %
Immature Granulocytes: 0 %
Lymphocytes Relative: 19 %
Lymphs Abs: 1 10*3/uL (ref 0.7–4.0)
Monocytes Absolute: 0.5 10*3/uL (ref 0.1–1.0)
Monocytes Relative: 9 %
Neutro Abs: 3.3 10*3/uL (ref 1.7–7.7)
Neutrophils Relative %: 63 %

## 2024-11-20 LAB — CBG MONITORING, ED: Glucose-Capillary: 97 mg/dL (ref 70–99)

## 2024-11-20 LAB — URINALYSIS, ROUTINE W REFLEX MICROSCOPIC
Bilirubin Urine: NEGATIVE
Glucose, UA: NEGATIVE mg/dL
Hgb urine dipstick: NEGATIVE
Ketones, ur: NEGATIVE mg/dL
Leukocytes,Ua: NEGATIVE
Nitrite: NEGATIVE
Protein, ur: NEGATIVE mg/dL
Specific Gravity, Urine: 1.017 (ref 1.005–1.030)
pH: 5 (ref 5.0–8.0)

## 2024-11-20 LAB — CBC
HCT: 41.5 % (ref 39.0–52.0)
Hemoglobin: 14.4 g/dL (ref 13.0–17.0)
MCH: 31.2 pg (ref 26.0–34.0)
MCHC: 34.7 g/dL (ref 30.0–36.0)
MCV: 89.8 fL (ref 80.0–100.0)
Platelets: 197 10*3/uL (ref 150–400)
RBC: 4.62 MIL/uL (ref 4.22–5.81)
RDW: 12.2 % (ref 11.5–15.5)
WBC: 5.2 10*3/uL (ref 4.0–10.5)
nRBC: 0 % (ref 0.0–0.2)

## 2024-11-20 LAB — RESP PANEL BY RT-PCR (RSV, FLU A&B, COVID)  RVPGX2
Influenza A by PCR: NEGATIVE
Influenza B by PCR: NEGATIVE
Resp Syncytial Virus by PCR: NEGATIVE
SARS Coronavirus 2 by RT PCR: NEGATIVE

## 2024-11-20 LAB — VALPROIC ACID LEVEL: Valproic Acid Lvl: 14 ug/mL — ABNORMAL LOW (ref 50–100)

## 2024-11-20 MED ORDER — ONDANSETRON HCL 4 MG PO TABS: 4.0000 mg | ORAL_TABLET | Freq: Four times a day (QID) | ORAL | Status: DC | PRN

## 2024-11-20 MED ORDER — ACETAMINOPHEN 325 MG PO TABS: 650.0000 mg | ORAL_TABLET | Freq: Four times a day (QID) | ORAL | Status: DC | PRN

## 2024-11-20 MED ORDER — BISACODYL 5 MG PO TBEC: 5.0000 mg | DELAYED_RELEASE_TABLET | Freq: Every day | ORAL | Status: DC | PRN

## 2024-11-20 MED ORDER — SODIUM CHLORIDE 0.9 % IV BOLUS
1000.0000 mL | Freq: Once | INTRAVENOUS | Status: AC
  Administered 2024-11-20: 1000 mL via INTRAVENOUS

## 2024-11-20 MED ORDER — ENOXAPARIN SODIUM 40 MG/0.4ML IJ SOSY: 40.0000 mg | PREFILLED_SYRINGE | INTRAMUSCULAR | Status: DC

## 2024-11-20 MED ORDER — ONDANSETRON HCL 4 MG/2ML IJ SOLN: 4.0000 mg | Freq: Four times a day (QID) | INTRAMUSCULAR | Status: DC | PRN

## 2024-11-20 MED ORDER — ACETAMINOPHEN 650 MG RE SUPP: 650.0000 mg | Freq: Four times a day (QID) | RECTAL | Status: DC | PRN

## 2024-11-20 MED ORDER — SODIUM CHLORIDE 0.9% FLUSH: 3.0000 mL | Freq: Two times a day (BID) | INTRAVENOUS | Status: DC

## 2024-11-20 NOTE — Discharge Instructions (Addendum)
 It was our pleasure to provide your ER care today - we hope that you feel better.  Stop the valium medication.  Follow up  closely with your doctor/neurologist in the next 2-3 days - call Monday AM to arrange appointment.   Return to ER if worse, new symptoms, fevers, chest pain, trouble breathing, or other concern.

## 2024-11-20 NOTE — ED Triage Notes (Addendum)
 PER EMS: pt is from [facility] with c/o weakness and lethargy, as well as trouble swallowing and shallow breathing. Staff reports he is usually able to get up and walk around but has been unable to all week, has gotten progressively weaker to the point to where they said he was dead weight this morning. + wet cough. Hx of Parkinsons.  BP- 11/75, HR-67, O2-95% RA

## 2024-11-20 NOTE — ED Provider Notes (Signed)
 Pt's son and POA Isaiyah Feldhaus called and indicates he does not want patient to be admitted. He indicates he has spoken with other sibling(s) and are in agreement that patient would not want to be admitted - he indicates he will have facility hold/stop the valium and indicates he will arrange close follow up with patients doctor/neurologist in the next 1-2 days. He indicates facility is very good at working with patient, and that patients docs and facility know him well, and provide him very good care.  Pt alert, content, vitals normal. Discussed conversation with son and patient appears to indicate 'yes' that he understands and is ok with returning to facility.     Bernard Drivers, MD 11/20/24 1556

## 2024-11-20 NOTE — H&P (Signed)
 " History and Physical    Patient: Jay Rivas FMW:[id number] DOB: August 05, 1951 DOA: 11/20/2024 DOS: the patient was seen and examined on 11/20/2024 PCP: Duanne Butler DASEN, MD  Patient coming from: group home  Chief Complaint:  Chief Complaint  Patient presents with   Weakness   HPI: Jay Rivas is a 74 y.o. male with medical history significant for Parkinson's disease, dementia, hypertension, and hyperlipidemia sent into the ER today because the patient is unable to walk.  He has been getting progressively weaker this morning in his group home he could not stay and they could not get him out of bed.  That is not his baseline.  All of my history comes from the ED provider as the patient is not able to provide any.  Apparently the patient was started on Valium 5 mg twice daily 48 hours ago.  He was already having trouble with weakness before the Valium was started.  His evaluation in the emergency department included basic blood work which showed normal white count, normal creatinine, normal electrolytes.  He was afebrile. UA was within normal limits. His COVID, flu, and RSV tests were negative.  No evidence of infection.  He had a head CT which was on remarkable for acute changes.  But the patient was less alert and too weak to stand. He is going admitted to the hospitalist service to hold his Valium and reevaluate.  Review of Systems: unable to review all systems due to the inability of the patient to answer questions. 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 Levern   Spinal stenosis of cervical region    Past Surgical History:  Procedure Laterality Date   ABDOMINAL SURGERY      CARDIAC CATHETERIZATION  2005   stent   CARPAL TUNNEL RELEASE Right    CARPAL TUNNEL RELEASE Right 03/09/2018   Procedure: RIGHT CARPAL TUNNEL RELEASE;  Surgeon: Murrell Arley, MD;  Location: [facility];  Service: Orthopedics;  Laterality: Right;   CARPAL TUNNEL RELEASE Left 11/02/2018   Procedure: LEFT CARPAL TUNNEL RELEASE;  Surgeon: Murrell Arley, MD;  Location: [facility];  Service: Orthopedics;  Laterality: Left;   TENDON TRANSFER Left 11/02/2018   Procedure: LEFT CAMITZ TRANSFER;  Surgeon: Murrell Arley, MD;  Location: [facility];  Service: Orthopedics;  Laterality: Left;   TRIGGER FINGER RELEASE Right 03/09/2018   Procedure: RELEASE TRIGGER FINGER/A-1 PULLEY RIGHT MIDDLE, RING AND SMALL FINGERS;  Surgeon: Murrell Arley, MD;  Location: [facility];  Service: Orthopedics;  Laterality: Right;   ULNAR NERVE TRANSPOSITION Left 11/02/2018   Procedure: LEFT ULNAR NERVE DECOMPRESSION;  Surgeon: Murrell Arley, MD;  Location: [facility];  Service: Orthopedics;  Laterality: Left;   ULNAR TUNNEL RELEASE Right 03/09/2018   Procedure: CUBITAL TUNNEL DECOMPRESSION RIGHT ELBOW;  Surgeon: Murrell Arley, MD;  Location: [facility];  Service: Orthopedics;  Laterality: Right;   Social History:  reports that he has quit smoking. He has been exposed to tobacco smoke. He has quit using smokeless tobacco. He reports that he does not currently use alcohol  after  a past usage of about 24.0 standard drinks of alcohol  per week. He reports that he does not use drugs.  Allergies[1]  History reviewed. No pertinent family history.  Prior to Admission medications  Medication Sig Start Date End Date Taking? Authorizing Provider  aspirin  EC 81 MG tablet Take 1 tablet (81 mg total) by mouth daily. 05/12/24   Duanne Butler DASEN, MD  Cholecalciferol (QC VITAMIN D3) 25 MCG (1000 UT) tablet Take 1 tablet (1,000 Units total) by mouth daily. 05/12/24   Duanne Butler DASEN, MD  cyanocobalamin  (VITAMIN B12) 500 MCG tablet Take 1 tablet (500 mcg total) by mouth daily. 06/02/24   Duanne Butler DASEN, MD  divalproex  (DEPAKOTE ) 125 MG DR tablet Take 1 tablet (125 mg total) by mouth every 12 (twelve) hours. 09/27/24   Duanne Butler DASEN, MD  donepezil  (ARICEPT ) 10 MG tablet TAKE 1 TABLET BY MOUTH EVERYDAY AT BEDTIME 05/12/24   Duanne Butler DASEN, MD  feeding supplement (ENSURE PLUS HIGH PROTEIN) LIQD Take 237 mLs by mouth 3 (three) times daily between meals. 07/29/24   Shahmehdi, Adriana LABOR, MD  fexofenadine  (ALLEGRA ) 180 MG tablet Take 1 tablet (180 mg total) by mouth daily as needed for allergies or rhinitis. 06/10/24   Duanne Butler DASEN, MD  folic acid  (FOLVITE ) 1 MG tablet Take 1 tablet (1 mg total) by mouth daily. 06/02/24   Duanne Butler DASEN, MD  ipratropium-albuterol  (DUONEB) 0.5-2.5 (3) MG/3ML SOLN Take 3 mLs by nebulization every 6 (six) hours as needed. 07/29/24   Shahmehdi, Adriana LABOR, MD  midodrine  (PROAMATINE ) 5 MG tablet TAKE (1) TABLET BY MOUTH (3) TIMES DAILY WITH MEALS. 10/24/24   Duanne Butler DASEN, MD  Psyllium (METAMUCIL) 0.36 g CAPS CHEW AND SWALLOW 1 GUMMY BY MOUTH DAILY. 11/15/24   Duanne Butler DASEN, MD  Vitamin D-Vitamin K (VITAMIN K2 -VITAMIN D3) 90-125 MCG CAPS Take 1 capsule by mouth daily. 05/13/24   Duanne Butler DASEN, MD    Physical Exam: Vitals:   11/20/24 1230 11/20/24 1300 11/20/24 1330 11/20/24 1345  BP: 126/71 137/75 138/80   Pulse: (!) 55 (!) 53 60   Resp: 13 12 15    Temp:      TempSrc:      SpO2: 95% 97%  97%   Physical Exam:  General: No acute distress, frail HEENT: Normocephalic, atraumatic, PERRL Cardiovascular: Normal rate and rhythm. Distal pulses intact. Pulmonary: Normal pulmonary effort, normal breath sounds Gastrointestinal: Nondistended abdomen, soft, non-tender, normoactive bowel sounds Musculoskeletal:Normal ROM, no lower ext edema Lymphadenopathy: No cervical LAD. Skin: Skin is warm and dry. Neuro: follows simple commands.  Frequent myoclonus most prominent in arms.  When I walked into the room he was staring into space with his mouth open then he chuckled as if he was having conversation with someone.  I walked in the room but he did not actually look at me until I tapped him on his chest and called his name.  I asked if he was hurting he said no.  His speech is slightly slurred. I asked if he wanted something to eat and he gave an emphatic yes. He was not moving any extremities spontaneously but he did shake my hand when I put my hand into his. PSYCH: Awake but does not follow you with his eyes in the room.  He will make eye contact if you put your face directly in front of his line of sight.  Data Reviewed:  Results for orders placed or performed during the hospital encounter of 11/20/24 (  from the past 24 hours)  Resp panel by RT-PCR (RSV, Flu A&B, Covid) Anterior Nasal Swab     Status: None   Collection Time: 11/20/24 11:20 AM   Specimen: Anterior Nasal Swab  Result Value Ref Range   SARS Coronavirus 2 by RT PCR NEGATIVE NEGATIVE   Influenza A by PCR NEGATIVE NEGATIVE   Influenza B by PCR NEGATIVE NEGATIVE   Resp Syncytial Virus by PCR NEGATIVE NEGATIVE  CBG monitoring, ED     Status: None   Collection Time: 11/20/24 11:21 AM  Result Value Ref Range   Glucose-Capillary 97 70 - 99 mg/dL  Comprehensive metabolic panel     Status: None   Collection Time: 11/20/24 11:28 AM  Result Value Ref Range   Sodium 141 135 - 145 mmol/L   Potassium 4.2 3.5 - 5.1 mmol/L   Chloride 101 98 - 111 mmol/L   CO2 26 22 - 32 mmol/L   Glucose, Bld 93 70 - 99 mg/dL   BUN 17 8 - 23 mg/dL   Creatinine, Ser 9.11 0.61 - 1.24 mg/dL   Calcium  9.8 8.9 - 10.3 mg/dL   Total Protein 7.0 6.5 - 8.1 g/dL   Albumin 4.4 3.5 - 5.0 g/dL   AST 19 15 - 41 U/L   ALT 16 0 - 44 U/L   Alkaline Phosphatase 88 38 - 126 U/L   Total Bilirubin 0.5 0.0 - 1.2 mg/dL   GFR, Estimated >39 >39 mL/min   Anion gap 13 5 - 15  CBC     Status: None    Collection Time: 11/20/24 11:28 AM  Result Value Ref Range   WBC 5.2 4.0 - 10.5 K/uL   RBC 4.62 4.22 - 5.81 MIL/uL   Hemoglobin 14.4 13.0 - 17.0 g/dL   HCT 58.4 60.9 - 47.9 %   MCV 89.8 80.0 - 100.0 fL   MCH 31.2 26.0 - 34.0 pg   MCHC 34.7 30.0 - 36.0 g/dL   RDW 87.7 88.4 - 84.4 %   Platelets 197 150 - 400 K/uL   nRBC 0.0 0.0 - 0.2 %  Lactic acid, plasma     Status: None   Collection Time: 11/20/24 11:38 AM  Result Value Ref Range   Lactic Acid, Venous 0.8 0.5 - 1.9 mmol/L  Urinalysis, Routine w reflex microscopic -Urine, Clean Catch     Status: None   Collection Time: 11/20/24 11:53 AM  Result Value Ref Range   Color, Urine YELLOW YELLOW   APPearance CLEAR CLEAR   Specific Gravity, Urine 1.017 1.005 - 1.030   pH 5.0 5.0 - 8.0   Glucose, UA NEGATIVE NEGATIVE mg/dL   Hgb urine dipstick NEGATIVE NEGATIVE   Bilirubin Urine NEGATIVE NEGATIVE   Ketones, ur NEGATIVE NEGATIVE mg/dL   Protein, ur NEGATIVE NEGATIVE mg/dL   Nitrite NEGATIVE NEGATIVE   Leukocytes,Ua NEGATIVE NEGATIVE  Valproic  acid level     Status: Abnormal   Collection Time: 11/20/24  1:41 PM  Result Value Ref Range   Valproic  Acid Lvl 14 (L) 50 - 100 ug/mL     Assessment and Plan: 1. Patient can no longer walk/ weakness - Will discontinue the Valium started 48 hours ago and reevaluate.  There is no sign of significant infection or metabolic abnormality. - PT/OT - review medication list once its verified  2.  Neurocognitive disorder/Parkinson's/dementia/ seizure like disorder - Resume baseline meds  3.  Chronic maxillary sinusitis - Continue Allegra . I do not think this is impacting his gait.  Advance Care Planning:   Code Status: Limited: Do not attempt resuscitation (DNR) -DNR-LIMITED -Do Not Intubate/DNI the patient has a yellow out of facility DNR form at the bedside.  Consults: none  Family Communication: none  Severity of Illness: The appropriate patient status for this patient is OBSERVATION.  Observation status is judged to be reasonable and necessary in order to provide the required intensity of service to ensure the patient's safety. The patient's presenting symptoms, physical exam findings, and initial radiographic and laboratory data in the context of their medical condition is felt to place them at decreased risk for further clinical deterioration. Furthermore, it is anticipated that the patient will be medically stable for discharge from the hospital within 2 midnights of admission.   Author: ARTHEA CHILD, MD 11/20/2024 3:04 PM  For on call review [email or web address].     [1]  Allergies Allergen Reactions   Penicillin G Anaphylaxis   "

## 2024-11-20 NOTE — Consult Note (Addendum)
 [facility] Telemedicine Consult   Requesting Provider: Suzette Consult Participants: nurse Location of the provider: [facility] Location of the patient: [facility]  This consult was provided via telemedicine with 2-way video and audio communication. The patient/family was informed that care would be provided in this way and agreed to receive care in this manner.    Chief Complaint: Weakness  HPI: 74 year old male with a history of alien hand syndrome (right arm), dementia, HLD, HTN, CAD s/p MI, corticobasilar degeneration and cervical spinal stenosis who presents with complaints of progressive weakness.  Patient unable to provide history therefore all history obtained from the chart.  It appears the patient has been having more difficulty walking for about a week.  Valium 5 mg twice a day was started on Friday for tremor in his arm and his symptoms have progressed significantly since then. He is usually able to ambulate on his own.  Is now unable to bear weight.  Unclear if he is incontinent of bowel or bladder but patient is wearing a pull up.   Time of teleneurologist evaluation: 1418  Exam: Vitals:   11/20/24 1330 11/20/24 1345  BP: 138/80   Pulse: 60   Resp: 15   Temp:    SpO2:  97%    General: NAD  Neurological Examination   Mental Status: Alert.  Speech minimal.  Follows simple commands Cranial Nerves: II: Visual fields grossly normal III,IV, VI: extra-ocular motions grossly intact bilaterally V,VII: face smile symmetric, facial light touch sensation normal bilaterally Motor: Generalized weakness.  Able to maintain both arms against gravity.  Coarse movements noted in the RUE, tremor noted in the LUE.  Lifts RLE less well than the right.   Sensory: Pinprick and light touch grossly intact bilaterally   Imaging Reviewed:  CT HEAD WITHOUT CONTRAST 11/20/2024 12:29:03 PM   TECHNIQUE: CT of the head was performed without the administration of intravenous contrast.  Automated exposure control, iterative reconstruction, and/or weight based adjustment of the mA/kV was utilized to reduce the radiation dose to as low as reasonably achievable.   COMPARISON: Brain MRI 07/28/2024, CT head 05/04/2024.   CLINICAL HISTORY: 74 year old male with aggressive weakness over the past week, productive cough, headache, fever, and neurological deficit.   FINDINGS:   BRAIN AND VENTRICLES: No acute hemorrhage. No evidence of acute infarct. No extra-axial collection. No mass effect or midline shift. Stable brain volume since last year. Specific ventriculomegaly with Evans Index 0.39 and closed angle 82 degrees. Gray white differentiation appears stable and within normal limits for age. No regional disproportionate brain atrophy is identified. No focal encephalomalacia. No suspicious intracranial vascular hyperdensity.   ORBITS: No gaze deviation.   SINUSES: Bilateral paranasal sinus mucosal thickening and scattered small volume layering fluid.   SOFT TISSUES AND SKULL: No acute soft tissue abnormality. No skull fracture. Calcified atherosclerosis at the skull base. Tympanic cavities and mastoids appear clear.   IMPRESSION: 1. No acute intracranial abnormality. Stable chronic nonspecific volume loss and ventriculomegaly (Evans index 0.39 and callosal angle 82) 2. Bilateral Paranasal sinus inflammation.    Assessment: 74 year old male with a history of alien hand syndrome (right arm), dementia, HLD, HTN, CAD s/p MI, corticobasilar degeneration and cervical spinal stenosis who presents with complaints of progressive weakness.  This weakness seems to be worse since the initiation of Valium.  It seems some decline was noted prior to this as well though and can not rule out progression of his corticobasliar degeneration and/or worsening of his  cervical spinal stenosis.  Head CT personally reviewed and shows no acute changes.    Recommendations:  Discontinue  Valium CT of the cervical spine without contrast PT/OT consults Will continue to follow with you  Case discussed with Dr. Suzette  This patient is receiving care for possible acute neurological changes. Care by this provider at the time of service included time for direct evaluation via telemedicine, review of medical records, imaging studies and discussion of findings with providers, the patient and/or family.  Sonny Hock, MD Neurology   If 8pm- 8am, please page neurology on call as listed in [facility].

## 2024-11-24 ENCOUNTER — Other Ambulatory Visit: Payer: Self-pay | Admitting: Family Medicine

## 2024-11-24 DIAGNOSIS — E785 Hyperlipidemia, unspecified: Secondary | ICD-10-CM

## 2024-11-24 DIAGNOSIS — G629 Polyneuropathy, unspecified: Secondary | ICD-10-CM

## 2024-11-24 DIAGNOSIS — G3185 Corticobasal degeneration: Secondary | ICD-10-CM

## 2024-11-24 DIAGNOSIS — I10 Essential (primary) hypertension: Secondary | ICD-10-CM
